# Patient Record
Sex: Female | Born: 1950 | Race: Black or African American | Hispanic: No | Marital: Single | Smoking: Former smoker
Health system: Southern US, Community
[De-identification: ages and names within clinical notes are randomized; demographics above are authoritative.]

## PROBLEM LIST (undated history)

## (undated) DIAGNOSIS — M24576 Contracture, unspecified foot: Secondary | ICD-10-CM

## (undated) DIAGNOSIS — E872 Acidosis, unspecified: Secondary | ICD-10-CM

## (undated) DIAGNOSIS — J189 Pneumonia, unspecified organism: Secondary | ICD-10-CM

## (undated) DIAGNOSIS — F32A Depression, unspecified: Secondary | ICD-10-CM

## (undated) DIAGNOSIS — G35 Multiple sclerosis: Secondary | ICD-10-CM

## (undated) DIAGNOSIS — M6281 Muscle weakness (generalized): Secondary | ICD-10-CM

## (undated) DIAGNOSIS — I639 Cerebral infarction, unspecified: Secondary | ICD-10-CM

## (undated) DIAGNOSIS — E860 Dehydration: Secondary | ICD-10-CM

## (undated) DIAGNOSIS — M24573 Contracture, unspecified ankle: Secondary | ICD-10-CM

## (undated) DIAGNOSIS — E669 Obesity, unspecified: Secondary | ICD-10-CM

## (undated) DIAGNOSIS — L89153 Pressure ulcer of sacral region, stage 3: Secondary | ICD-10-CM

## (undated) DIAGNOSIS — R4701 Aphasia: Secondary | ICD-10-CM

## (undated) DIAGNOSIS — Z931 Gastrostomy status: Secondary | ICD-10-CM

## (undated) DIAGNOSIS — G8194 Hemiplegia, unspecified affecting left nondominant side: Secondary | ICD-10-CM

## (undated) DIAGNOSIS — K219 Gastro-esophageal reflux disease without esophagitis: Secondary | ICD-10-CM

## (undated) DIAGNOSIS — I10 Essential (primary) hypertension: Secondary | ICD-10-CM

## (undated) DIAGNOSIS — F329 Major depressive disorder, single episode, unspecified: Secondary | ICD-10-CM

## (undated) DIAGNOSIS — G894 Chronic pain syndrome: Secondary | ICD-10-CM

## (undated) DIAGNOSIS — R1310 Dysphagia, unspecified: Secondary | ICD-10-CM

## (undated) DIAGNOSIS — D696 Thrombocytopenia, unspecified: Secondary | ICD-10-CM

## (undated) DIAGNOSIS — E039 Hypothyroidism, unspecified: Secondary | ICD-10-CM

## (undated) DIAGNOSIS — R569 Unspecified convulsions: Secondary | ICD-10-CM

## (undated) DIAGNOSIS — R279 Unspecified lack of coordination: Secondary | ICD-10-CM

---

## 1997-11-15 ENCOUNTER — Inpatient Hospital Stay (HOSPITAL_COMMUNITY): Admission: EM | Admit: 1997-11-15 | Discharge: 1997-12-04 | Payer: Self-pay | Admitting: Neurology

## 2001-11-22 ENCOUNTER — Emergency Department (HOSPITAL_COMMUNITY): Admission: EM | Admit: 2001-11-22 | Discharge: 2001-11-22 | Payer: Self-pay | Admitting: Emergency Medicine

## 2003-07-25 ENCOUNTER — Emergency Department (HOSPITAL_COMMUNITY): Admission: EM | Admit: 2003-07-25 | Discharge: 2003-07-25 | Payer: Self-pay | Admitting: Emergency Medicine

## 2003-10-09 ENCOUNTER — Inpatient Hospital Stay (HOSPITAL_COMMUNITY): Admission: EM | Admit: 2003-10-09 | Discharge: 2003-10-14 | Payer: Self-pay | Admitting: *Deleted

## 2004-03-07 ENCOUNTER — Ambulatory Visit (HOSPITAL_COMMUNITY): Payer: Self-pay | Admitting: Neurology

## 2004-03-07 ENCOUNTER — Encounter (HOSPITAL_COMMUNITY): Admission: RE | Admit: 2004-03-07 | Discharge: 2004-03-30 | Payer: Self-pay | Admitting: Neurology

## 2004-04-02 ENCOUNTER — Emergency Department (HOSPITAL_COMMUNITY): Admission: EM | Admit: 2004-04-02 | Discharge: 2004-04-02 | Payer: Self-pay | Admitting: Emergency Medicine

## 2004-06-06 ENCOUNTER — Ambulatory Visit (HOSPITAL_COMMUNITY): Payer: Self-pay | Admitting: Neurology

## 2004-06-06 ENCOUNTER — Encounter (HOSPITAL_COMMUNITY): Admission: RE | Admit: 2004-06-06 | Discharge: 2004-07-06 | Payer: Self-pay | Admitting: Neurology

## 2004-09-07 ENCOUNTER — Encounter (HOSPITAL_COMMUNITY): Admission: RE | Admit: 2004-09-07 | Discharge: 2004-10-07 | Payer: Self-pay | Admitting: Neurology

## 2004-09-07 ENCOUNTER — Ambulatory Visit (HOSPITAL_COMMUNITY): Payer: Self-pay | Admitting: Neurology

## 2004-11-03 ENCOUNTER — Encounter (HOSPITAL_COMMUNITY): Admission: RE | Admit: 2004-11-03 | Discharge: 2004-12-03 | Payer: Self-pay | Admitting: Neurology

## 2004-11-03 ENCOUNTER — Ambulatory Visit (HOSPITAL_COMMUNITY): Payer: Self-pay | Admitting: Neurology

## 2004-11-04 ENCOUNTER — Ambulatory Visit (HOSPITAL_COMMUNITY): Admission: RE | Admit: 2004-11-04 | Discharge: 2004-11-04 | Payer: Self-pay | Admitting: Neurology

## 2004-11-04 ENCOUNTER — Ambulatory Visit: Payer: Self-pay | Admitting: *Deleted

## 2004-12-08 ENCOUNTER — Encounter (HOSPITAL_COMMUNITY): Admission: RE | Admit: 2004-12-08 | Discharge: 2004-12-23 | Payer: Self-pay | Admitting: Neurology

## 2005-02-03 ENCOUNTER — Encounter (HOSPITAL_COMMUNITY): Admission: RE | Admit: 2005-02-03 | Discharge: 2005-03-05 | Payer: Self-pay | Admitting: Oncology

## 2005-02-03 ENCOUNTER — Ambulatory Visit (HOSPITAL_COMMUNITY): Payer: Self-pay | Admitting: Neurology

## 2007-07-26 ENCOUNTER — Encounter (INDEPENDENT_AMBULATORY_CARE_PROVIDER_SITE_OTHER): Payer: Self-pay | Admitting: Family Medicine

## 2007-07-26 ENCOUNTER — Inpatient Hospital Stay (HOSPITAL_COMMUNITY): Admission: EM | Admit: 2007-07-26 | Discharge: 2007-07-29 | Payer: Self-pay | Admitting: Emergency Medicine

## 2007-07-30 ENCOUNTER — Observation Stay (HOSPITAL_COMMUNITY): Admission: EM | Admit: 2007-07-30 | Discharge: 2007-07-31 | Payer: Self-pay | Admitting: Emergency Medicine

## 2007-09-09 ENCOUNTER — Inpatient Hospital Stay (HOSPITAL_COMMUNITY): Admission: EM | Admit: 2007-09-09 | Discharge: 2007-09-12 | Payer: Self-pay | Admitting: Emergency Medicine

## 2008-03-17 ENCOUNTER — Inpatient Hospital Stay (HOSPITAL_COMMUNITY): Admission: EM | Admit: 2008-03-17 | Discharge: 2008-03-19 | Payer: Self-pay | Admitting: Emergency Medicine

## 2008-05-26 ENCOUNTER — Emergency Department (HOSPITAL_COMMUNITY): Admission: EM | Admit: 2008-05-26 | Discharge: 2008-05-26 | Payer: Self-pay | Admitting: Emergency Medicine

## 2008-08-31 ENCOUNTER — Inpatient Hospital Stay (HOSPITAL_COMMUNITY): Admission: EM | Admit: 2008-08-31 | Discharge: 2008-09-04 | Payer: Self-pay | Admitting: Emergency Medicine

## 2009-04-08 ENCOUNTER — Ambulatory Visit (HOSPITAL_COMMUNITY): Admission: RE | Admit: 2009-04-08 | Discharge: 2009-04-08 | Payer: Self-pay | Admitting: Neurology

## 2009-08-02 ENCOUNTER — Emergency Department (HOSPITAL_COMMUNITY): Admission: EM | Admit: 2009-08-02 | Discharge: 2009-08-03 | Payer: Self-pay | Admitting: Emergency Medicine

## 2010-04-17 ENCOUNTER — Encounter: Payer: Self-pay | Admitting: Internal Medicine

## 2010-07-04 LAB — BLOOD GAS, ARTERIAL
Acid-Base Excess: 4.1 mmol/L — ABNORMAL HIGH (ref 0.0–2.0)
Bicarbonate: 28.4 mEq/L — ABNORMAL HIGH (ref 20.0–24.0)
Patient temperature: 37
Patient temperature: 37
TCO2: 25.4 mmol/L (ref 0–100)
pCO2 arterial: 45.8 mmHg — ABNORMAL HIGH (ref 35.0–45.0)
pH, Arterial: 7.409 — ABNORMAL HIGH (ref 7.350–7.400)
pO2, Arterial: 98.2 mmHg (ref 80.0–100.0)

## 2010-07-04 LAB — COMPREHENSIVE METABOLIC PANEL
ALT: 20 U/L (ref 0–35)
ALT: 34 U/L (ref 0–35)
AST: 46 U/L — ABNORMAL HIGH (ref 0–37)
Albumin: 2.8 g/dL — ABNORMAL LOW (ref 3.5–5.2)
Alkaline Phosphatase: 48 U/L (ref 39–117)
Alkaline Phosphatase: 78 U/L (ref 39–117)
BUN: 13 mg/dL (ref 6–23)
CO2: 30 mEq/L (ref 19–32)
CO2: 32 mEq/L (ref 19–32)
Calcium: 8.8 mg/dL (ref 8.4–10.5)
Calcium: 9.7 mg/dL (ref 8.4–10.5)
Chloride: 104 mEq/L (ref 96–112)
Chloride: 112 mEq/L (ref 96–112)
Creatinine, Ser: 0.83 mg/dL (ref 0.4–1.2)
GFR calc Af Amer: >60 mL/min (ref >60)
GFR calc non Af Amer: >60 mL/min (ref >60)
Glucose, Bld: 100 mg/dL — ABNORMAL HIGH (ref 70–99)
Glucose, Bld: 86 mg/dL (ref 70–99)
Potassium: 3.8 mEq/L (ref 3.5–5.1)
Potassium: 4 mEq/L (ref 3.5–5.1)
Sodium: 140 mEq/L (ref 135–145)
Sodium: 145 mEq/L (ref 135–145)
Total Bilirubin: 0.2 mg/dL — ABNORMAL LOW (ref 0.3–1.2)
Total Bilirubin: 0.4 mg/dL (ref 0.3–1.2)
Total Protein: 5.7 g/dL — ABNORMAL LOW (ref 6.0–8.3)
Total Protein: 6.3 g/dL (ref 6.0–8.3)

## 2010-07-04 LAB — CBC
HCT: 33.5 % — ABNORMAL LOW (ref 36.0–46.0)
Hemoglobin: 11.3 g/dL — ABNORMAL LOW (ref 12.0–15.0)
Hemoglobin: 14.1 g/dL (ref 12.0–15.0)
MCHC: 34.8 g/dL (ref 30.0–36.0)
MCHC: 35.1 g/dL (ref 30.0–36.0)
MCHC: 35.2 g/dL (ref 30.0–36.0)
MCV: 91.8 fL (ref 78.0–100.0)
MCV: 92 fL (ref 78.0–100.0)
MCV: 92.4 fL (ref 78.0–100.0)
Platelets: 71 10*3/uL — ABNORMAL LOW (ref 150–400)
Platelets: 72 10*3/uL — ABNORMAL LOW (ref 150–400)
RBC: 3.42 MIL/uL — ABNORMAL LOW (ref 3.87–5.11)
RBC: 3.5 MIL/uL — ABNORMAL LOW (ref 3.87–5.11)
RBC: 4.38 MIL/uL (ref 3.87–5.11)
RDW: 14.4 % (ref 11.5–15.5)
WBC: 4.3 10*3/uL (ref 4.0–10.5)
WBC: 6.9 10*3/uL (ref 4.0–10.5)

## 2010-07-04 LAB — DIFFERENTIAL
Basophils Relative: 0 % (ref 0–1)
Basophils Relative: 0 % (ref 0–1)
Eosinophils Absolute: 0.2 10*3/uL (ref 0.0–0.7)
Eosinophils Absolute: 0.9 10*3/uL — ABNORMAL HIGH (ref 0.0–0.7)
Eosinophils Relative: 23 % — ABNORMAL HIGH (ref 0–5)
Eosinophils Relative: 5 % (ref 0–5)
Lymphocytes Relative: 32 % (ref 12–46)
Lymphs Abs: 1.3 10*3/uL (ref 0.7–4.0)
Lymphs Abs: 1.9 10*3/uL (ref 0.7–4.0)
Lymphs Abs: 2.1 10*3/uL (ref 0.7–4.0)
Monocytes Absolute: 0.7 10*3/uL (ref 0.1–1.0)
Monocytes Absolute: 0.9 10*3/uL (ref 0.1–1.0)
Monocytes Relative: 20 % — ABNORMAL HIGH (ref 3–12)
Neutro Abs: 1.5 10*3/uL — ABNORMAL LOW (ref 1.7–7.7)
Neutro Abs: 3.4 10*3/uL (ref 1.7–7.7)
Neutrophils Relative %: 49 % (ref 43–77)

## 2010-07-04 LAB — CULTURE, BLOOD (ROUTINE X 2)
Culture: NO GROWTH
Report Status: 6122010
Report Status: 6122010

## 2010-07-04 LAB — BASIC METABOLIC PANEL
BUN: 3 mg/dL — ABNORMAL LOW (ref 6–23)
BUN: 5 mg/dL — ABNORMAL LOW (ref 6–23)
CO2: 28 mEq/L (ref 19–32)
CO2: 30 mEq/L (ref 19–32)
Calcium: 8.4 mg/dL (ref 8.4–10.5)
Chloride: 109 mEq/L (ref 96–112)
Chloride: 109 mEq/L (ref 96–112)
Creatinine, Ser: 0.6 mg/dL (ref 0.4–1.2)
GFR calc Af Amer: >60 mL/min (ref >60)
GFR calc non Af Amer: >60 mL/min (ref >60)
Glucose, Bld: 117 mg/dL — ABNORMAL HIGH (ref 70–99)
Potassium: 3.6 mEq/L (ref 3.5–5.1)
Sodium: 141 mEq/L (ref 135–145)

## 2010-07-04 LAB — VALPROIC ACID LEVEL: Valproic Acid Lvl: 92 ug/mL (ref 50.0–100.0)

## 2010-07-07 LAB — CBC
HCT: 41.2 % (ref 36.0–46.0)
MCHC: 33.6 g/dL (ref 30.0–36.0)
MCV: 92.9 fL (ref 78.0–100.0)
Platelets: 146 10*3/uL — ABNORMAL LOW (ref 150–400)
RDW: 14.6 % (ref 11.5–15.5)

## 2010-07-07 LAB — DIFFERENTIAL
Basophils Absolute: 0 10*3/uL (ref 0.0–0.1)
Eosinophils Absolute: 0 10*3/uL (ref 0.0–0.7)
Eosinophils Relative: 1 % (ref 0–5)
Monocytes Absolute: 0.2 10*3/uL (ref 0.1–1.0)

## 2010-07-07 LAB — STOOL CULTURE

## 2010-07-07 LAB — CLOSTRIDIUM DIFFICILE EIA: C difficile Toxins A+B, EIA: NEGATIVE

## 2010-07-07 LAB — PHENYTOIN LEVEL, TOTAL: Phenytoin Lvl: 24.3 ug/mL — ABNORMAL HIGH (ref 10.0–20.0)

## 2010-07-07 LAB — OVA AND PARASITE EXAMINATION

## 2010-07-07 LAB — BASIC METABOLIC PANEL
BUN: 10 mg/dL (ref 6–23)
CO2: 27 mEq/L (ref 19–32)
Chloride: 104 mEq/L (ref 96–112)
Glucose, Bld: 134 mg/dL — ABNORMAL HIGH (ref 70–99)
Potassium: 4.7 mEq/L (ref 3.5–5.1)

## 2010-07-14 ENCOUNTER — Emergency Department (HOSPITAL_COMMUNITY): Payer: Medicare Other

## 2010-07-14 ENCOUNTER — Emergency Department (HOSPITAL_COMMUNITY)
Admission: EM | Admit: 2010-07-14 | Discharge: 2010-07-15 | Disposition: A | Discharge status: Home / Self Care | Payer: Medicare Other | Attending: Emergency Medicine | Admitting: Emergency Medicine

## 2010-07-14 DIAGNOSIS — F0390 Unspecified dementia without behavioral disturbance: Secondary | ICD-10-CM | POA: Insufficient documentation

## 2010-07-14 DIAGNOSIS — R569 Unspecified convulsions: Secondary | ICD-10-CM | POA: Insufficient documentation

## 2010-07-14 DIAGNOSIS — I10 Essential (primary) hypertension: Secondary | ICD-10-CM | POA: Insufficient documentation

## 2010-07-14 DIAGNOSIS — G35 Multiple sclerosis: Secondary | ICD-10-CM | POA: Insufficient documentation

## 2010-07-14 DIAGNOSIS — I69959 Hemiplegia and hemiparesis following unspecified cerebrovascular disease affecting unspecified side: Secondary | ICD-10-CM | POA: Insufficient documentation

## 2010-07-14 DIAGNOSIS — E039 Hypothyroidism, unspecified: Secondary | ICD-10-CM | POA: Insufficient documentation

## 2010-07-14 LAB — DIFFERENTIAL
Eosinophils Relative: 2 % (ref 0–5)
Lymphocytes Relative: 24 % (ref 12–46)
Lymphs Abs: 1.5 10*3/uL (ref 0.7–4.0)
Monocytes Absolute: 0.7 10*3/uL (ref 0.1–1.0)
Neutro Abs: 3.8 10*3/uL (ref 1.7–7.7)

## 2010-07-14 LAB — CBC
HCT: 39.2 % (ref 36.0–46.0)
Hemoglobin: 12.7 g/dL (ref 12.0–15.0)
MCHC: 32.4 g/dL (ref 30.0–36.0)
MCV: 93.6 fL (ref 78.0–100.0)
RDW: 13.9 % (ref 11.5–15.5)
WBC: 6.1 10*3/uL (ref 4.0–10.5)

## 2010-07-14 LAB — BASIC METABOLIC PANEL
BUN: 14 mg/dL (ref 6–23)
CO2: 24 mEq/L (ref 19–32)
GFR calc non Af Amer: >60 mL/min (ref >60)
Glucose, Bld: 103 mg/dL — ABNORMAL HIGH (ref 70–99)
Potassium: 3.6 mEq/L (ref 3.5–5.1)
Sodium: 140 mEq/L (ref 135–145)

## 2010-07-15 LAB — URINALYSIS, ROUTINE W REFLEX MICROSCOPIC
Glucose, UA: NEGATIVE mg/dL
Ketones, ur: 15 mg/dL — AB
Nitrite: NEGATIVE
Protein, ur: NEGATIVE mg/dL
pH: 5.5 (ref 5.0–8.0)

## 2010-07-15 LAB — URINE MICROSCOPIC-ADD ON

## 2010-08-09 NOTE — Consult Note (Signed)
NAME:  [name], [name]                 ACCOUNT NO.:  [id number]   MEDICAL RECORD NO.:  [id number]          PATIENT TYPE:  INP   LOCATION:  A215                          FACILITY:  [facility]   PHYSICIAN:  Kofi A. Gerilyn Pilgrim, M.D. DATE OF BIRTH:  16-Nov-1950   DATE OF CONSULTATION:  07/26/2007  DATE OF DISCHARGE:                                 CONSULTATION   REASON FOR CONSULTATION:  Seizure.   HISTORY:  This is a 60 year old black female who is known to my service  because of severe sequelae from multiple sclerosis.  She has developed  seizures few years ago and was placed on Keppra with excellent control.  At the baseline, the patient had severe deficits with the patient  requiring complete care for all her ADLs.  She essentially is mute, but  she does follow commands and is usually awake and alert.  She has had  significant weakness in all 4 extremities, but has responded recently to  MS medication and had improved strength of upper extremities of 3-4/5.  Her legs have always been plegic.  It appears that the patient had a  breakthrough seizure and was postictal.  She was given a gram of Keppra  and also had that in the emergency room.   PAST MEDICAL HISTORY:  1. Severe sequelae for multiple sclerosis.  2. Epilepsy.  3. Hypothyroidism.  4. Depression.  5. Hypertension.   ADMISSION MEDICATION:  1. Keppra 1000 mg b.i.d.  2. Synthroid 100 mcg daily.  3. Aspirin 81 mg daily.  4. Multivitamins.  5. Avonex 30 mcg IM per week.  6. Lexapro 10 mg daily.  7. MiraLax 17 g daily.  8. Zenapax 2 mg at bedtime.  9. Zantac.  10.Baclofen 20 mg q.i.d.   ALLERGIES:  None known.   SURGICAL HISTORY:  None.   SOCIAL HISTORY:  Lives in a nursing home facility.   PHYSICAL EXAMINATION:  VITAL SIGNS:  Temperature 98.7, pulse rate 136,  respirations 20, and blood pressure 119/72.  GENERAL:  The patient is lying in bed with eyes closed, but opens her  eyes spontaneously without any stimuli.  She does  not recognize who I am  and follows commands on the right side.  She also follows axial  commands.  She is mute, which is her baseline.  NEUROLOGIC:  Cranial nerve evaluation.  She does have full extraocular  movements.  Pupils are 5 mm, but brisk and reactive.  Face and muscle  strength appears to be symmetric.  She appears to have visual fields  intact.  On motor examination, again she has 4/5 strength involving the  right upper extremity.  She is not moving the left side of the leg,  being plegic is her baseline.  The left upper extremity, however, seems  to have worsened after the seizure.  Her reflexes are brisk throughout,  and she does have increased tone throughout.  Her CT scan shows nothing  acute or severe, white matter disease, and atrophy.  Carotid Doppler  shows no plaque or stenosis.   Sodium 140, potassium 3.8, chloride 106, CO2  of 27, BUN 10, creatinine  0.8, glucose 148, calcium 90.2, ammonia 15, TSH 3.3, B12 level 895, and  homocystine 4.8.  WBC 7.5, hemoglobin 12.9, and platelets 208.   ASSESSMENT:  1. Breakthrough seizure in a lady who was previously very well      controlled.  The Keppra that the patient has been taken has      recently gone generic, I am suspicious that she has probably taken      the generic and probably not absorbing it as well as the brand.  2. Severe sequelae from multiple sclerosis.  3. Postictal lethargy/encephalopathy.  I expect this to improve.   RECOMMENDATIONS:  She has an EEG ordered.  We are going to go ahead and  increase her daily dose of Keppra/generic levetiracetam to by 500 mg per  day and see how she does.      Kofi A. Gerilyn Pilgrim, M.D.  Electronically Signed     KAD/MEDQ  D:  07/26/2007  T:  07/27/2007  Job:  [id number]

## 2010-08-09 NOTE — Consult Note (Signed)
NAME:  [name], PARILLA                 ACCOUNT NO.:  [id number]   MEDICAL RECORD NO.:  [id number]          PATIENT TYPE:  INP   LOCATION:  A315                          FACILITY:  [facility]   PHYSICIAN:  Kofi A. Gerilyn Pilgrim, M.D. DATE OF BIRTH:  [birth year]   DATE OF CONSULTATION:  07/30/2007  DATE OF DISCHARGE:  07/31/2007                                 CONSULTATION   The patient is a 60 year old lady who is just admitted to the hospital  for breakthrough seizure.  It appears that she was sent and had a flurry  of seizures; according to the family, was seizing for 30 minutes on  coming to presentation to the hospital.  It appears that she may have  had some seizure at that time.  The patient was given Ativan.  It  appears that she apparently was given p.o. Dilantin at a nursing  facility.  The patient previously was very well controlled on some brand  name Keppra.  She has had previous events of status epilepticus on  Dilantin, essentially subtherapeutic Dilantin, which she has had a hard  time absorbing.   PAST MEDICAL HISTORY:  Severe complication from MS, epilepsy, obesity,  GERD, depression, hypertension, and hypothyroidism.   ADMISSION MEDICATIONS:  Synthroid, Keppra 1000 mg in the morning and 250  mg at the nighttime, aspirin, multivitamin, Lovenox, Lopressor, MiraLax,  Zanaflex, Zantac, Tylenol, and baclofen.   ALLERGIES:  None known.   SOCIAL HISTORY:  Again, she has a supportive family.  She resides at a  nursing home facility.   PHYSICAL EXAMINATION:  The patient did have a low-grade fever at  presentation of 100.1, pulse 83, respirations 24, and blood pressure  107/69.  The patient lays in bed with eyes closed.  She actually opens  her eyes to name calling.  She does not speak, however.  HEENT EVALUATION:  Neck is supple.  Head is normocephalic and  atraumatic.  ABDOMEN:  Soft.  EXTREMITIES:  No significant edema.  CRANIAL NERVES EVALUATION:  She seemed to deviate her eyes  towards the  left, but does have full extraocular movements.  Facial muscle strength  is symmetric.  MOTOR EXAMINATION:  She does lift the right upper extremity to command,  again the strength is forced, does have increased tone there.  She is  plegic in the others extremities, which is her baseline.  Reflexes are  pathologic, brisk throughout, 3+.   LABORATORY EVALUATION:  TSH is 3.4.  Urinalysis is negative.  INR 1 and  PT 13.  Sodium 138, potassium 3.7, chloride 103, CO2 26, glucose 100,  BUN 7, and creatinine 0.8.  Liver enzymes normal.  WBC is 6, hemoglobin  13, and platelet count of 190.   ASSESSMENT:  1. A lady who presents with flurry of seizures, query status      epilepticus.  It is somewhat amazing that she was well controlled      previously, and now, just out of the blue, she seemed to be having      a bunch of seizures.  One to wonder is if the  switch to generic      Keppra could be the culprit.  2. Severe complications from multiple sclerosis.  3. Hypertension.   RECOMMENDATIONS:  We are going to increase her Keppra to 1500 b.i.d.  I  would probably have her use a branded formulation only.  I think she  needs a second agent in additional to the increase in the Keppra, we  will have her take Depakote 750 mg a day.  We will repeat her neuro exam  and we will watch her fever.  I suspect that her fever is likely due to  complication from her seizure.      Kofi A. Gerilyn Pilgrim, M.D.  Electronically Signed     KAD/MEDQ  D:  07/31/2007  T:  08/01/2007  Job:  [id number]

## 2010-08-09 NOTE — Procedures (Signed)
NAME:  [name], [name]                 ACCOUNT NO.:  [id number]   MEDICAL RECORD NO.:  [id number]          PATIENT TYPE:  INP   LOCATION:  A334                          FACILITY:  [facility]   PHYSICIAN:  Kofi A. Gerilyn Pilgrim, M.D. DATE OF BIRTH:  11-Dec-1950   DATE OF PROCEDURE:  09/11/2007  DATE OF DISCHARGE:  09/12/2007                              EEG INTERPRETATION   This is a 56-year lady who presents with status epilepticus.   MEDICATIONS:  Synthroid, aspirin, Avonex, Lexapro, Depakote, MiraLax,  Keppra, Dilantin, Zanaflex, baclofen, Lovenox, Ativan.   ANALYSIS:  A 16-channel recording is conducted using standard 10, 20  measurements.  The test is conducted for 24 minutes.  Most of the  recording is obtained during sleep with status slowing to 5 Hz.  Additionally, sleep spindles are observed.  She does, however, have  background activity when she wakes of 7 Hz.  There is no focal or  lateralized slowing.  No epileptiform activities observed.  Photic  stimulation is carried out without significant changes in the background  activity.   IMPRESSION:  Mild generalized slowing.  There is no epileptiform  activity observed.      Kofi A. Gerilyn Pilgrim, M.D.  Electronically Signed     KAD/MEDQ  D:  09/12/2007  T:  09/13/2007  Job:  [id number]

## 2010-08-09 NOTE — Consult Note (Signed)
NAME:  [name], [name]                 ACCOUNT NO.:  [id number]   MEDICAL RECORD NO.:  [id number]           PATIENT TYPE:  INP   LOCATION:  A334                          FACILITY:  [facility]   PHYSICIAN:  Kofi A. Gerilyn Pilgrim, M.D. DATE OF BIRTH:  01-22-1951   DATE OF CONSULTATION:  DATE OF DISCHARGE:                                 CONSULTATION   REASON FOR CONSULTATION:  Seizure.   This is a 60 year old African American female who is known to my  service.  She has been admitted to the hospital unfortunately multiple  times for status epilepticus.  She has a baseline history of multiple  sclerosis.  The patient stays at [facility]  In  the past, she had been on generic Dilantin, which she seemed not to  absorb well and has presented with status epilepticus.  The patient was  placed on Keppra and has achieved seizure freedom for 3 years.  Unfortunately, since the medication has been switched to generic, she  has been admitted to the hospital twice due to status epilepticus.  On  the last visit, she was given Depakote tablets, however, presented to  the hospital with multiple seizures.  On this admission, her Depakote  level was undetective.  Keppra level was 5.3, which is on the lower end  for the dosage she is taking.  She was loaded with Dilantin after having  multiple seizures and also given Ativan.  I did call the nursing home  facility to make sure there was an issue with administration of the  medication.  Nurse indicates that the patient does not have prompt  swallowing for reasons unknown.  She was changed from tablet formulation  of Depakote to liquid formulation and was taking as b.i.d.  Because of  the liquid formulation, it is not meant for b.i.d. dose and needs to be  taken 3 times a day and this clearly would explain why her level was  undetective.  She has also possibly taken generic Keppra, but this is  not well documented.   PAST MEDICAL HISTORY:  1.  Seizure disorder.  2. Multiple sclerosis.  3. Hypothyroidism.  4. Epilepsy.  5. Depression.  6. Hypertension.   ADMISSION MEDICATIONS:  1. Keppra 1500 mg b.i.d.  2. Synthroid 75 mg micrograms daily.  3. Aspirin 81 mg daily.  4. Lovenox 30 mcg IM weekly.  5. Lexapro 10 mg daily.  6. MiraLax 17 g b.i.d.  7. Zanaflex 2 mg nightly.  8. Zantac 10 mg nightly.  9. Acetaminophen.  10.Baclofen 20 mg q.i.d.  11.Depakote 250 mg in the morning and 500 mg in the p.m.  12.It appears that she is also on Dilantin Infatabs 200 mg b.i.d.   ALLERGIES:  None known.   SOCIAL HISTORY:  Lives in [facility].  The patient's  sisters [name] and Lucretia Roers are her power of attorneys.   PHYSICAL EXAMINATION:  GENERAL:  This is an obese lady who is lying in  bed.  Eyes are rotated to the right with head turned to the right.  VITAL  SIGNS:  Temperature 97.9, pulse 86, respirations 20, and blood  pressure 126/65.  HEENT:  Head is turning to the right with forced deviation with pupils  toward the right.  She does have some automatistic-type behavior of the  right upper extremity.  Corneal reflexes are preserved.  Pupillary  reflexes are also reactive.  She thus seems to have full extraocular  movements passively.  The patient does not follow commands.  She does  have spontaneous eye opening.  Again, her eyes are deviated towards the  right.  MOTOR EXAMINATION:  She does move the right upper extremity  spontaneously.  Left side is 1/5.  Legs are 0-5.  She has increased tone  bilaterally.  Reflexes are brisk bilaterally.   Dilantin level undetective.  Depakote level undetective.  Keppra level  5.3.  Sodium 141, potassium 3.6, chloride 110, CO2 23, BUN 10,  creatinine 1.3, glucose 140, WBC 10, hemoglobin 14, and platelet count  of 221.   IMPRESSION:  Status epilepticus with recurrent seizure.  She appears to  still be having complex partial type seizures with head turned towards  the right,  forced eye deviation, and movements of the right upper  extremity.  This case is extremely frustrating as the patient seems  present particularly with multiple seizures, i.e. status epilepticus,  because she seems not to be under the right regimen of medication.  This  seems all the recommendation made by Neurological Services seems not to  be carried out once she released from the hospital.  There are several  factors; medications have been changed by possible other physicians or  pharmacist without neurological consultation.  I believe this patient  can achieve seizure freedom if she sticks on the branded medications  that she has been given, but this is going to require close  coordination.  I think adding the third medication to her regimen is  only going to create more confusion.  I would not suggest this.  I think  we should stick with 2 medications and use them properly with neurology  supervision.  I would recommend that we stick with Keppra branded only  and probably can increase the dose to 4 g a day.  She is to take  Depakote now stick with brand name only and not use the liquid  formulation.  If she has problem swallowing the Depakote then we can  make other changes by speaking with the nurse from the nursing home  facility at [facility].  They made no indication that she had problem  swallowing her medication.  Therefore, she ought to get her medicines as  written.  We can increase the Depakote to 500 mg b.i.d. and monitor her  closely.  We probably should obtain EEG to make sure that she is not  having nonconvulsive  status epilepticus.      Kofi A. Gerilyn Pilgrim, M.D.  Electronically Signed     KAD/MEDQ  D:  09/10/2007  T:  09/11/2007  Job:  [id number]

## 2010-08-09 NOTE — H&P (Signed)
NAME:  [name], [name] NO.:  [id number]   MEDICAL RECORD NO.:  [id number]          PATIENT TYPE:  INP   LOCATION:  A315                          FACILITY:  [facility]   PHYSICIAN:  Dorris Singh, DO    DATE OF BIRTH:  March 04, 1951   DATE OF ADMISSION:  07/30/2007  DATE OF DISCHARGE:  LH                              HISTORY & PHYSICAL   CHIEF COMPLAINT:  Seizures.   The patient was discharged on Jul 29, 2007 back to [facility].  Apparently, when she was at the nursing home, she started to have a  seizure.  It was reported at the nursing home that she had a seizure for  about 1 hour prior to discharge. When she arrived, she had been treated  with Ativan.  It helped, but she was still postictal.  We had increased  her medications to Keppra 1500 at bedtime and 1000 a.m.  However, what  was reported to the family is that she had been seizing for  approximately 1 hour.  At the time of admission, she is a caveat level  of 5.   PAST MEDICAL HISTORY:  1. Hypertension.  2. Cerebrovascular disease.  3. Depression.  4. Multiple sclerosis.  5. GERD.  6. Obesity.  7. Seizure disorder.  8. Hypothyroidism.   CURRENT MEDICATIONS:  1. Keppra 1000 mg in the morning and 1500 mg at night.  2. Synthroid 1000 mcg once a day.  3. Aspirin 81 mg daily.  4. Multivitamin daily.  5. Avenox IM 30 mcg per week.  6. Lexapro 10 mg daily.  7. MiraLax 17 g daily.  8. Zanaflex 2 mg at bedtime.  9. Zantac 1500 mg at bedtime.  10.Tylenol 350 mg as needed.  11.Baclofen 4 times a day.   ALLERGIES:  No known drug allergies.   SOCIAL HISTORY:  Currently, the patient lives in a nursing home, where  she is cared for there over at [facility].  She has two sisters, [name]  and [name], who are her power of attorney.   PHYSICAL EXAMINATION:  VITAL SIGNS:  Currently, the patient's vitals are  as follows:  Her blood pressure is 107/69, temperature 100.1, pulse 83,  respirations 24.  GENERAL:  The  patient is a 60 year old African American female who  currently is not responsive, is in her postictal state.  Having just  seen and discharged the patient, I know this is not her baseline.  HEENT:  Eyes:  EOMI.  There are not any teeth.  Head is normocephalic,  atraumatic.  NECK:  Not distended.  LUNGS:  Clear.  No wheezing, rales, or rhonchi.  HEART:  Regular rate and rhythm.  No gallops, rubs, or murmurs.  ABDOMEN:  Soft, nontender, nondistended.  EXTREMITIES:  She has +1 minimal edema.  No clubbing or cyanosis.  NEUROLOGIC:  She has left hemiparesis from what was known before, and  she is currently postictal as well.   RADIOLOGY REPORTS:  She had a chest x-ray done today which showed  platelike atelectasis versus scarring of the left lung base, mild volume  loss  in the right lung base.  No definite pneumonia or significant  pleural effusion.   LABORATORIES THAT WERE DONE:  CBC:  White count 6.6, hemoglobin 13,  hematocrit 37, platelet count 190.  Her sodium is 138, potassium 3.7,  chloride 103, CO2 26, glucose 100, BUN 7, and creatinine 0.86.  Her  urine was negative.   ASSESSMENT AND PLAN:  1. Breakthrough seizures.  The patient was previously controlled.  2. History of multiple sclerosis.  The patient is mute..  3. Hyperthyroidism.   PLAN:  To admit the patient to the service of [facility].  Will put her  on a general medicine floor with seizure precautions and neuro checks  every 2 hours x the first 24 hours.  Will also consult Dr. Gerilyn Pilgrim,  whom I have talked to regarding the patient, who recommended the  increases in her Keppra and starting her Depakote.  Also, we have Ativan  ordered in case the patient continues to seize, until she is therapeutic  on these medications.  Will also obtain an EEG for her as well.  We will  continue to monitor her, and once she is able to eat we will place her  on her proper diet.  I have spoken with the power of attorney today  regarding  the plan of care, and she states an understanding.      Dorris Singh, DO  Electronically Signed     CB/MEDQ  D:  07/30/2007  T:  07/30/2007  Job:  [id number]

## 2010-08-09 NOTE — H&P (Signed)
NAME:  [name], [name] NO.:  [id number]   MEDICAL RECORD NO.:  [id number]          PATIENT TYPE:  INP   LOCATION:  A334                          FACILITY:  [facility]   PHYSICIAN:  Gardiner Barefoot, MD    DATE OF BIRTH:  November 12, 1950   DATE OF ADMISSION:  09/09/2007  DATE OF DISCHARGE:  LH                              HISTORY & PHYSICAL   PRIMARY CARE PHYSICIAN:  Unassigned   HISTORY OF PRESENT ILLNESS:  This is a 60 year old African American  female with a history of multiple sclerosis and epilepsy and recurrent  seizures over the last several months which were previously controlled  on Keppra.  Of note, it does correlate initially from a change from a  brand Keppra to a generic Keppra.  The patient presents with recurrent  seizures at the nursing home today where she was given Ativan; however,  continued to have continued seizures and was brought into the emergency  room.  In the emergency room, the patient also had continued seizures  and was started on Dilantin as well as receiving Ativan.   PAST MEDICAL HISTORY:  1. Seizure disorder.  2. Multiple sclerosis.  3. Hypothyroidism.  4. Epilepsy.  5. Depression.  6. Hypertension.   MEDICATIONS:  1. Keppra 1500 mg b.i.d.  2. Synthroid 75 mcg daily.  3. Aspirin 81 mg daily.  4. Avonex IM 30 mcg every Tuesday.  5. Lexapro 10 mg daily.  6. MiraLax 17 grams b.i.d.  7. Zanaflex 2 mg q.h.s.  8. Zantac 10 mg q.h.s.  9. Acetaminophen as needed.  10.Baclofen 20 mg q.i.d.  11.Certagen one tablet every day.  12.Keppra 1000 mg in the a.m. and 1500 mg p.m.  13.Depakote 250 mg in the a.m. and 500 mg in p.m.  14.Dilantin 200 mg of Infatabs b.i.d.   ALLERGIES:  NO KNOWN DRUG ALLERGIES.   SOCIAL HISTORY:  The patient lives in a nursing home and her power of  attornies are her sisters [name] and [name].   FAMILY HISTORY:  Unobtainable from the patient at this time.   REVIEW OF SYSTEMS:  Negative except as per history  present illness.   PHYSICAL EXAMINATION:  VITALS:  Pulses 111, blood pressure 145/82 and O2  sats 100%.  GENERAL:  The patient is sleeping and in a postictal state.  CARDIOVASCULAR:  Tachycardiac with regular rhythm.  No murmurs, rubs or  gallops.  LUNGS:  Clear to auscultation bilaterally.  ABDOMEN:  Obese, nontender, nondistended.  Positive bowel sounds.  No  hepatosplenomegaly.  EXTREMITIES:  Without edema.   LABORATORY VALUES:  Include a valproic acid level of less than 10.  Dilantin less than 2.5.  CBC with WBC of 10, hemoglobin 12, platelets  221, sodium 141, potassium 0.6, chloride 110, bicarb 23, BUN 10,  creatinine 1.27, glucose 140.   ASSESSMENT/PLAN:  1. Recurrent seizures.  The patient is postictal at this time,      although her baseline is much more alert per previous notes.  Of      note, the patient's blood levels for valproic acid and  Dilantin      both were undetectable which may be the cause of these seizures at      this time, not having an adequate dose of her medications.  I      wonder if the patient has some difficulty absorbing her medications      or if she has been refusing.  The patient did receive a loading      dose of Dilantin in the emergency room and will continue with her      Dilantin as well as the Depakote with her Keppra.  Will also      consult Neurology in the a.m..  2. Hypothyroidism.  Will continue with the patient's Synthroid.      Gardiner Barefoot, MD  Electronically Signed     RWC/MEDQ  D:  09/09/2007  T:  09/10/2007  Job:  [id number]

## 2010-08-09 NOTE — H&P (Signed)
NAME:  [name], [name] NO.:  [id number]   MEDICAL RECORD NO.:  [id number]          PATIENT TYPE:  INP   LOCATION:  IC12                          FACILITY:  [facility]   PHYSICIAN:  Margaretmary Dys, M.D.DATE OF BIRTH:  Aug 06, 1950   DATE OF ADMISSION:  08/31/2008  DATE OF DISCHARGE:  LH                              HISTORY & PHYSICAL   ADMISSION DIAGNOSES:  1. Probable aspiration pneumonitis versus healthcare-associated      pneumonia.  2. Altered mental status.  3. History of multiple sclerosis.  4. Probable mild dehydration.  5. Transient hypoxemic respiratory failure likely secondary to      aspiration event.   HISTORY OF PRESENT ILLNESS:  [name] is a 60 year old female who  lives in a nursing home.  The patient was brought in because of cough.  The patient was unable to really give me a history as she was somewhat  lethargic.  The patient will follow some commands, though.  She was  brought in because of altered mental status and dementia.  The patient  also had been being fed with Ensure milk when she suddenly choked with  it and started coughing.  The patient was noted to have had a low oxygen  saturation and low blood pressure, although the exact values were not  known.  When the patient arrived here, the EMS reported that the patient  was not hypoxic and not hypotensive when they arrived here.  No further  history is obtained from the patient.   REVIEW OF SYSTEMS:  A 10-point review of systems is as mentioned in  history of present illness or not obtainable due to the patient's mental  status.   PAST MEDICAL HISTORY:  1. Seizure activity.  2. History of multiple sclerosis with chronic aphasia.  3. History of depression.  4. History of hypothyroidism.   MEDICATIONS:  Based on the medications from her last discharge summary  back in December 2009.  These medications were listed:  1. Aspirin 81 mg once a day.  2. Avonex 30 mcg intramuscularly every  Tuesday.  3. Lexapro 10 mg p.o. once a day.  4. Multivitamin with minerals once a day.  5. Certagen __________ mg 1 tablet daily.  6. Synthroid 125 mcg p.o. once a day.  7. GlycoLax twice a day.  8. Ativan 0.5 mg p.o. b.i.d.  9. Tizanidine 2 mg p.o. at bedtime.  10.Zantac 150 mg at bedtime.  11.Depakote 750 mg at bedtime and 500 mg in the morning.  12.Keppra 2000 mg p.o. b.i.d.  13.Dilantin 100 mg p.o. t.i.d.   Again, this will be correlated with the list the patient has from the  nursing home.   ALLERGIES:  No known drug allergies.   SOCIAL HISTORY:  The patient currently lives at a nursing home.  No  other additional information is available.  Power of attorney is  apparently her sisters.   FAMILY HISTORY:  Not obtainable.   PHYSICAL EXAMINATION:  The patient was conscious but lethargic, not  really communicating.  Not really following any significant commands.  She remains  aphasic.  VITAL SIGNS:  Her blood pressure was 130/45 with a pulse of 72,  respirations 20, temperature was 102.8 degrees Fahrenheit.  Oxygen  saturation was 93% on 2 liters.  HEENT EXAM:  Normocephalic, atraumatic.  Oral mucosa was dry.  No  exudates.  NECK:  Supple.  No JVD or lymphadenopathy.  LUNGS:  Distant air entry with some crackles in the left base.  HEART:  S1, S2.  No S3, S4, gallops or rubs.  ABDOMEN:  Soft, obese, not tender.  Bowel sounds positive.  Extremities:  Trace edema with no calf induration noted.  CNS EXAM:  The patient was conscious but not really communicating and  she is aphasic, which is not new finding, but it appears that the  patient is a lot weaker and that she is now, hence the transfer from the  nursing home.   LABORATORY/DIAGNOSTIC DATA:  Blood gas, pH of 7.421, pCO2 of 44.5, pO2  was 59.8, saturation of 90.1%.  This was on room air.  White blood cell  count 4.3, hemoglobin is 14.1, hematocrit is 41, platelet count was  93,000, with 61% neutrophils.  Sodium is 140,  potassium was 4.0,  chloride of 104, CO2 was 30, glucose was 100, BUN of 11, creatinine was  0.62, AST is 46, ALT of 34, calcium was 9.7.  Blood cultures have been  drawn.  Serum Depakote level was at 92.  Chest x-ray shows left lower  lobe infiltrate.   ASSESSMENT/PLAN:  [name] is a 60 year old female who has been  admitted with what appears to be an aspiration episode.  Had chest x-ray  showed a left lower lobe infiltrate.  The patient does have a fever at  this time.  The patient will be admitted and treated for aspiration  pneumonia/pneumonitis and also potentially healthcare-associated  pneumonia since the patient lives in a nursing home.  1. The patient be admitted to the medical floor.  2. The patient will be initiated on Zosyn and we will also add      vancomycin, which we can de-escalate on depending on how she does      in the next 48 hours.  3. Will try to obtain a swallow evaluation before she swallows again.      She will be nil per os for now until we are sure that the patient      is able to swallow.  The patient may require swallowing evaluation,      although reviewing      her records does not show any pattern of repetitive aspiration, but      I think it is reasonable to try to investigate this further at this      time.   I will be discussing the current plan with family members once they  become available.      Margaretmary Dys, M.D.  Electronically Signed     AM/MEDQ  D:  09/01/2008  T:  09/01/2008  Job:  [id number]

## 2010-08-09 NOTE — Discharge Summary (Signed)
NAMEHAN, LYSNE NO.:  [id number]   MEDICAL RECORD NO.:  [id number]          PATIENT TYPE:  INP   LOCATION:  A215                          FACILITY:  [facility]   PHYSICIAN:  Dorris Singh, DO    DATE OF BIRTH:  1950/08/31   DATE OF ADMISSION:  07/26/2007  DATE OF DISCHARGE:  05/04/2009LH                               DISCHARGE SUMMARY   ADMISSION DIAGNOSIS:  1. Seizure disorder with new onset seizure.  2. History of epilepsy.  3. History of multiple sclerosis.  4. History of hypothyroidism.  5. History of depression and hypertension.   DISCHARGE DIAGNOSES:  1. Breakthrough seizure.  2. Dysphagia.  3. History of epilepsy.  4. History of multiple sclerosis.  5. History of hypothyroidism.  6. History of depression and hypertension.   CONSULTATIONS:  Dr. Gerilyn Pilgrim of neurology and speech pathology.   PRIMARY CARE PHYSICIAN:  She does not have one.   Diagnostic testing that was done includes a CT of the head with contrast  on Jul 26, 2007, which demonstrated severe atrophy and chronic ischemic  white matter disease advanced, age indeterminate right basal ganglion  lacunar infarct and a carotid Doppler which demonstrated no evidence of  significant plaque formation or stenosis or Jul 26, 2007, as well.  She  had an EEG that was ordered that is pending, which can be done  outpatient if not completed here in the hospital.   HOSPITAL COURSE:  Her history and physical was done by Dr. Lilian Kapur,  please refer.  To summarize, the patient is a 60 year old [place]  American female with a history of multiple sclerosis, who is mute,  presents from the [place] nursing home with an apparent breakthrough  seizure.  She apparently had been under well control and had been seeing  Dr. Darleen Crocker A. [place], outpatient.  She was admitted to the service of  [facility] to the telemetry unit for her seizures.   1. Her Keppra was increased to 1500 mg at night with 1000 mg during      the day.  Also a neurologic consult was obtained, where she was      seen by Dr. Gerilyn Pilgrim, who was able to see the patient and adjust      her medication.  She continued to do well without any repeat      seizures during her stay.   1. Dysphagia.  When the patient presented postictally, she was unable      to swallow.  There was some concerns about whether or not she could      swallow thin or thick liquids.  The patient continued to have      problems with thin liquids.  At that point in time, it was      determined due to her multiple sclerosis and her history of      epilepsy, that we may need to get a speech evaluation done.  After      talking with her power of attorney, the patient had a honey      thickened liquid diet with puree.  We decided to go ahead and      repeat the bedside swallow study to see how she tolerated that.      She was able to tolerate without any problems.  We went ahead and      proceeded with a speech evaluation on the day of discharge, and the      patient was able to continue with her current diet.  It is      mentioned though that if she has any other problems that she may      need to follow up with a modified barium swallow study.  Also, she      had an echo done while she was here on the first of May, which      demonstrated overall mild EF diminished to about 45%.  They also      mentioned a CT of her chest or a TEE due to a one-view of a density      in the proximal AC aorta, but it is not appreciated anywhere else.      They think this may be an artifact or an atheroma.  Right now,      there is no clinical correlation, but consider this if she starts      to have any chest pain or any problems with arrhythmia that she      will probably need a TEE or a CT in the future.  The patient      continued to do well.  She started to eat and was seizure-free, so      we will go ahead and discharge her back to [place] nursing home.      While the  patient was here, we talked extensively with the power of      attorney and was able to keep him informed as to her current care      process.   The medications that she is currently on include:  1. [facility] aspirin 81 mg by mouth.  2. Centrum tablet daily.  3. Lovenox 30 mcg intramuscularly every week on Thursdays.  4. Synthroid 75 mcg by mouth every day.  5. Lexapro 10 mg daily.  6. MiraLax powder 17 gm by mouth twice daily mixed in juices.  7. Keppra 1000 mg in the morning and 1500 mg at night.  8. Baclofen 20 mg 1 by mouth four times a day.  9. Zanaflex 2 mg by mouth at bedtime.  10.Zantac syrup 50 mg 2 teaspoons, 10 mL by mouth at bedtime.  11.Tylenol 325 tablets 2 by mouth every 4 hours needed for pain.   DISPOSITION:  Stable.  She will be discharged back to [facility].      Dorris Singh, DO  Electronically Signed     CB/MEDQ  D:  07/29/2007  T:  07/29/2007  Job:  [id number]

## 2010-08-09 NOTE — Discharge Summary (Signed)
[name], XIE NO.:  [id number]   MEDICAL RECORD NO.:  [id number]          PATIENT TYPE:  INP   LOCATION:  A315                          FACILITY:  [facility]   PHYSICIAN:  Skeet Latch, DO    DATE OF BIRTH:  [birth year]   DATE OF ADMISSION:  07/30/2007  DATE OF DISCHARGE:  05/06/2009LH                               DISCHARGE SUMMARY   DISCHARGE DIAGNOSES:  1. Status epilepticus with breakthrough seizures.  2. History of MS.  3. History of hypothyroidism.  4. History of depression hypertension.  5. History of epilepsy.   BRIEF HOSPITAL COURSE:  [name] is a 60 year old African American  female who was recently discharged on Jul 29, 2007, back to [place]  secondary to seizure disorder.  The patient returned from the nursing  home, stated that the patient was still having seizures one hour prior  to leaving the nursing home.  When she returned to the emergency room,  the patient was treated with Ativan for seizures and she was postictal  at that time.  Neurology was again consulted.  Her Keppra was increased,  and now she has been placed on Depakote.  The patient had a slight fever  upon admission and is now within normal range.  The patient was placed  on her home medications when she was admitted to [facility].  Again, Neurology had seen the patient and adjusted her medications and  feels the patient at this time can be discharged back to [facility].   PHYSICAL EXAMINATION:  GENERAL:  Today, patient is awake and alert will  respond to questions.  She is in no acute distress at this time.  The  patient did have a chest x-ray performed on Jul 30, 2007.  Initial plate-  like atelectasis versus scarring at the left base and mild volume at the  right base.  No definite pneumonia or significant pleural fluid was  seen.  The patient did have a carotid Doppler study done on Jul 26, 2007,  that showed no evidence of significant plaque formation of  stenosis, and  also at that time she had a CT of her head done that showed severe  atrophy and chronic ischemic white matter disease, age advanced and age  indeterminate right basal ganglia lacunar infarction.  Again, feel the  patient is stable enough to be discharged back to [facility]  after  recommendations with Neurology.  VITAL SIGNS:  On discharge, temperature is 97.9, pulse 70, respirations  20, blood pressure 144/81.   LABORATORY DATA:  Sodium 142, potassium 3.5, chloride 107, CO2 31,  glucose 116, BUN 5, creatinine 0.66, white count 5100, hemoglobin 11.6,  hematocrit 32.9, platelet count 193,000.  TSH was 3.480.  Her urinalysis  was unremarkable.   MEDICATIONS AT DISCHARGE:  1. Synthroid 75 mcg once daily.  2. Aspirin 81 mg daily.  3. Avonex 30 mcg per week.  4. Lexapro 10 mg daily.  5. MiraLax 17 grams p.o. twice a day.  6. Zanaflex 2 mg at bedtime.  7. Zantac 10 ml at bedtime.  8. Tylenol  325 mg as needed.  9. Baclofen 20 mg four times a day.  10.Certidine one tablet once a day.  11.Keppra 1500 mg was twice daily, brand name only.  12.Depakote 250 mg in the morning and 500 mg in the evening.   CONDITION ON DISCHARGE:  Stable.   DISPOSITION:  Patient will be discharged back to [facility].   DISCHARGE INSTRUCTIONS:  1. The patient is to follow-up with physician at [facility] within the      next few days.  2. The patient is to resume her previous diet.  3. The patient is to take medications as directed.  4. The patient is to resume her regular activities also.  5. The patient is to follow up with Dr. Gerilyn Pilgrim, Neurology, in the      next 4 weeks.      Skeet Latch, DO  Electronically Signed     SM/MEDQ  D:  07/31/2007  T:  07/31/2007  Job:  [id number]   cc:   Markham Jordan L. Effie Shy, M.D.  Fax: [phone]   Kofi A. Gerilyn Pilgrim, M.D.  Fax: [phone]

## 2010-08-09 NOTE — H&P (Signed)
[name], [name] NO.:  [id number]   MEDICAL RECORD NO.:  [id number]          PATIENT TYPE:  INP   LOCATION:  A215                          FACILITY:  [facility]   PHYSICIAN:  Skeet Latch, DO    DATE OF BIRTH:  1950/10/26   DATE OF ADMISSION:  07/26/2007  DATE OF DISCHARGE:  LH                              HISTORY & PHYSICAL   CHIEF COMPLAINT:  Seizures.   HISTORY OF PRESENT ILLNESS:  This is a 60 year old African American  female with history of epilepsy and seizure disorder who presents from  [place] nursing home with apparent seizures.  The patient is  postictal.  No history was given by the patient.  History is by the  records.  Apparently, the patient was having some seizure activity in  the nursing home and she was brought in for evaluation.  She has had  previous episode and was brought to [facility] in 2005.  In the  emergency room she was given IV Ativan as well as IV Keppra and at this  time she is postictal and having no seizures.  She did have a CT of her  head without contrast performed which shows:  (1) Severe atrophy and  chronic ischemic white matter disease.  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   PAST MEDICAL HISTORY:  1. Seizure disorder.  2. Multiple sclerosis.  3. Hypothyroidism.  4. Epilepsy.  5. Depression.  6. Hypertension.   CURRENT MEDICATIONS:  1. Keppra 1000 mg twice daily.  2. Synthroid 1000 mcg once a day.  3. Aspirin 81 mg daily.  4. Multivitamin daily.  5. Avonex IM 30 mcg per week.  6. Lexapro 10 mg daily.  7. MiraLax 17 g daily.  8. Zanaflex 2 mg at bedtime.  9. Zantac 15 mg/mL at bedtime.  10.Tylenol 325 mg as needed.  11.Baclofen 20 mg four times a day.   ALLERGIES:  No known drug allergies.   SURGICAL HISTORY:  Unknown.   SOCIAL HISTORY:  Lives at a nursing home.  Unsure of history of tobacco  abuse.   PHYSICAL EXAMINATION:  VITAL SIGNS:  No temperature at this  time, blood  pressure is 120/83, pulse 129, respirations are 24.  HEENT:  Head is atraumatic, normocephalic.  PERRLA.  EOMI.  NECK:  Not distended, no scleral icterus.  LUNGS:  Minimal wheezing is appreciated, no rhonchi.  HEART:  She is tachycardic.  No rubs, gallops or murmurs.  ABDOMEN:  Soft, nondistended, positive bowel sounds.  No guarding or  rebound tenderness.  EXTREMITIES:  She has trace edema bilaterally.  No clubbing or cyanosis.  NEUROLOGIC:  Apparently she has a left hemiparesis.  Cranial nerves II-  XII grossly intact.  The patient is postictal secondary to Keppra and  Ativan, not responding to any verbal stimulus at this time.   RADIOLOGIC STUDIES:  CT of her head shows:  (1) Severe atrophy and  chronic ischemic white matter disease.  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   ASSESSMENT:  1. Seizure.  2. History of epilepsy.  3. History of multiple sclerosis.  4. History of hypothyroidism.  5. History of depression and hypertension.   PLAN:  1. The patient will be admitted to a telemetry unit for closer      monitoring.  2. For her seizure activity, the patient has been loaded with Keppra      and has been given Ativan.  At this time will try to treat with      Ativan if she has any more seizure activity and will give Keppra if      needed if Ativan does not help.  3. Will get a neurologic consult at this time.  4. The patient will be placed on seizure precautions and vital signs      done every 3 hours.  5. Will get carotid studies as well as a 2-D echocardiogram.  6. The patient will be kept n.p.o. until she is awake and she will      have nothing by mouth until she is awake.  At this time will await      further recommendations with neurology.      Skeet Latch, DO  Electronically Signed     SM/MEDQ  D:  07/26/2007  T:  07/26/2007  Job:  [id number]

## 2010-08-09 NOTE — Discharge Summary (Signed)
[name], [name] NO.:  [id number]   MEDICAL RECORD NO.:  [id number]          PATIENT TYPE:  INP   LOCATION:  A334                          FACILITY:  [facility]   PHYSICIAN:  Osvaldo Shipper, MD     DATE OF BIRTH:  [birth year]   DATE OF ADMISSION:  09/09/2007  DATE OF DISCHARGE:  06/18/2009LH                               DISCHARGE SUMMARY   REFERRING PHYSICIAN:  [facility].   The patient's PMD is Dr. Gae Gallop.  She actually lives in [place] [facility]  [place] which is a nursing home.  She is also followed by Dr. Gerilyn Pilgrim,  neurologist.   DISCHARGE DIAGNOSES:  1. Status epilepticus resolved.  2. Problems with compliance and medication.  3. Multiple sclerosis with chronic aphasia.  4. Right hand pain unclear etiology, resolved.  5. Hypothyroidism, depression, hypertension all stable.  6. Also has dysphagia.   Please review H and P dictated at the time of admission for details  regarding the patient's presenting illness.   BRIEF HOSPITAL COURSE:  Briefly, this is a 60 year old female who lives  at [place] [facility] [place] who was sent to the hospital after she  experienced seizures.  The patient was given some Ativan at the nursing  home and sent here.  Apparently, she has been having some issues with  seizure activity which has been thought to be related to compliance as  well as medications which are not brand names.  The patient was admitted  to the hospital, she was monitored closely, her seizures subsided.  She  was started on Keppra and brand name Depakote.  Her Dilantin was  discontinued.  The patient continued to improve.  Yesterday she was  complaining of some hand pain, etiology of which was not very clear.  We  gave her a dose of Dilaudid.  She had good peripheral pulses, good  capillary refill so we did not pursue anything aggressive.  This morning  her hand pain has completely resolved.  She has not had any seizure  activity in 2 days.  She has been  having some difficulty with swallowing  her Depakote.  She does have some dysphagia requiring dysphagia II diet.  The Depakote cannot be crushed so I discussed with Dr. Gerilyn Pilgrim and he  said that she could be put on Depakote sprinkles which can be sprinkled  on applesauce or ice cream and given twice a day.   Other workup that she had in the hospital included TSH which was 17.2.  She is already on Synthroid and we will increase the dose of that  medication. The rest of her on medical issues are all stable.  This  morning the patient was seen.  She is not able to communicate but she  appears to be much more comfortable.   VITAL SIGNS:  All stable.  She is saturating 90%-92% on room air.  LUNGS:  Clear to auscultation bilaterally.  CARDIOVASCULAR:  Examination was unremarkable.   DISCHARGE MEDICATIONS:  1. Depakote sprinkles 500 mg twice a day.  Please sprinkle on      applesauce  prior to giving it to her.  Please do not change this      medications.  This should be DAW (dispensed as written).  2. Keppra 2000 mg p.o. twice a day dispensed as written.  3. Synthroid 100 mcg once daily.  This is a changed dose as well.  4. Aspirin 81 mg daily.  5. Avonex IM 30 mcg every Tuesday.  6. Lexapro 10 mg daily.  7. MiraLax 17 g twice a day.  8. Zanaflex 2 mg at bedtime.  9. Zantac 20 mg at bedtime.  10.Acetaminophen 650 mg every 4 hours as needed for pain.  11.Baclofen 20 mg four times a day.  12.Certagen 1 tablet daily.   DIET:  She needs to have a dysphagia II diet with full aspiration  precautions.  She needs to be sitting up while being fed.  As discussed  above, please wrinkle the Depakote on applesauce or ice cream twice a  day.   PHYSICAL ACTIVITY:  She is pretty much bedridden.   FOLLOW UP:  With Dr. Gerilyn Pilgrim in 1 month's time.   IMAGING STUDIES DURING THIS HOSPITAL STAY:  None.   CONSULTATIONS:  Dr. Gerilyn Pilgrim.   Total time of discharge 35 minutes.      Osvaldo Shipper,  MD  Electronically Signed     GK/MEDQ  D:  09/12/2007  T:  09/12/2007  Job:  [id number]   cc:   Darleen Crocker A. Gerilyn Pilgrim, M.D.  Fax: [phone]

## 2010-08-09 NOTE — Group Therapy Note (Signed)
NAME:  [name], [name] NO.:  [id number]   MEDICAL RECORD NO.:  [id number]          PATIENT TYPE:  INP   LOCATION:  A334                          FACILITY:  [facility]   PHYSICIAN:  Lucita Ferrara, MD         DATE OF BIRTH:  [birth year]   DATE OF PROCEDURE:  09/10/2007  DATE OF DISCHARGE:                                 PROGRESS NOTE   Full history, physical examination, laboratory and diagnostic values  reviewed.  The patient came in yesterday.  She had a history of MS and  epilepsy.  She has had recurrent seizures over the last several months.  She is currently on Keppra.  At the nursing home facility she was given  Ativan secondary to recurrent seizures.  Yet seizures were intractable.  She  was brought into the emergency room.  She was quite postictal at  that time, and blood levels of valproate and Dilantin level were  undetectable.  She was loaded with Dilantin.  Neurology was consulted.  Currently she has no active complaints.   PHYSICAL EXAMINATION:  VITAL SIGNS:  Temperature 97.9, pulse 86,  respirations 20, blood pressure 126/65.  HEENT:  Normocephalic, atraumatic.  CARDIOVASCULAR:  S1, S2.  ABDOMEN:  Soft, nontender.  LUNGS:  Clear to auscultation.  EXTREMITIES:  No clubbing or cyanosis.  NEUROLOGICAL:  At baseline.   LABORATORY DATA:  Phos normal, mag normal.  CMP normal.  CBC normal.  Valproic acid level less than 10.  Dilantin level less than 2.5.  Basic  metabolic panel normal.   ASSESSMENT/PLAN:  1. Per neurology resolved status epilepticus.  2. Issues with compliance or issues with institution of medications to      get levels in appropriate range.  3. Hypothyroidism.  4. History of multiple sclerosis.  5. Patient is mute.  6. A history of depression.  7. Hypertension.  8. Dysphagia.   PLAN:  She is apparently unable to swallow.  Will proceed with a  swallowing evaluation.  Will change her medications to IV.  The rest of  the plans are dependent  on her progress, consultant recommendations.      Lucita Ferrara, MD  Electronically Signed     RR/MEDQ  D:  09/10/2007  T:  09/10/2007  Job:  [id number]

## 2010-08-09 NOTE — H&P (Signed)
[name], [name] NO.:  [id number]   MEDICAL RECORD NO.:  [id number]          PATIENT TYPE:  INP   LOCATION:  IC03                          FACILITY:  [facility]   PHYSICIAN:  Osvaldo Shipper, MD     DATE OF BIRTH:  Aug 21, 1950   DATE OF ADMISSION:  03/17/2008  DATE OF DISCHARGE:  LH                              HISTORY & PHYSICAL   The patient's PMD is Dr. Virgina Organ.  She resides at [facility].   ADMISSION DIAGNOSIS:  1. Seizure in the setting of a known seizure disorder.  2. History of multiple sclerosis with chronic aphasia.  3. Depression and hypothyroidism, all stable.   CHIEF COMPLAINT:  Seizure.   HISTORY OF PRESENT ILLNESS:  The patient is a 60 year old female who  resides at [facility] who was last admitted here from June 15  through June 18 for status epilepticus.  Her medications were adjusted  at that time and Dilantin was discontinued.  In between now and then,  Dilantin has been reinitiated.  The patient today had two episodes of  seizure at the nursing home.  She was brought into the hospital for  further evaluation.  She was given Ativan and she has not had any  recurrence of her symptoms.  The patient has multiple sclerosis with  chronic aphasia and no history is available from her at this time.  Upon  asking her if she is experiencing any discomfort, the patient is denying  it.   MEDICATIONS AT HOME:  She is on aspirin 81 mg daily, Avonex 30 mcg  intramuscularly every Tuesday, Lexapro 10 mg p.o. daily, multivitamin  with mineral, Certagen 1 tablet daily, Synthroid 125 mcg daily, GlycoLax  twice daily, Ativan 0.5 mg b.i.d., tizanidine 2 mg p.o. at bedtime,  Zantac 150 mg at bedtime, Tylenol as needed.  Vicodin was discontinued  in November.  She was on the following antiepileptic medications:  Dilantin.  This was changed on December 11 from 200 mg b.i.d. to 100 mg  daily.  Depakote was continued at 500 mg p.o. b.i.d.  She is  also on  Keppra 2000 mg p.o. b.i.d.   ALLERGIES:  NO KNOWN DRUG ALLERGIES.   PAST MEDICAL HISTORY:  Positive for hypothyroidism, seizure disorder,  multiple sclerosis, and depression.  There is a history of hypertension  though she is not on any antihypertensive agents.   SOCIAL HISTORY:  She lives in a nursing home.  Apparently her power  attorney is her sisters but this information is obtained from previous  records.   FAMILY HISTORY:  Unable to do from this patient at this time.   REVIEW OF SYSTEMS:  Unable to do from this patient at this time.   PHYSICAL EXAMINATION:  VITAL SIGNS: Temperature was 98.6 rectally, blood  pressure 108/63, heart rate in the 70s and regular, respiratory rate 15,  saturation 97% on room air.  GENERAL:  This is a well-developed, well-nourished female in no  distress.  HEENT: There is no pallor, no icterus.  Oral mucous membranes moist.  No  oral  lesions are noted.  NECK:  Soft and supple.  No thyromegaly is appreciated.  LUNGS: Clear to auscultation bilaterally.  No wheezing, rales or  rhonchi.  CARDIOVASCULAR:  S1, S2 normal, regular.  No murmurs appreciated.  No  S3, S4, no rubs.  No bruits.  ABDOMEN: Soft, nontender, nondistended.  Bowel sounds are present.  No  masses or organomegaly is appreciated.  EXTREMITIES: Show no edema.  NEUROLOGIC: She has aphasia.  Full neurological examination unable to do  because of lack of patient cooperation.   LABORATORY DATA:  The only labs we have is a B-Met which was  unremarkable.  Phenytoin level was 3.  Valproic acid level was 13.  She  has not had any imaging studies during this admission yet.   ASSESSMENT:  This is a 60 year old female who has a known history of  seizure disorder who had two episodes of seizures.  It appears that her  doses of Dilantin were changed quite significantly on December 11.  Dilantin level at that time was 28.5, hence the doses were reduced,  however, I think the drop in  the doses may have been too significant and  maybe overshot the goal and that probably is the reason for her  breakthrough seizures.  1. Seizure in the setting of known seizure disorder.  I will change      the dose of her Dilantin 100 mg p.o. t.i.d. and increase the dose      of Depakote of 500 in the a.m. and 750 mg in the p.m..  It should      be noted that she should use Depakote sprinkles which can be      sprinkled on top of food and it easier for the patient to take      this.  We will continue with Keppra 2 grams b.i.d.  I was told that      she received a Keppra loading dose in the ED.  2. History multiple sclerosis with aphasia, stable.  Continue with      Avonex every Tuesday.  It is unclear if she received this      medication today or not.  We will need to clarify this with the      nursing home.  3,  History of hypothyroidism and depression, all stable.  Continue her  current medication regimen.   As far as we know, she is a full code.   Further management decisions will depend on results of further testing  and patient's response to treatment.   I am anticipating the patient will be able to go back to the nursing  home within the next day or two if she does not have any more seizures.      Osvaldo Shipper, MD  Electronically Signed     GK/MEDQ  D:  03/17/2008  T:  03/17/2008  Job:  [id number]   cc:   Lemar Lofty, MD

## 2010-08-09 NOTE — Discharge Summary (Signed)
NAME:  [name], [name] NO.:  [id number]   MEDICAL RECORD NO.:  [id number]          PATIENT TYPE:  INP   LOCATION:  IC03                          FACILITY:  [facility]   PHYSICIAN:  Margaretmary Dys, M.D.DATE OF BIRTH:  1951/01/22   DATE OF ADMISSION:  03/17/2008  DATE OF DISCHARGE:  12/24/2009LH                               DISCHARGE SUMMARY   DISCHARGE DIAGNOSES:  1. Seizure disorder.  2. History of multiple sclerosis with chronic aphasia.  3. History of depression.  4. History of hypothyroidism, stable on medications.  5. Subtherapeutic medications.   DISCHARGE MEDICATIONS:  1. Aspirin 81 mg p.o. once a day.  2. Avonex 30 mcg intramuscularly every Tuesday.  3. Lexapro 10 mg p.o. once a day.  4. Multivitamin with minerals once a day.  5. Certagen 80 and 1 tablet daily.  6. Synthroid 125 mcg p.o. once a day.  7. GlycoLax twice daily.  8. Ativan 0.5 mg p.o. b.i.d.  9. Tizanidine 2 mg p.o. at bedtime.  10.Zantac 150 mg at bedtime.  11.Depakote 750 mg at bedtime and 500 mg in the morning.  12.Keppra 2000 mg p.o. b.i.d.  13.Dilantin 100 mg p.o. t.i.d.   FOLLOW UP:  The patient is to follow up with primary care physician and  scheduled by the nursing home.   SPECIAL PRECAUTIONS:  The patient is to be placed on precautions for  seizures.  The patient also recommended aspiration precautions.   PHYSICAL ACTIVITY:  The patient is pretty much bed-ridden and we  recommend fall precautions.   CONSULTATIONS:  Obtained none.   DIET:  The patient is on a dysphagia II diet with full aspiration  precautions.  The patient is to be fully sitting up while being fed.  The patient is to have the Depakote sprinkled on applesauce or ice cream  twice a day.   HOSPITAL COURSE:  Ms. Dolder is a 60 year old female who resides at  [facility] who was last admitted here on March 17, 2008  for breakthrough seizure.  Apparently the patient's medications have  been  recently adjusted and her Dilantin was discontinued.   The patient had 2 episodes of seizure on the day of admission.  The  patient subsequently presented to the emergency room for further  evaluation.  The patient received some Ativan was also loaded with  Keppra in the emergency room.  She did not have any more seizures since  being here in the hospital.  She has remained mostly stable.  The  patient is generally aphasic but does follow some commands.  There was  no worsening of baseline mental status.   On March 19, 2008, I saw the patient and she was doing much better.  She remained mostly stable.  Her laboratory data was unremarkable.  The  patient was subsequently discharged back to [facility] in [place] for  further care.   Her phenytoin level on day of discharge was 11.6 and valproic acid had  improved to 45.   LABORATORY DATA ON ADMISSION:  Sodium was 142, potassium 4.0, chloride  of 109, CO2  was 24, glucose 106, BUN of 8, creatinine was 0.63.  Calcium  was 9.35, phenytoin level was 3.0, valproic acid level was 13.      Margaretmary Dys, M.D.  Electronically Signed     AM/MEDQ  D:  03/19/2008  T:  03/19/2008  Job:  [id number]

## 2010-08-09 NOTE — Discharge Summary (Signed)
[name], [name] NO.:  [id number]   MEDICAL RECORD NO.:  [id number]          PATIENT TYPE:  INP   LOCATION:  IC12                          FACILITY:  [facility]   PHYSICIAN:  Osvaldo Shipper, MD     DATE OF BIRTH:  01/04/1951   DATE OF ADMISSION:  08/31/2008  DATE OF DISCHARGE:  06/11/2010LH                               DISCHARGE SUMMARY   DISPOSITION:  Patient resides at [facility] where she is  followed by Dr. Dyann Ruddle.   HISTORY OF PRESENT ILLNESS:  Please review the history and physical  dictated by Dr. Sherle Poe for details regarding the patient's presenting  illness.   DISCHARGE DIAGNOSES:  1. Aspiration pneumonitis, improved.  2. History of multiple sclerosis.  3. Mild dehydration, improved.  4. Thrombocytopenia, likely secondary to heparin and other      medications, requiring followup.  5. History of hypothyroidism.  6. History of seizure disorder.   BRIEF HOSPITAL COURSE:  1. Aspiration pneumonitis.  This is a 60 year old African American      female who has quite advanced multiple sclerosis who lives in a      nursing home.  She apparently choked when she was drinking fluids      and she was brought into the hospital.  She was found to be hypoxic      at the nursing home as well.  X-ray done showed left lower lobe      infiltrates and right lower lobe scarring.  She was febrile.      Patient was initially started on vancomycin and Zosyn and this was      discontinued and she was switched over to Levaquin.  She has      remained afebrile for the past greater than 24 hours.  She is      saturating quite well on room air.  Does not have any cough.  2. Dysphagia.  The patient, because of this aspiration, underwent a      swallowing evaluation along with modified barium swallow and she      was found to be dysphagic and her diet was changed to dysphagia 2      diet with just chopped foods along with nectar-thick liquids.      Strict  aspiration precautions were recommended.  Ice chips are okay      between meals.  3. Thrombocytopenia.  She was noted to have a low platelet count when      she was admitted at 93,000 and this has dropped down to 57,000.      She has not had any bleeding.  She was started on Lovenox when she      came in and this was discontinued 2 days ago.  She is also on      multiple other drugs which can be causing low platelet count      including the Avonex.  She is also on Depakote which can cause      thrombocytopenia.  The patient was also started on Dilantin which      can also  cause thrombocytopenia.  She has not had any bleeding.      She will need a repeat platelet count checked on Monday and if it      is still low she might benefit from a hematology evaluation.  4. Seizure disorder.  The patient has a history of seizure disorder      and she is on Keppra and Depakote.  The last time she was here back      in December of 2009 she was put on Dilantin.  Her medication list      from [facility] did not show Dilantin at this time so I am      presuming this was discontinued some time in between.  I am going      to actually go ahead and discontinue her Dilantin.  I will defer to      her physicians at the nursing home to determine if she needs to be      on this medication or not.  5. Advanced multiple sclerosis, stable.  6. Hypothyroidism, stable.  7. Patient actually fell yesterday but no musculoskeletal issues are      noted at this time.   CLINICAL DATA:  Imaging studies done include a chest x-ray as discussed  above and a modified barium swallow, also discussed above.   DISCHARGE PHYSICAL EXAMINATION:  GENERAL:  On the day of discharge, the  patient is feeling well.  She is getting a bath at this time.  She is  smiling.  She appears to be in no distress.  VITAL SIGNS:  Temperature 97.3, heart rate 76, respiratory rate 18,  blood pressure 107/56.  She is saturating 98% on room air.   LUNGS:  Clear to auscultation bilaterally.  CARDIOVASCULAR:  S1, S2 normal, regular.  No S3, S4, no rubs, no bruits.  No edema.  Peripheral pulses are palpable.  ABDOMEN:  Soft, nontender, nondistended.  MUSCULOSKELETAL:  Exam unremarkable.  Good range of motion in hips,  knees, elbows, shoulders.  No tenderness was noted.  No joint swelling  noted.   LABORATORY DATA:  White count 5.0, hemoglobin 11.1, platelet count  57,000, potassium 3.4.  Blood cultures have been negative.  Valproic  acid level was 92.   DISCHARGE MEDICATIONS:  1. Aspirin 81 mg daily.  2. Avonex 30 mcg IM every Tuesday.  3. Lexapro 10 mg daily.  4. Multivitamin one tablet daily.  5. Synthroid 125 mcg every Monday, Tuesday, Wednesday, Thursday and      Friday.  Synthroid 150 mcg every Saturday and Sunday.  6. Clearlax 17 grams mixed in 8 ounces of liquid daily.  7. Keppra 2000 mg b.i.d.  8. Ativan injection 2 mg IM every 8 hours as needed for seizure.  9. Ativan 0.5 mg tablet p.o. b.i.d.  10.Depakote sprinkles DAW 875 mg b.i.d.  11.Tizanidine 2 mg at bedtime.  12.Zantac 150 mg at bedtime.  13.TwoCal HN 60 mL with medications t.i.d.  14.Tylenol 650 mg every 4 hours as needed for pain.  15.Levaquin 500 mg p.o. daily for 5 more days.   FOLLOW UP LABS AND INSTRUCTIONS:  Labs to be done include a CBC on  Monday, September 07, 2008 to follow up on platelet counts.  If platelet  counts are low, will recommend holding the Avonex until the patient is  evaluated by a hematologist.  If she starts developing spontaneous  bleeding, please send her back to the hospital.   DIET:  She needs to be on  a dysphagia 2 diet with nectar-thick liquids.  Full aspiration precautions to be employed.  She may have ice chips in  between meals.   PHYSICAL ACTIVITY:  She is pretty much bedridden; otherwise, she may do  whatever she was doing at the nursing home before.   CONSULTATIONS:  No consultation were obtained during this  admission.   Total time on this encounter is 35 minutes.      Osvaldo Shipper, MD  Electronically Signed     GK/MEDQ  D:  09/04/2008  T:  09/04/2008  Job:  [id number]   cc:   Prescott Parma  Fax: [phone]   Kofi A. Gerilyn Pilgrim, M.D.  Fax: [phone]

## 2010-08-12 NOTE — Consult Note (Signed)
NAME:  [name], [name] NO.:  [id number]   MEDICAL RECORD NO.:  [id number]          PATIENT TYPE:  EMS   LOCATION:  ED                            FACILITY:  [facility]   PHYSICIAN:  Hanley Hays. Dechurch, M.D.DATE OF BIRTH:  02-25-1951   DATE OF CONSULTATION:  04/02/2004  DATE OF DISCHARGE:  04/02/2004                                   CONSULTATION   HISTORY OF PRESENT ILLNESS:  A 60 year old African-American female with  multiple sclerosis, who is a resident of [facility], was noted this  morning to complain of severe left lower extremity pain.  She has some  expressive aphasia and difficulty communicating but usually can make her  needs known.  She would not allow the nursing staff to assist her in  movement and apparently became quite agitated.  She was brought to the  emergency room for further evaluation, where I did see her.  Her exam was  essentially unremarkable.  Nursing was concerned about two small red areas,  which looked like areas of pressure, but no evidence of rash, lesion,  cellulitis or other worrisome finding.  Her exam was essentially  unremarkable.  She did complain of some pain in her left hip.  An x-ray was  performed, which revealed no acute findings.  She had received some Lortab  prior to leaving the nursing facility, and her pain was essentially  resolved.  The question was whether she was having some spasm from what she  describes, but it certainly did not need further intervention at this point.  She was discharged back to the nursing facility for observation without  changes in her regimen.   PHYSICAL EXAMINATION:  GENERAL:  A well-developed, well-nourished female  with a baseline mental status, who is able to move her extremities against  gravity.  She does have some left-sided neglect, which is chronic.  She does  tolerate passive range of motion.  There was no significant finding on  either extremity, no edema, no skin breakdown or  lesions.  CHEST:  Lungs were clear to auscultation.  CARDIAC:  Heart was regular.  ABDOMEN:  Soft and nontender.   ASSESSMENT AND PLAN:  1.  Left lower extremity pain, which may have been musculoskeletal-related,      i.e., spasm, but currently resolved.  Urinalysis negative.  BMP with      magnesium normal.  Some mild degenerative changes noted on x-ray.   Current medications include:  1.  Synthroid 100 mcg daily.  2.  Baclofen 20 mg q.i.d.  3.  Dilantin 100 mg t.i.d.  4.  Lexapro 10 mg daily.  5.  Zantac 150 mg at h.s.  6.  Tylenol 650 mg q.4h. p.r.n. pain.   She will be followed up by myself at the nursing facility.  The plan was  discussed with her nurse responsible for her care.      FED/MEDQ  D:  04/15/2004  T:  04/15/2004  Job:  [id number]

## 2010-08-12 NOTE — Discharge Summary (Signed)
NAME:  [name], [name]                           ACCOUNT NO.:  [id number]   MEDICAL RECORD NO.:  [id number]                   PATIENT TYPE:  INP   LOCATION:  A211                                 FACILITY:  [facility]   PHYSICIAN:  Hanley Hays. Dechurch, M.D.           DATE OF BIRTH:  23-Feb-1951   DATE OF ADMISSION:  10/09/2003  DATE OF DISCHARGE:  10/14/2003                                 DISCHARGE SUMMARY   DISCHARGE DIAGNOSES:  1. Left hemiparesis, probable exacerbation of multiple sclerosis.  2. End-stage multiple sclerosis with neurogenic bowel, expressive aphasia,     dysarthria, bed-bound state.  3. History of hypothyroidism.  4. History of depression with anxiety, stable.  5. Seizure disorder.  6. History of hypertension.  7. History of status epilepticus.   DISPOSITION:  The patient is discharged to [facility]  Followup  with Dr. Gerilyn Pilgrim two to four weeks.   DISCHARGE MEDICATIONS:  1. Lexapro 10 mg daily.  2. Zantac 150 mg at night.  3. Climara patch 0.05 mg every seven days.  4. Aspirin 81 mg daily.  5. Dilantin Infatabs 50 mg two tablets t.i.d.  No substitutions.  6. Keppra 500 mg q.a.m., 1000 mg at night, increase to 1000 mg b.i.d. after     one week.  7. Baclofen 20 mg q.i.d.  8. Synthroid 125 mcg daily.  9. Multivitamins with minerals one daily.  10.      Tylenol 650 mg q.4h. p.r.n. pain.   HOSPITAL COURSE:  The patient is a 60 year old African American female with  end-stage MS.  She presented to the emergency room apparently after a  seizure which persisted despite IM Ativan.  She was brought to the  emergency room where she was loaded with IV Dilantin.  Her seizure activity  dissipated.  She was noted to have a dense left hemiparesis.  MRI did not  reveal any acute lesions although she did have multiple areas consistent  with severe demyelinating disease, particularly in the right hemisphere.  The patient was seen in consultation by neurology.  It was felt  that this  was likely representative of an exacerbation of her MS.  Given the fact that  she had a history of status epilepticus and ongoing seizure, it was felt  that she would benefit from a second anti-epileptic agent and Keppra was  added which she tolerated well.  Her medications in the hospital were  adjusted.  Her TSH was noted to be elevated at 7.2, I believe.  Her  Synthroid was increased.  She was given three days of high-dose Solu-Medrol  1 g daily.  She did have some improvement in her left upper extremity  weakness in that she could now grasp with her hand although she could not  raise it from the bed.  She had left-sided neglect.  Her lower extremities  were both pruritic and this did not significantly alter.  The  patient was  able to take p.o. without difficulty.  Her speech was noted to be easy to  understand as far as simple monosyllabic responses.  She could repeat words.  She had difficulty polysyllabic words.  If she became frustrated, she even  had difficulty with monosyllabic words.  It was felt that she had reached  the point of maximum benefit for hospital stay and she is being discharged  back to the skilled nursing facility for ongoing care.  Followup as noted  above.   She will need a followup BMP and TSH in five weeks.  In six weeks she will  need a followup Dilantin level, trough level Monday, July 25.  The plan was  discussed with the patient and her sister, [name].  There seemed to be good  understanding.  She will follow up as noted above with Dr. Gerilyn Pilgrim and  myself at the nursing facility.     ___________________________________________                                         Hanley Hays. Josefine Class, M.D.   FED/MEDQ  D:  10/14/2003  T:  10/14/2003  Job:  [id number]   cc:   Darleen Crocker A. Gerilyn Pilgrim, M.D.  [address] [place]  [place]  [place] [address]  Fax: [phone]

## 2010-08-12 NOTE — Procedures (Signed)
NAME:  ELODY, KLEINSASSER                           ACCOUNT NO.:  [id number]   MEDICAL RECORD NO.:  [id number]                   PATIENT TYPE:  OUT   LOCATION:  RAD                                  FACILITY:  [facility]   PHYSICIAN:  Kofi A. Gerilyn Pilgrim, M.D.              DATE OF BIRTH:  11-22-1950   DATE OF PROCEDURE:  DATE OF DISCHARGE:                                EEG INTERPRETATION   The patient is a 60 year old who has a history of seizures.   ANALYSIS:  A 6-inch recording is conducted for 36 minutes.  There is a  posterior rhythm of 7.5 to 8 hertz bilaterally.  There is beta activity seen  in the frontal areas.  Awake and drowsy activities are noted.  Photic  stimulation does not elicit any abnormal responses.  There is no focal  slowing, lateralized slowing, or epileptiform activity.   IMPRESSION:  Mild generalized slowing; however, there is no epileptiform  activity noted.      ___________________________________________                                            Perlie Gold Gerilyn Pilgrim, M.D.   KAD/MEDQ  D:  10/14/2003  T:  10/14/2003  Job:  [id number]

## 2010-08-12 NOTE — Procedures (Signed)
NAME:  [name], [name]                           ACCOUNT NO.:  [id number]   MEDICAL RECORD NO.:  [id number]                   PATIENT TYPE:  INP   LOCATION:  A211                                 FACILITY:  [facility]   PHYSICIAN:  Vida Roller, M.D.                DATE OF BIRTH:  30-Sep-1950   DATE OF PROCEDURE:  DATE OF DISCHARGE:                                  ECHOCARDIOGRAM   PRIMARY:  Dr. Hanley Hays. Dechurch.   TAPE NUMBER:  [id number]   TAPE COUNT:  [id number] through 2300.   INDICATION:  This is a 60 year old woman who had a CVA, looking for a source  of emboli, no previous echos.   FINDINGS:  Type and quality of this study is poor.   M-MODE TRACING:  The aorta is 26 mm.   Left atrium is 33 mm.   The septum is 10 mm.   Posterior wall is 9 mm.   Left ventricular end-diastolic dimension is 39 mm.   Left ventricular systolic dimension is 29 mm.   TWO-DIMENSIONAL AND DOPPLER IMAGING:  The left ventricle appears to be  normal size with normal left ventricular systolic function.  Wall motion was  beyond the limits of the study and diastolic function was not assessed.   The right ventricle appears to be of normal size with normal systolic  function.   Both atria appear to be of normal size.   The aortic valve does not have any stenosis or regurgitation.   The mitral valve has trivial insufficiency and no stenosis is seen.   The tricuspid valve has trivial insufficiency and no stenosis is seen.   The pulmonic valve was not well-seen.   The inferior vena cava appears to be of normal size.   There is no obvious pericardial effusion.   The ascending aorta was not well-seen.   ASSESSMENT:  This is a study which is not adequate to assess for embolic  phenomenon.  If this is clinically indicated, a transesophageal  echocardiogram is a much better study for this assessment.      ___________________________________________                                            Vida Roller, M.D.   JH/MEDQ  D:  10/12/2003  T:  10/13/2003  Job:  [id number]

## 2010-08-12 NOTE — Procedures (Signed)
NAME:  [name], [name] NO.:  [id number]   MEDICAL RECORD NO.:  [id number]          PATIENT TYPE:  OUT   LOCATION:  RAD                           FACILITY:  [facility]   PHYSICIAN:  Vida Roller, M.D.   DATE OF BIRTH:  Jan 18, 1951   DATE OF PROCEDURE:  11/04/2004  DATE OF DISCHARGE:                                  ECHOCARDIOGRAM   TAPE NUMBER:  [id number]   TAPE COUNT:  [id number]   HISTORY OF PRESENT ILLNESS:  This is a 60 year old woman with multiple  sclerosis and heart failure.  This is an evaluation for LV systolic  function.  The technical quality of this study is extremely limited.  A  contrast agent was used to assess LV systolic function.   M-MODE TRACINGS:  M-Mode tracings are inaccurate.   A 2D AND DOPPLER IMAGING:  The left ventricle appears to be normal size.  There is a depressed LV systolic function with an estimated ejection  fraction of 35-40%.  There is global hypokinesis seen.  There are no obvious  valvular heart lesions.  There did not appear to be a significant  pericardial effusion.      Vida Roller, M.D.  Electronically Signed     JH/MEDQ  D:  11/04/2004  T:  11/04/2004  Job:  [id number]

## 2010-08-12 NOTE — Consult Note (Signed)
NAME:  [name], [name]                           ACCOUNT NO.:  [id number]   MEDICAL RECORD NO.:  [id number]                   PATIENT TYPE:  INP   LOCATION:  A211                                 FACILITY:  [facility]   PHYSICIAN:  Kofi A. Gerilyn Pilgrim, M.D.              DATE OF BIRTH:  04-Apr-1950   DATE OF CONSULTATION:  10/09/2003  DATE OF DISCHARGE:                                   CONSULTATION   REFERRING PHYSICIAN:  Hanley Hays. Dechurch, M.D.   CONSULTING PHYSICIAN:  Kofi A. Gerilyn Pilgrim, M.D.   REASON FOR CONSULTATION:  Possible stroke.   IMPRESSION:  1. Acute encephalopathy due to multiple seizures.  2. End stage multiple sclerosis with dementia and severe neurological     deficits.  3. Possible stroke.  4. Hypertension.   RECOMMENDATIONS:  1. Given that this lady has had a history of status epilepticus I would     strongly recommend that she be placed on a second anti-epileptic     medication.  I am going to suggest Keppra and titrate to about 1,000 mg     b.i.d.  The patient unfortunately is on Dilantin monotherapy which came     be difficult to regulate given the variability in absorption of blood     levels between the generic made by [facility] and the brand name.  As a     general rule, the [place] product results in a 30% less blood level than     the brand name.  Unfortunately, I think this institution only uses the     generic.  This slowly results in confusion going back from one     formulation to the other.  Another complication is that the     pharmacokinetics of Dilantin's complicated being a first older kinetic at     low levels and levels above 10.0 at 0 order kinetics.  I would suggest     that the Dilantin Infatabs 50 mg be used instead of the current dose. I     think this provides a more consistent blood level and can be titrated     with smaller increments as needed.  Also, I will order and     electroencephalography and follow the Dilantin level.   HISTORY OF PRESENT  ILLNESS:  This is a 60 year old lady who apparently has a  history of status epilepticus.  She unfortunately has seemed to have had  severe neurological deficits from end stage multiple sclerosis.  She  currently resides at [facility] facility.  She has had rapid  fluctuations in her Dilantin level.  On April 3 of this year a level  apparently was 28.0 when the patient presented to the emergency room with  altered mental status.  Alternatively, she has had status epilepticus in the  past.  She apparently has had low levels anywhere from 4.0 to 5.0 in the  past.  It appears that her dose was decreased from 300 to 200 mg which may  have resulted in the level going from 4.0 to 28.0.  Again, it is unclear  which formulation of Dilantin was used and only added more confusion to the  story.  The patient apparently presented to the hospital with alteration of  consciousness with a suspicion of stroke.  She had a CT scan which does not  show any acute process.  There appears to have been seizure like activity  with a subtherapeutic Dilantin of 6.3.  She was subsequently given 500 mg of  Dilantin in the emergency room and admitted to the hospital for further  evaluation.  It appears that she has been taking 100 mg of Dilantin t.i.d.  and the exact formulation this is is again unknown.  Her cognition seems to  have improved since she has been up on the floor and may in fact have  returned to baseline.   PAST MEDICAL HISTORY:  1. Multiple sclerosis.  2. Status epilepticus.  3. Epilepsy.  4. Hypertension.  5. Hypothyroidism.  6. Dysphagia.   ADMISSION MEDICATIONS:  1. Dilantin 100 mg t.i.d.;  unclear as if generic or brand.  2. Levothyroxine 0.1 mg daily.  3. Estradiol.  4. Lopressor.  5. Baclofen 20 mg q.i.d.  6. Zantac.  7. Lexapro.  8. Acetaminophen p.r.n.   SOCIAL HISTORY:  The patient again resides at the nursing home. There are no  reports of alcohol, tobacco or  illicit drug use.   REVIEW OF SYMPTOMS:  The ten system review is as in the history of present  illness, otherwise unremarkable and also with impaired cognition, I am  unable to obtain a clear history.   ALLERGIES:  None known.   PHYSICAL EXAMINATION:  GENERAL:  This is a moderately overweight pleasant  lady. She is in no acute distress at this time.  VITAL SIGNS:  Temperature 98.3, pulse 84, respirations 16, blood pressure  111/72.  LUNGS:  Clear to auscultation bilaterally.  CARDIOVASCULAR:  Normal S1 and S2.  ABDOMEN:  Obese but soft.  EXTREMITIES:  No significant edema or varicosities.  NEUROLOGIC:  Mentation reveals that the patient is awake and alert.  She  follows simple commands. She has a severe speaking impairment and in fact  only groans and moans and gestures.  She appeared to be essentially mute,  but does comprehend simple commands.  Cranial nerve evaluation shows her  pupils of 5 mm and briskly reactive.  Extraocular movements are intact.  Facial muscle strength reveals left sided dense paresis.  The tongue  deviates towards the left.  Motor examination shows bilateral weakness with  increased tone.  She is more spastic on the left side with essentially 0 to  5 strength.  Strength in the right upper extremity is about 4/5 and 2/5 in  the right leg.  Reflexes are brisk bilaterally with both toes upgoing.  She  responds to sensation on both sides, somewhat less on the left side,  however.   Review of the CT scan of the head shows extensive and confluent white matter  lucency which is nonspecific and can be seen with MS or ischemic changes.  There is marked atrophy for age with what appears to be more specific  atrophy located in the frontal lobes, particularly on the left side.  There  may be a discrete encephalomalacia.  No acute process is noted.  There is  also atrophy noted in  the cerebellum.  Th MRI findings are essentially the same as the head CT findings with no  evidence of an acute process noted on  diffusion imaging.   LABORATORY DATA:  WBC 10.0, hemoglobin 13.0, platelet count 353,000 with  differential showing a seg of 92%.  Sodium 137, potassium 3.3, chloride 105,  C02 21, BUN 8, creatinine 0.9, glucose 148.  Admission Dilantin level was  6.7.  Albumin 3.9 and calcium 8.6.  Urinalysis is negative.      ___________________________________________                                            Perlie Gold. Gerilyn Pilgrim, M.D.   KAD/MEDQ  D:  10/09/2003  T:  10/09/2003  Job:  [id number]   cc:   Hanley Hays. Dechurch, M.D.  [address]  [place]  [place] [address]  Fax: [phone]

## 2010-08-12 NOTE — H&P (Signed)
NAME:  [name], [name] NO.:  [id number]   MEDICAL RECORD NO.:  [id number]                   PATIENT TYPE:  INP   LOCATION:  A211                                 FACILITY:  [facility]   PHYSICIAN:  Melvyn Novas, MD        DATE OF BIRTH:  1951-02-14   DATE OF ADMISSION:  10/09/2003  DATE OF DISCHARGE:                                HISTORY & PHYSICAL   REASON FOR ADMISSION:  The patient is a 60 year old female nursing home  patient with multiple sclerosis, apparently had a seizure at 5 a.m. this  morning.  The seizure persisted for 20-30 minutes despite administration of  IM Ativan.  She has been sent to the emergency room.  While in the emergency  room the seizure subsided with IV Dilantin.  She was noticed to have a left-  sided hemiparesis which calls to the nursing home revealed was not present  previously.  MRI was done revealing motion artifact, poor quality study, but  no mass effect and no obvious evidence of CVA at present although poor  quality.  She is admitted for status epilepticus, known seizure disorder,  multiple sclerosis, and possible new CVA.  She will be anticoagulated and  antiplatelet therapy with aspirin and Lovenox.   PAST MEDICAL HISTORY:  Significant for seizure disorder, multiple sclerosis,  dysphagia, and hypothyroidism.   CURRENT MEDICINES:  1. Synthroid 100 mcg per day.  2. Baclofen 20 mg q.i.d.  3. Dilantin 100 mg t.i.d.  4. Lexapro 10 daily.  5. Zantac 150 mg h.s.   ALLERGIES:  She has no known allergies.   PHYSICAL EXAMINATION:  VITAL SIGNS:  Blood pressure is 102/72, pulse is 111  and regular, temperature is 100.2, respiratory rate is 18, pulse oximetry is  99%.  HEENT:  Head normocephalic, atraumatic.  Eyes:  PERRLA.  Extraocular  movements intact.  Sclerae clear, conjunctivae pink.  NECK:  Shows no jugular venous distention, no carotid bruits, no  thyromegaly, no thyroid bruits.  LUNGS:  Show no rales,  wheeze, or rhonchi appreciable.  HEART:  Regular rhythm, S1, S2 of normal intensity.  No S3, S4 gallops.  No  heaves, thrills, or rubs appreciable.  ABDOMEN:  Soft, nontender.  Bowel sounds normal active.  No guarding or  rebound.  No mass or hepatosplenomegaly.  EXTREMITIES:  Trace pedal edema.  No clubbing, no cyanosis.  NEUROLOGIC:  There is a left hemiparesis, has minimal withdrawal to pain.  Plantars are downgoing bilaterally.  Cranial nerves II-XII grossly intact.  The patient responds to verbal and tactile stimuli.   IMPRESSION:  1. Status epilepticus, resolved; known seizure disorder.  2. Rule out cerebrovascular accident.  3. Multiple sclerosis.  4. Hypothyroidism.   PLAN:  Admit.  Give IV Cerebryx 300 mg daily.  Get Dilantin level repeat in  the morning.  Get MRI, carotid ultrasound, 2-D echocardiogram to rule out  thrombus etiologies.  Start empirically on  Lovenox 1 mg/kg subcu q.12h.,  aspirin, and I will make further recommendations as the database expands.     ___________________________________________                                         Melvyn Novas, MD   RMD/MEDQ  D:  10/09/2003  T:  10/09/2003  Job:  [id number]

## 2010-12-21 LAB — CBC
HCT: 32.9 — ABNORMAL LOW
HCT: 33.7 — ABNORMAL LOW
HCT: 37.3
Hemoglobin: 11.6 — ABNORMAL LOW
MCHC: 34.4
MCHC: 34.8
MCHC: 35.2
MCV: 85.9
MCV: 86.3
MCV: 86.7
MCV: 86.8
Platelets: 190
Platelets: 197
Platelets: 208
RBC: 3.83 — ABNORMAL LOW
RBC: 3.88
RDW: 13.4
RDW: 13.7
WBC: 5.1
WBC: 7.8

## 2010-12-21 LAB — URINALYSIS, ROUTINE W REFLEX MICROSCOPIC
Bilirubin Urine: NEGATIVE
Glucose, UA: NEGATIVE
Glucose, UA: NEGATIVE
Glucose, UA: NEGATIVE
Hgb urine dipstick: NEGATIVE
Hgb urine dipstick: NEGATIVE
Hgb urine dipstick: NEGATIVE
Nitrite: NEGATIVE
Protein, ur: NEGATIVE
Specific Gravity, Urine: 1.025
pH: 6
pH: 6.5

## 2010-12-21 LAB — DIFFERENTIAL
Basophils Absolute: 0
Basophils Absolute: 0
Basophils Relative: 0
Blasts: 0
Eosinophils Absolute: 0
Eosinophils Absolute: 0.1
Eosinophils Absolute: 0.1
Eosinophils Absolute: 0.3
Eosinophils Relative: 1
Eosinophils Relative: 2
Eosinophils Relative: 5
Lymphocytes Relative: 10 — ABNORMAL LOW
Lymphocytes Relative: 21
Lymphs Abs: 0.7
Lymphs Abs: 1.7
Lymphs Abs: 1.8
Lymphs Abs: 2.1
Monocytes Absolute: 0.3
Monocytes Absolute: 0.5
Monocytes Relative: 10
Monocytes Relative: 5
Monocytes Relative: 7
Myelocytes: 0
Neutro Abs: 5.5
Neutro Abs: 7.2
Neutrophils Relative %: 52
Neutrophils Relative %: 72
Neutrophils Relative %: 84 — ABNORMAL HIGH
Promyelocytes Absolute: 0
nRBC: 0

## 2010-12-21 LAB — COMPREHENSIVE METABOLIC PANEL
ALT: 21
Albumin: 3.7
Alkaline Phosphatase: 94
Calcium: 9.5
Glucose, Bld: 100 — ABNORMAL HIGH
Potassium: 3.7
Sodium: 138
Total Protein: 7.8

## 2010-12-21 LAB — URINE CULTURE: Colony Count: NO GROWTH

## 2010-12-21 LAB — BASIC METABOLIC PANEL
BUN: 10
BUN: 12
CO2: 23
CO2: 31
Calcium: 9.5
Chloride: 102
Chloride: 103
Chloride: 107
Creatinine, Ser: 0.82
Creatinine, Ser: 1.02
GFR calc Af Amer: >60
GFR calc Af Amer: >60
GFR calc Af Amer: >60
GFR calc non Af Amer: >60
Glucose, Bld: 148 — ABNORMAL HIGH
Potassium: 3.5
Potassium: 3.8
Potassium: 4.2
Sodium: 142

## 2010-12-21 LAB — HOMOCYSTEINE: Homocysteine: 4.8

## 2010-12-21 LAB — TSH: TSH: 3.48

## 2010-12-21 LAB — PROTIME-INR: Prothrombin Time: 13

## 2010-12-21 LAB — MAGNESIUM: Magnesium: 1.9

## 2010-12-22 LAB — BASIC METABOLIC PANEL
BUN: 10
CO2: 23
Calcium: 9.2
Glucose, Bld: 140 — ABNORMAL HIGH
Sodium: 141

## 2010-12-22 LAB — DIFFERENTIAL
Basophils Absolute: 0
Basophils Absolute: 0
Basophils Relative: 0
Basophils Relative: 1
Eosinophils Absolute: 0.2
Eosinophils Relative: 0
Eosinophils Relative: 1
Lymphocytes Relative: 18
Lymphs Abs: 1.7
Lymphs Abs: 1.8
Monocytes Absolute: 0.3
Monocytes Absolute: 0.9
Neutro Abs: 9.4 — ABNORMAL HIGH
Neutrophils Relative %: 52

## 2010-12-22 LAB — CBC
Hemoglobin: 11.8 — ABNORMAL LOW
Hemoglobin: 12
MCHC: 34.3
MCHC: 34.4
MCHC: 34.7
MCV: 87.2
MCV: 87.9
Platelets: 195
Platelets: 221
RBC: 3.9
RDW: 15
WBC: 9.4

## 2010-12-22 LAB — COMPREHENSIVE METABOLIC PANEL
ALT: 40 — ABNORMAL HIGH
AST: 40 — ABNORMAL HIGH
Albumin: 3.6
CO2: 29
Calcium: 8.9
Calcium: 9
Creatinine, Ser: 0.77
GFR calc Af Amer: >60
GFR calc non Af Amer: >60
Glucose, Bld: 114 — ABNORMAL HIGH
Sodium: 140

## 2010-12-22 LAB — MAGNESIUM: Magnesium: 2.1

## 2010-12-22 LAB — TSH: TSH: 17.221 — ABNORMAL HIGH

## 2010-12-29 LAB — CBC
Hemoglobin: 11.1 g/dL — ABNORMAL LOW (ref 12.0–15.0)
MCHC: 33.4 g/dL (ref 30.0–36.0)
MCV: 92.8 fL (ref 78.0–100.0)
RBC: 3.6 MIL/uL — ABNORMAL LOW (ref 3.87–5.11)
RBC: 3.81 MIL/uL — ABNORMAL LOW (ref 3.87–5.11)
WBC: 4.7 10*3/uL (ref 4.0–10.5)
WBC: 7 10*3/uL (ref 4.0–10.5)

## 2010-12-29 LAB — COMPREHENSIVE METABOLIC PANEL
ALT: 23 U/L (ref 0–35)
CO2: 29 mEq/L (ref 19–32)
Calcium: 9.1 mg/dL (ref 8.4–10.5)
Chloride: 105 mEq/L (ref 96–112)
Creatinine, Ser: 0.56 mg/dL (ref 0.4–1.2)
GFR calc non Af Amer: >60 mL/min (ref >60)
Glucose, Bld: 88 mg/dL (ref 70–99)
Total Bilirubin: 0.5 mg/dL (ref 0.3–1.2)

## 2010-12-29 LAB — DIFFERENTIAL
Basophils Absolute: 0 10*3/uL (ref 0.0–0.1)
Eosinophils Absolute: 0.2 10*3/uL (ref 0.0–0.7)
Eosinophils Relative: 4 % (ref 0–5)
Lymphocytes Relative: 25 % (ref 12–46)
Lymphocytes Relative: 40 % (ref 12–46)
Lymphs Abs: 1.8 10*3/uL (ref 0.7–4.0)
Lymphs Abs: 1.9 10*3/uL (ref 0.7–4.0)
Monocytes Relative: 8 % (ref 3–12)
Neutro Abs: 4.6 10*3/uL (ref 1.7–7.7)
Neutrophils Relative %: 47 % (ref 43–77)
Neutrophils Relative %: 65 % (ref 43–77)

## 2010-12-29 LAB — VALPROIC ACID LEVEL
Valproic Acid Lvl: 13 ug/mL — ABNORMAL LOW (ref 50.0–100.0)
Valproic Acid Lvl: 45 ug/mL — ABNORMAL LOW (ref 50.0–100.0)

## 2010-12-29 LAB — BASIC METABOLIC PANEL
Calcium: 9.3 mg/dL (ref 8.4–10.5)
Calcium: 9.3 mg/dL (ref 8.4–10.5)
Chloride: 107 mEq/L (ref 96–112)
Chloride: 109 mEq/L (ref 96–112)
Creatinine, Ser: 0.62 mg/dL (ref 0.4–1.2)
Creatinine, Ser: 0.63 mg/dL (ref 0.4–1.2)
GFR calc Af Amer: >60 mL/min (ref >60)
GFR calc Af Amer: >60 mL/min (ref >60)
GFR calc non Af Amer: >60 mL/min (ref >60)
GFR calc non Af Amer: >60 mL/min (ref >60)

## 2010-12-29 LAB — PHENYTOIN LEVEL, TOTAL
Phenytoin Lvl: 11.6 ug/mL (ref 10.0–20.0)
Phenytoin Lvl: 3 ug/mL — ABNORMAL LOW (ref 10.0–20.0)

## 2011-02-20 ENCOUNTER — Ambulatory Visit (HOSPITAL_COMMUNITY): Payer: Medicare Other

## 2011-02-21 ENCOUNTER — Ambulatory Visit (HOSPITAL_COMMUNITY): Payer: Medicare Other

## 2011-02-22 ENCOUNTER — Ambulatory Visit (HOSPITAL_COMMUNITY): Payer: Medicare Other

## 2011-04-13 ENCOUNTER — Encounter (HOSPITAL_COMMUNITY): Payer: Self-pay

## 2011-04-13 ENCOUNTER — Emergency Department (HOSPITAL_COMMUNITY)
Admission: EM | Admit: 2011-04-13 | Discharge: 2011-04-13 | Disposition: A | Discharge status: Skilled Nursing Facility | Payer: Medicare Other | Attending: Emergency Medicine | Admitting: Emergency Medicine

## 2011-04-13 DIAGNOSIS — E039 Hypothyroidism, unspecified: Secondary | ICD-10-CM | POA: Insufficient documentation

## 2011-04-13 DIAGNOSIS — G8190 Hemiplegia, unspecified affecting unspecified side: Secondary | ICD-10-CM | POA: Insufficient documentation

## 2011-04-13 DIAGNOSIS — K219 Gastro-esophageal reflux disease without esophagitis: Secondary | ICD-10-CM | POA: Insufficient documentation

## 2011-04-13 DIAGNOSIS — Z8673 Personal history of transient ischemic attack (TIA), and cerebral infarction without residual deficits: Secondary | ICD-10-CM | POA: Insufficient documentation

## 2011-04-13 DIAGNOSIS — Z7982 Long term (current) use of aspirin: Secondary | ICD-10-CM | POA: Insufficient documentation

## 2011-04-13 DIAGNOSIS — I10 Essential (primary) hypertension: Secondary | ICD-10-CM | POA: Insufficient documentation

## 2011-04-13 DIAGNOSIS — G40909 Epilepsy, unspecified, not intractable, without status epilepticus: Secondary | ICD-10-CM | POA: Insufficient documentation

## 2011-04-13 DIAGNOSIS — G35 Multiple sclerosis: Secondary | ICD-10-CM | POA: Insufficient documentation

## 2011-04-13 DIAGNOSIS — Z8701 Personal history of pneumonia (recurrent): Secondary | ICD-10-CM | POA: Insufficient documentation

## 2011-04-13 HISTORY — DX: Aphasia: R47.01

## 2011-04-13 HISTORY — DX: Essential (primary) hypertension: I10

## 2011-04-13 HISTORY — DX: Unspecified convulsions: R56.9

## 2011-04-13 HISTORY — DX: Major depressive disorder, single episode, unspecified: F32.9

## 2011-04-13 HISTORY — DX: Dehydration: E86.0

## 2011-04-13 HISTORY — DX: Gastro-esophageal reflux disease without esophagitis: K21.9

## 2011-04-13 HISTORY — DX: Multiple sclerosis: G35

## 2011-04-13 HISTORY — DX: Pneumonia, unspecified organism: J18.9

## 2011-04-13 HISTORY — DX: Depression, unspecified: F32.A

## 2011-04-13 HISTORY — DX: Thrombocytopenia, unspecified: D69.6

## 2011-04-13 HISTORY — DX: Hypothyroidism, unspecified: E03.9

## 2011-04-13 HISTORY — DX: Hemiplegia, unspecified affecting left nondominant side: G81.94

## 2011-04-13 HISTORY — DX: Cerebral infarction, unspecified: I63.9

## 2011-04-13 LAB — CBC
Hemoglobin: 12.3 g/dL (ref 12.0–15.0)
MCH: 30 pg (ref 26.0–34.0)
MCHC: 32.6 g/dL (ref 30.0–36.0)
Platelets: 103 10*3/uL — ABNORMAL LOW (ref 150–400)

## 2011-04-13 LAB — URINALYSIS, ROUTINE W REFLEX MICROSCOPIC
Bilirubin Urine: NEGATIVE
Leukocytes, UA: NEGATIVE
Nitrite: NEGATIVE
Specific Gravity, Urine: 1.02 (ref 1.005–1.030)
Urobilinogen, UA: 1 mg/dL (ref 0.0–1.0)
pH: 6.5 (ref 5.0–8.0)

## 2011-04-13 LAB — BASIC METABOLIC PANEL
BUN: 12 mg/dL (ref 6–23)
Chloride: 104 mEq/L (ref 96–112)
GFR calc Af Amer: >90 mL/min (ref >90)
GFR calc non Af Amer: >90 mL/min (ref >90)
Potassium: 4.1 mEq/L (ref 3.5–5.1)
Sodium: 141 mEq/L (ref 135–145)

## 2011-04-13 LAB — DIFFERENTIAL
Basophils Absolute: 0 10*3/uL (ref 0.0–0.1)
Basophils Relative: 0 % (ref 0–1)
Eosinophils Absolute: 0.1 10*3/uL (ref 0.0–0.7)
Monocytes Relative: 11 % (ref 3–12)
Neutro Abs: 3.5 10*3/uL (ref 1.7–7.7)
Neutrophils Relative %: 60 % (ref 43–77)

## 2011-04-13 MED ORDER — LORAZEPAM 2 MG/ML IJ SOLN
1.0000 mg | Freq: Once | INTRAMUSCULAR | Status: AC
  Administered 2011-04-13: 1 mg via INTRAVENOUS
  Filled 2011-04-13: qty 1

## 2011-04-13 MED ORDER — DIVALPROEX SODIUM 125 MG PO CPSP
250.0000 mg | ORAL_CAPSULE | Freq: Once | ORAL | Status: DC
  Filled 2011-04-13: qty 2

## 2011-04-13 MED ORDER — SODIUM CHLORIDE 0.9 % IV SOLN
1000.0000 mg | Freq: Once | INTRAVENOUS | Status: AC
  Administered 2011-04-13: 1000 mg via INTRAVENOUS
  Filled 2011-04-13: qty 10

## 2011-04-13 MED ORDER — SODIUM CHLORIDE 0.9 % IV BOLUS (SEPSIS)
1000.0000 mL | Freq: Once | INTRAVENOUS | Status: AC
  Administered 2011-04-13: 1000 mL via INTRAVENOUS

## 2011-04-13 MED ORDER — DIVALPROEX SODIUM 250 MG PO DR TAB
DELAYED_RELEASE_TABLET | ORAL | Status: AC
  Filled 2011-04-13: qty 1

## 2011-04-13 MED ORDER — LEVETIRACETAM 500 MG/5ML IV SOLN
INTRAVENOUS | Status: AC
  Filled 2011-04-13: qty 10

## 2011-04-13 NOTE — ED Notes (Signed)
Assessing patient at this time. Per EMS, patient had some focal\ eye twitching. Has history of [place] seizures but non noted on arrival or en route to ED. States Ativan 1mg  was given IV by them for seizure. No noted twitching at this time. Equal chest rise and fall, regular, unlabored. Pupils reactive and equal at 3 mm bilaterally. Patient nonverbal due to previous stroke. Has some incoherent sounds. Bed in low position and locked with side rails up and seizure pads provided. Vital signs stable. Call bell within reach. Awaiting MD eval. Will continue to monitor.

## 2011-04-13 NOTE — ED Notes (Signed)
Keepra infusion and normal saline infusion complete. Iv saline locked. No signs of infiltration. Iv discontinued with cathlon intact. Bleeding controlled. EMS called to transport patient back to [facility].

## 2011-04-13 NOTE — ED Notes (Signed)
Report given to [facility] at [facility].

## 2011-04-13 NOTE — ED Notes (Signed)
Lab at bedside to draw blood.

## 2011-04-13 NOTE — ED Notes (Signed)
Medicated as ordered. Resting comfortably in bed with eyes open. No distress. Equal chest rise and fall, regular, unlabored. No distress. Call bell within reach.

## 2011-04-13 NOTE — ED Notes (Signed)
MD at bedside to evaluate. Patient not having any additional seizure activity. Resting comfortably. Equal chest rise and fall.

## 2011-04-13 NOTE — ED Notes (Signed)
Medicated per md order. Resting comfortably. No distress. Equal chest rise and fall. Eyes open. No seizure activity. Call bell within reach. Seizure pads in place. Bed in low position and locked with side rails up.

## 2011-04-13 NOTE — ED Provider Notes (Signed)
History     CSN: [id number]  Arrival date & time 04/13/11  0155   First MD Initiated Contact with Patient 04/13/11 0226      Chief Complaint  Patient presents with  . Seizures    (Consider location/radiation/quality/duration/timing/severity/associated sxs/prior treatment) HPI Comments: Patient has a history of seizures for which she takes Ativan, Keppra, Depakote. She's found that her nursing facility to have some deviation of her eyes. They administered medication and discontinued therefore EMS was called. She has a history of similar in the past. She is nonverbal  Patient is a 61 y.o. female presenting with seizures. The history is provided by the nursing home (level 5 caveat). The history is limited by the condition of the patient (non-verbal patient). No language interpreter was used.  Seizures  This is a new problem. The current episode started 1 to 2 hours ago. The problem has not changed since onset.There were 2 to 3 seizures. The most recent episode lasted 30 to 120 seconds. Pertinent negatives include no cough, no vomiting and no diarrhea. Characteristics include eye deviation. Characteristics do not include eye blinking, bowel incontinence, bladder incontinence, rhythmic jerking or bit tongue. The episode was witnessed. The seizures did not continue in the ED. The seizure(s) had no focality. Possible causes do not include med or dosage change, missed seizure meds or recent illness. There has been no fever. Medications administered prior to arrival include lorazepam IV.    Past Medical History  Diagnosis Date  . Multiple sclerosis   . Seizures   . Dysphagia   . Dehydration   . Hypertension   . Hemiparesis, left   . Pneumonia   . Thrombocytopenia   . Aphasia   . Depression   . Hypothyroid   . Esophageal reflux   . Stroke     History reviewed. No pertinent past surgical history.  No family history on file.  History  Substance Use Topics  . Smoking status: Never  Smoker   . Smokeless tobacco: Not on file  . Alcohol Use: No    OB History    Grav Para Term Preterm Abortions TAB SAB Ect Mult Living                  Review of Systems  Unable to perform ROS: Other  Constitutional: Negative for fever.       Pt non-verbal in ED  HENT: Negative for congestion and rhinorrhea.   Respiratory: Negative for cough.   Gastrointestinal: Negative for vomiting, diarrhea and bowel incontinence.  Genitourinary: Negative for bladder incontinence.  Neurological: Positive for seizures.    Allergies  Review of patient's allergies indicates no known allergies.  Home Medications   Current Outpatient Rx  Name Route Sig Dispense Refill  . ASPIRIN 81 MG PO TABS Oral Take 160 mg by mouth daily.    Marland Kitchen DIAZEPAM 5 MG PO TABS Oral Take 5 mg by mouth 2 (two) times daily as needed.    Marland Kitchen DIVALPROEX SODIUM 125 MG PO CPSP Oral Take 125 mg by mouth 3 (three) times daily.    . INTERFERON BETA-1A 30 MCG/0.5ML IM KIT Intramuscular Inject 30 mcg into the muscle every 7 (seven) days.    Marland Kitchen LEVETIRACETAM 1000 MG PO TABS Oral Take 1,000 mg by mouth 2 (two) times daily.    Marland Kitchen LEVOTHYROXINE SODIUM 112 MCG PO TABS Oral Take 112 mcg by mouth daily.    Marland Kitchen LORAZEPAM 0.5 MG PO TABS Oral Take 0.5 mg by mouth at bedtime.    Marland Kitchen  LORAZEPAM 2 MG PO TABS Intramuscular Inject 2 mg into the muscle every 2 (two) hours as needed.    . MULTI-VITAMIN/MINERALS PO TABS Oral Take 1 tablet by mouth daily.    . OXYCODONE HCL 5 MG PO CAPS Oral Take 5 mg by mouth every 6 (six) hours as needed.    Marland Kitchen POLYETHYLENE GLYCOL 3350 PO PACK Oral Take 17 g by mouth daily.    Marland Kitchen RANITIDINE HCL 150 MG PO TABS Oral Take 150 mg by mouth at bedtime.    Marland Kitchen TIZANIDINE HCL 2 MG PO CAPS Oral Take 2 mg by mouth 1 day or 1 dose.      BP 103/83  Pulse 95  Temp(Src) 98.4 F (36.9 C) (Oral)  Resp 18  Ht 5\' 4"  (1.626 m)  Wt 200 lb (90.719 kg)  BMI 34.33 kg/m2  SpO2 98%  Physical Exam  Nursing note and vitals  reviewed. Constitutional: She appears well-developed and well-nourished. No distress.  HENT:  Head: Normocephalic and atraumatic.  Mouth/Throat: Oropharynx is clear and moist.       No tongue lesions  Eyes: Conjunctivae and EOM are normal. Pupils are equal, round, and reactive to light.  Neck: Normal range of motion. Neck supple.  Cardiovascular: Normal rate, regular rhythm, normal heart sounds and intact distal pulses.   Pulmonary/Chest: Effort normal and breath sounds normal. No respiratory distress.  Abdominal: Soft. Bowel sounds are normal. There is no tenderness.  Neurological:       Non-verbal on arrival  Skin: Skin is warm and dry. No rash noted.    ED Course  Procedures (including critical care time)  Labs Reviewed  CBC - Abnormal; Notable for the following:    Platelets 103 (*)    All other components within normal limits  BASIC METABOLIC PANEL - Abnormal; Notable for the following:    Glucose, Bld 104 (*)    All other components within normal limits  URINALYSIS, ROUTINE W REFLEX MICROSCOPIC - Abnormal; Notable for the following:    Color, Urine AMBER (*) BIOCHEMICALS MAY BE AFFECTED BY COLOR   Ketones, ur 15 (*)    All other components within normal limits  DIFFERENTIAL  VALPROIC ACID LEVEL   No results found.   1. Seizure disorder       MDM  Seizures with history of seizure disorder. Glucose is normal. Remainder of her laboratory studies were relatively unremarkable. Depakote within normal levels. I will minister an extra dose of Depakote as well as the IV dose of Keppra. She received 1 mg of Ativan emergency department if she received one prior. There is no indication for further workup at this time. She'll be discharged back to her facility with instructions to followup with her primary care physician        Dayton Bailiff, MD 04/13/11 [phone]

## 2011-04-13 NOTE — Discharge Instructions (Signed)
Seizure, Adult A seizure is when the body shakes uncontrollably (convulsion). It can be a scary experience. A seizure is not a diagnosis. It is a sign that something else may be wrong with brain and/or spinal cord (central nervous system). In the Emergency Department, your condition is evaluated. The seizure is then treated. You will likely need follow-up with your caregiver. You will possibly need further testing and evaluation. Your caregiver or the specialist to whom you are referred will determine if further treatment is needed. After a seizure, you may be confused, dazed and drowsy. These problems (symptoms) often follow a seizure. Medication given to treat the seizure may also cause some of these changes. The time following a seizure is known as a refractory period. Hospital admission is seldom required unless there are other conditions present such as trauma or metabolic problems. Sometimes the seizure activity follows a fainting episode. This may have been caused by a brief drop in blood pressure. These fainting (syncopal) seizures are generally not a cause for concern.  HOME CARE INSTRUCTIONS   Follow up with your caregiver as suggested.   If any problems happen, get help right away.   Do not swim or drive until your caregiver says it is okay.  Document Released: 03/10/2000 Document Revised: 11/23/2010 Document Reviewed: 07/24/2007 [employer] Patient Information 2012 [employer]. 

## 2011-04-13 NOTE — ED Notes (Signed)
Remains resting in bed on back. No distress. No seizure activity. Normal saline infusing well with no signs of infiltration. Call bell at bedside. Bed in low position and locked with side rails up.

## 2011-04-13 NOTE — ED Notes (Signed)
In and out cath preformed using sterile technique x 1 attempt by this nurse. Obtained 13 cc clear, amber colored urine. Patient tolerated well. Labeled at bedside. Sent to lab for testing.

## 2011-04-13 NOTE — ED Notes (Signed)
Resting with eyes closed and lights off. No distress. Equal chest rise and fall, unlabored regular. No facial grimaces. Call bell within reach. Bed in low position and locked with side rails up. Will continue to monitor.

## 2011-04-13 NOTE — ED Notes (Signed)
Pt to er by ems for seizures,  H/o of same, from [facility]

## 2011-09-07 ENCOUNTER — Inpatient Hospital Stay (HOSPITAL_COMMUNITY)
Admission: EM | Admit: 2011-09-07 | Discharge: 2011-09-10 | DRG: 194 | Disposition: A | Discharge status: Skilled Nursing Facility | Payer: Medicare Other | Attending: Internal Medicine | Admitting: Internal Medicine

## 2011-09-07 ENCOUNTER — Encounter (HOSPITAL_COMMUNITY): Payer: Self-pay | Admitting: Emergency Medicine

## 2011-09-07 ENCOUNTER — Emergency Department (HOSPITAL_COMMUNITY): Payer: Medicare Other

## 2011-09-07 DIAGNOSIS — M24576 Contracture, unspecified foot: Secondary | ICD-10-CM | POA: Diagnosis present

## 2011-09-07 DIAGNOSIS — Z7401 Bed confinement status: Secondary | ICD-10-CM

## 2011-09-07 DIAGNOSIS — G35 Multiple sclerosis: Secondary | ICD-10-CM | POA: Diagnosis present

## 2011-09-07 DIAGNOSIS — F329 Major depressive disorder, single episode, unspecified: Secondary | ICD-10-CM | POA: Diagnosis present

## 2011-09-07 DIAGNOSIS — I10 Essential (primary) hypertension: Secondary | ICD-10-CM | POA: Diagnosis present

## 2011-09-07 DIAGNOSIS — I679 Cerebrovascular disease, unspecified: Secondary | ICD-10-CM | POA: Diagnosis present

## 2011-09-07 DIAGNOSIS — F3289 Other specified depressive episodes: Secondary | ICD-10-CM | POA: Diagnosis present

## 2011-09-07 DIAGNOSIS — I69920 Aphasia following unspecified cerebrovascular disease: Secondary | ICD-10-CM

## 2011-09-07 DIAGNOSIS — Z79899 Other long term (current) drug therapy: Secondary | ICD-10-CM

## 2011-09-07 DIAGNOSIS — M24573 Contracture, unspecified ankle: Secondary | ICD-10-CM | POA: Diagnosis present

## 2011-09-07 DIAGNOSIS — J189 Pneumonia, unspecified organism: Principal | ICD-10-CM | POA: Diagnosis present

## 2011-09-07 DIAGNOSIS — E669 Obesity, unspecified: Secondary | ICD-10-CM | POA: Diagnosis present

## 2011-09-07 DIAGNOSIS — R569 Unspecified convulsions: Secondary | ICD-10-CM | POA: Diagnosis present

## 2011-09-07 DIAGNOSIS — G8194 Hemiplegia, unspecified affecting left nondominant side: Secondary | ICD-10-CM

## 2011-09-07 DIAGNOSIS — Z7982 Long term (current) use of aspirin: Secondary | ICD-10-CM

## 2011-09-07 DIAGNOSIS — I69959 Hemiplegia and hemiparesis following unspecified cerebrovascular disease affecting unspecified side: Secondary | ICD-10-CM

## 2011-09-07 DIAGNOSIS — Z6827 Body mass index (BMI) 27.0-27.9, adult: Secondary | ICD-10-CM

## 2011-09-07 DIAGNOSIS — D696 Thrombocytopenia, unspecified: Secondary | ICD-10-CM | POA: Diagnosis present

## 2011-09-07 DIAGNOSIS — I69991 Dysphagia following unspecified cerebrovascular disease: Secondary | ICD-10-CM

## 2011-09-07 DIAGNOSIS — E039 Hypothyroidism, unspecified: Secondary | ICD-10-CM | POA: Diagnosis present

## 2011-09-07 DIAGNOSIS — R4701 Aphasia: Secondary | ICD-10-CM

## 2011-09-07 DIAGNOSIS — K219 Gastro-esophageal reflux disease without esophagitis: Secondary | ICD-10-CM | POA: Diagnosis present

## 2011-09-07 DIAGNOSIS — M6281 Muscle weakness (generalized): Secondary | ICD-10-CM | POA: Diagnosis present

## 2011-09-07 DIAGNOSIS — R509 Fever, unspecified: Secondary | ICD-10-CM

## 2011-09-07 HISTORY — DX: Obesity, unspecified: E66.9

## 2011-09-07 HISTORY — DX: Unspecified lack of coordination: R27.9

## 2011-09-07 HISTORY — DX: Contracture, unspecified ankle: M24.576

## 2011-09-07 HISTORY — DX: Contracture, unspecified ankle: M24.573

## 2011-09-07 HISTORY — DX: Muscle weakness (generalized): M62.81

## 2011-09-07 LAB — CBC
HCT: 35.4 % — ABNORMAL LOW (ref 36.0–46.0)
Hemoglobin: 11.6 g/dL — ABNORMAL LOW (ref 12.0–15.0)
MCH: 30.2 pg (ref 26.0–34.0)
MCHC: 32.8 g/dL (ref 30.0–36.0)
MCV: 92.2 fL (ref 78.0–100.0)

## 2011-09-07 MED ORDER — PIPERACILLIN-TAZOBACTAM 3.375 G IVPB
3.3750 g | Freq: Once | INTRAVENOUS | Status: AC
  Administered 2011-09-08: 3.375 g via INTRAVENOUS
  Filled 2011-09-07: qty 50

## 2011-09-07 MED ORDER — ACETAMINOPHEN 650 MG RE SUPP
650.0000 mg | Freq: Once | RECTAL | Status: AC
  Administered 2011-09-07: 650 mg via RECTAL
  Filled 2011-09-07: qty 1

## 2011-09-07 MED ORDER — SODIUM CHLORIDE 0.9 % IV BOLUS (SEPSIS)
1000.0000 mL | Freq: Once | INTRAVENOUS | Status: AC
  Administered 2011-09-07: 1000 mL via INTRAVENOUS

## 2011-09-07 MED ORDER — VANCOMYCIN HCL IN DEXTROSE 1-5 GM/200ML-% IV SOLN
1000.0000 mg | Freq: Once | INTRAVENOUS | Status: AC
  Administered 2011-09-07: 1000 mg via INTRAVENOUS
  Filled 2011-09-07: qty 200

## 2011-09-07 NOTE — ED Notes (Signed)
Patient presents to ER via EMS from [facility] with c/o fever.  EMS states was 103 at facility; was given a Tylenol suppository and patient had a BM. Patient has a "pimple" on inside of right forearm that staff at [facility] might be an allergic reaction to Tylenol or a spider bite.  MD was contacted and told to send to ER.  Patient is at baseline at this time.

## 2011-09-08 ENCOUNTER — Encounter (HOSPITAL_COMMUNITY): Payer: Self-pay | Admitting: Internal Medicine

## 2011-09-08 DIAGNOSIS — I10 Essential (primary) hypertension: Secondary | ICD-10-CM

## 2011-09-08 DIAGNOSIS — G35 Multiple sclerosis: Secondary | ICD-10-CM | POA: Diagnosis present

## 2011-09-08 DIAGNOSIS — J189 Pneumonia, unspecified organism: Principal | ICD-10-CM | POA: Diagnosis present

## 2011-09-08 DIAGNOSIS — D696 Thrombocytopenia, unspecified: Secondary | ICD-10-CM | POA: Diagnosis present

## 2011-09-08 DIAGNOSIS — F329 Major depressive disorder, single episode, unspecified: Secondary | ICD-10-CM | POA: Diagnosis present

## 2011-09-08 DIAGNOSIS — E039 Hypothyroidism, unspecified: Secondary | ICD-10-CM | POA: Diagnosis present

## 2011-09-08 DIAGNOSIS — G8194 Hemiplegia, unspecified affecting left nondominant side: Secondary | ICD-10-CM | POA: Diagnosis present

## 2011-09-08 DIAGNOSIS — E669 Obesity, unspecified: Secondary | ICD-10-CM | POA: Diagnosis present

## 2011-09-08 DIAGNOSIS — E032 Hypothyroidism due to medicaments and other exogenous substances: Secondary | ICD-10-CM

## 2011-09-08 DIAGNOSIS — M6281 Muscle weakness (generalized): Secondary | ICD-10-CM | POA: Diagnosis present

## 2011-09-08 DIAGNOSIS — R4701 Aphasia: Secondary | ICD-10-CM | POA: Diagnosis present

## 2011-09-08 DIAGNOSIS — R569 Unspecified convulsions: Secondary | ICD-10-CM | POA: Diagnosis present

## 2011-09-08 LAB — URINALYSIS, ROUTINE W REFLEX MICROSCOPIC
Bilirubin Urine: NEGATIVE
Hgb urine dipstick: NEGATIVE
Protein, ur: NEGATIVE mg/dL
Urobilinogen, UA: 4 mg/dL — ABNORMAL HIGH (ref 0.0–1.0)

## 2011-09-08 LAB — COMPREHENSIVE METABOLIC PANEL
Alkaline Phosphatase: 52 U/L (ref 39–117)
BUN: 10 mg/dL (ref 6–23)
GFR calc Af Amer: >90 mL/min (ref >90)
Glucose, Bld: 128 mg/dL — ABNORMAL HIGH (ref 70–99)
Potassium: 3.9 mEq/L (ref 3.5–5.1)
Total Protein: 6.7 g/dL (ref 6.0–8.3)

## 2011-09-08 LAB — CREATININE, SERUM
GFR calc Af Amer: >90 mL/min (ref >90)
GFR calc non Af Amer: >90 mL/min (ref >90)

## 2011-09-08 LAB — LACTIC ACID, PLASMA: Lactic Acid, Venous: 1.9 mmol/L (ref 0.5–2.2)

## 2011-09-08 LAB — CBC
HCT: 35.2 % — ABNORMAL LOW (ref 36.0–46.0)
Platelets: 91 10*3/uL — ABNORMAL LOW (ref 150–400)
RBC: 3.77 MIL/uL — ABNORMAL LOW (ref 3.87–5.11)
RDW: 15.6 % — ABNORMAL HIGH (ref 11.5–15.5)
WBC: 8.6 10*3/uL (ref 4.0–10.5)

## 2011-09-08 LAB — STREP PNEUMONIAE URINARY ANTIGEN: Strep Pneumo Urinary Antigen: NEGATIVE

## 2011-09-08 MED ORDER — VANCOMYCIN HCL 1000 MG IV SOLR
750.0000 mg | Freq: Two times a day (BID) | INTRAVENOUS | Status: DC
  Filled 2011-09-08 (×7): qty 750

## 2011-09-08 MED ORDER — OLOPATADINE HCL 0.1 % OP SOLN
1.0000 [drp] | Freq: Every day | OPHTHALMIC | Status: DC | PRN
  Filled 2011-09-08: qty 5

## 2011-09-08 MED ORDER — LEVETIRACETAM 500 MG PO TABS
2000.0000 mg | ORAL_TABLET | Freq: Two times a day (BID) | ORAL | Status: DC
  Administered 2011-09-08 – 2011-09-10 (×5): 2000 mg via ORAL
  Filled 2011-09-08 (×5): qty 4

## 2011-09-08 MED ORDER — LEVETIRACETAM 750 MG PO TABS
2000.0000 mg | ORAL_TABLET | Freq: Two times a day (BID) | ORAL | Status: DC
  Filled 2011-09-08: qty 1

## 2011-09-08 MED ORDER — LEVOFLOXACIN IN D5W 750 MG/150ML IV SOLN
750.0000 mg | INTRAVENOUS | Status: DC
  Administered 2011-09-08 – 2011-09-09 (×2): 750 mg via INTRAVENOUS
  Filled 2011-09-08 (×3): qty 150

## 2011-09-08 MED ORDER — TIZANIDINE HCL 2 MG PO TABS
1.0000 mg | ORAL_TABLET | ORAL | Status: DC
  Filled 2011-09-08 (×4): qty 0.5

## 2011-09-08 MED ORDER — ENOXAPARIN SODIUM 40 MG/0.4ML ~~LOC~~ SOLN
40.0000 mg | SUBCUTANEOUS | Status: DC
  Administered 2011-09-08: 40 mg via SUBCUTANEOUS
  Filled 2011-09-08: qty 0.4

## 2011-09-08 MED ORDER — TIZANIDINE HCL 4 MG PO TABS
2.0000 mg | ORAL_TABLET | Freq: Every day | ORAL | Status: DC
  Administered 2011-09-08 – 2011-09-09 (×2): 2 mg via ORAL
  Filled 2011-09-08 (×3): qty 1

## 2011-09-08 MED ORDER — ASPIRIN 81 MG PO CHEW
81.0000 mg | CHEWABLE_TABLET | Freq: Every day | ORAL | Status: DC
  Administered 2011-09-09 – 2011-09-10 (×2): 81 mg via ORAL
  Filled 2011-09-08 (×2): qty 1

## 2011-09-08 MED ORDER — FAMOTIDINE 20 MG PO TABS
20.0000 mg | ORAL_TABLET | Freq: Two times a day (BID) | ORAL | Status: DC
  Administered 2011-09-08 – 2011-09-10 (×5): 20 mg via ORAL
  Filled 2011-09-08 (×5): qty 1

## 2011-09-08 MED ORDER — DIAZEPAM 5 MG PO TABS
5.0000 mg | ORAL_TABLET | Freq: Two times a day (BID) | ORAL | Status: DC | PRN
  Administered 2011-09-09: 5 mg via ORAL
  Filled 2011-09-08 (×2): qty 1

## 2011-09-08 MED ORDER — INTERFERON BETA-1A 30 MCG/0.5ML IM KIT: 30.0000 ug | PACK | INTRAMUSCULAR | Status: DC

## 2011-09-08 MED ORDER — POTASSIUM CHLORIDE IN NACL 20-0.9 MEQ/L-% IV SOLN
INTRAVENOUS | Status: DC
  Administered 2011-09-08: 06:00:00 via INTRAVENOUS

## 2011-09-08 MED ORDER — LEVOTHYROXINE SODIUM 112 MCG PO TABS
112.0000 ug | ORAL_TABLET | Freq: Every day | ORAL | Status: DC
  Administered 2011-09-08 – 2011-09-10 (×3): 112 ug via ORAL
  Filled 2011-09-08 (×4): qty 1

## 2011-09-08 MED ORDER — POTASSIUM CHLORIDE IN NACL 20-0.9 MEQ/L-% IV SOLN: INTRAVENOUS | Status: AC

## 2011-09-08 MED ORDER — ASPIRIN 81 MG PO CHEW
160.0000 mg | CHEWABLE_TABLET | Freq: Every day | ORAL | Status: DC
  Administered 2011-09-08: 162 mg via ORAL
  Filled 2011-09-08 (×4): qty 2

## 2011-09-08 MED ORDER — DIVALPROEX SODIUM 125 MG PO CPSP
500.0000 mg | ORAL_CAPSULE | Freq: Three times a day (TID) | ORAL | Status: DC
  Administered 2011-09-08 – 2011-09-10 (×7): 500 mg via ORAL
  Filled 2011-09-08 (×11): qty 4

## 2011-09-08 MED ORDER — OXYCODONE HCL 5 MG PO TABS
10.0000 mg | ORAL_TABLET | Freq: Three times a day (TID) | ORAL | Status: DC
  Administered 2011-09-08 – 2011-09-10 (×6): 10 mg via ORAL
  Filled 2011-09-08 (×6): qty 2

## 2011-09-08 MED ORDER — POLYETHYLENE GLYCOL 3350 17 G PO PACK
17.0000 g | PACK | Freq: Every day | ORAL | Status: DC
  Administered 2011-09-08 – 2011-09-10 (×3): 17 g via ORAL
  Filled 2011-09-08 (×3): qty 1

## 2011-09-08 MED ORDER — CEFEPIME HCL 1 G IJ SOLR
1.0000 g | Freq: Two times a day (BID) | INTRAMUSCULAR | Status: DC
  Administered 2011-09-08 – 2011-09-09 (×3): 1 g via INTRAVENOUS
  Filled 2011-09-08 (×7): qty 1

## 2011-09-08 NOTE — Progress Notes (Signed)
ANTIBIOTIC CONSULT NOTE - INITIAL  Pharmacy Consult for Vancomycin, Cefepime Indication: pneumonia  No Known Allergies  Patient Measurements: Height: 5\' 3"  (160 cm) Weight: 148 lb 5.9 oz (67.3 kg) IBW/kg (Calculated) : 52.4   Vital Signs: Temp: 97.8 F (36.6 C) (06/14 0606) Temp src: Axillary (06/14 0606) BP: 108/74 mmHg (06/14 0606) Pulse Rate: 60  (06/14 0606) Intake/Output from previous day:   Intake/Output from this shift:    Labs:  Basename 09/08/11 0555 09/07/11 2330  WBC 8.6 7.0  HGB 11.4* 11.6*  PLT 91* 93*  LABCREA -- --  CREATININE 0.68 0.68   Estimated Creatinine Clearance: 68.9 ml/min (by C-G formula based on Cr of 0.68). No results found for this basename: VANCOTROUGH:2,VANCOPEAK:2,VANCORANDOM:2,GENTTROUGH:2,GENTPEAK:2,GENTRANDOM:2,TOBRATROUGH:2,TOBRAPEAK:2,TOBRARND:2,AMIKACINPEAK:2,AMIKACINTROU:2,AMIKACIN:2, in the last 72 hours   Microbiology: Recent Results (from the past 720 hour(s))  CULTURE, BLOOD (ROUTINE X 2)     Status: Normal (Preliminary result)   Collection Time   09/08/11  5:55 AM      Component Value Range Status Comment   Specimen Description Blood RIGHT ARM   Final    Special Requests NONE 6CC   Final    Culture NO GROWTH <24 HRS   Final    Report Status PENDING   Incomplete   CULTURE, BLOOD (ROUTINE X 2)     Status: Normal (Preliminary result)   Collection Time   09/08/11  5:56 AM      Component Value Range Status Comment   Specimen Description Blood LEFT ARM   Final    Special Requests NONE 6CC   Final    Culture NO GROWTH <24 HRS   Final    Report Status PENDING   Incomplete     Medical History: Past Medical History  Diagnosis Date  . Multiple sclerosis   . Seizures   . Dysphagia   . Dehydration   . Hypertension   . Hemiparesis, left   . Pneumonia   . Thrombocytopenia   . Aphasia   . Depression   . Hypothyroid   . Esophageal reflux   . Stroke   . Lack of coordination   . Contracture of ankle and foot joint   .  Obesity   . Muscle weakness     Medications:  Scheduled:    . acetaminophen  650 mg Rectal Once  . aspirin  162 mg Oral Daily  . ceFEPime (MAXIPIME) IV  1 g Intravenous Q12H  . divalproex  500 mg Oral TID  . enoxaparin  40 mg Subcutaneous Q24H  . famotidine  20 mg Oral BID  . interferon beta-1a  30 mcg Intramuscular Q7 days  . levETIRAcetam  2,000 mg Oral BID  . levofloxacin (LEVAQUIN) IV  750 mg Intravenous Q24H  . levothyroxine  112 mcg Oral Daily  . oxyCODONE  10 mg Oral Q8H  . piperacillin-tazobactam (ZOSYN)  IV  3.375 g Intravenous Once  . polyethylene glycol  17 g Oral Daily  . sodium chloride  1,000 mL Intravenous Once  . tiZANidine  1 mg Oral 1 day or 1 dose  . vancomycin  750 mg Intravenous Q12H  . vancomycin  1,000 mg Intravenous Once   Assessment: 61 yo F admitted from nursing home with fever and PNA per CXR to start on empiric broad spectrum antibiotics with Vancomycin, Cefepime, and Levaquin.   On arrival to ED last night she received Vancomycin 1gm at 2345 and Zosyn 3.375gm at 0100.  Her renal function is at baseline. She is bedridden so her CrCl  is possibly slightly overestimated.  Blood cx pending.  Goal of Therapy:  Vancomycin trough level 15-20 mcg/ml  Plan:  1) Levaquin 750mg  IV Q24h 2) Cefepime 1gm IV Q12h 3) Vancomycin 750mg  IV Q12h 4) Check Vancomycin trough at steady state 5) Monitor renal function and cx data   Elson Clan 09/08/2011,7:55 AM

## 2011-09-08 NOTE — Clinical Social Work Note (Signed)
Clinical Social Work Department BRIEF PSYCHOSOCIAL ASSESSMENT 09/08/2011  Patient:  AMIAYAH, GIEBEL     Account Number:  [id number]     Admit date:  09/07/2011  Clinical Social Worker:  Sherrlyn Hock  Date/Time:  09/08/2011 10:00 AM  Referred by:  Physician  Date Referred:  09/08/2011 Referred for  SNF Placement   Other Referral:   Interview type:  Family Other interview type:   sister    PSYCHOSOCIAL DATA Living Status:  FACILITY Admitted from facility:   Level of care:  Skilled Nursing Facility Primary support name:  [name] Primary support relationship to patient:  SIBLING Degree of support available:   adequate    CURRENT CONCERNS Current Concerns  Post-Acute Placement   Other Concerns:    SOCIAL WORK ASSESSMENT / PLAN CSW spoke with pt's sister [name] as pt is not oriented and nonverbal.  [name] is concerned about pt but is unable to visit as frequently due to her own health issues per facility.  Pt has been resident at [facility] for over 10 years.  She is total care per RN. CSW spoke with Zella Ball at facility who reports okay for pt to return at d/c.   Assessment/plan status:  Psychosocial Support/Ongoing Assessment of Needs Other assessment/ plan:   Information/referral to community resources:   [employer]    PATIENT'S/FAMILY'S RESPONSE TO PLAN OF CARE: Pt unable to discuss plan of care.  Family report positive feelings regarding return to [facility] when medically stable.  CSW will continue to follow.        Derenda Fennel, [place] [phone]

## 2011-09-08 NOTE — ED Provider Notes (Signed)
History     CSN: [id number]  Arrival date & time 09/07/11  2252   First MD Initiated Contact with Patient 09/07/11 2253      Chief Complaint  Patient presents with  . Fever    (Consider location/radiation/quality/duration/timing/severity/associated sxs/prior treatment) HPI History provided by EMS and nursing facility. Patient has baseline aphasia and unable to provide any history. Level V caveat applies. Reported fever at nursing facility today. Patient was given antibiotics and sent here for further evaluation. Patient was also given Tylenol prior to arrival and then shortly afterwards had a bowel movement - per report nurse felt patient did not receive any adequate Tylenol for fever. Does have a small pimple on right forearm, but no rash otherwise. No vomiting. No diarrhea. No respiratory distress or coughing reported. Past Medical History  Diagnosis Date  . Multiple sclerosis   . Seizures   . Dysphagia   . Dehydration   . Hypertension   . Hemiparesis, left   . Pneumonia   . Thrombocytopenia   . Aphasia   . Depression   . Hypothyroid   . Esophageal reflux   . Stroke   . Lack of coordination   . Contracture of ankle and foot joint   . Obesity   . Muscle weakness     History reviewed. No pertinent past surgical history.  No family history on file.  History  Substance Use Topics  . Smoking status: Never Smoker   . Smokeless tobacco: Not on file  . Alcohol Use: No    OB History    Grav Para Term Preterm Abortions TAB SAB Ect Mult Living                  Review of Systems  Unable to perform ROS level V caveat as above  Allergies  Review of patient's allergies indicates no known allergies.  Home Medications   Current Outpatient Rx  Name Route Sig Dispense Refill  . ASPIRIN 81 MG PO TABS Oral Take 160 mg by mouth daily.    Marland Kitchen CLONAZEPAM 0.5 MG PO TABS Oral Take 0.5 mg by mouth 2 (two) times daily as needed.    Marland Kitchen DIAZEPAM 5 MG PO TABS Oral Take 5 mg by  mouth 2 (two) times daily as needed.    Marland Kitchen DIVALPROEX SODIUM 125 MG PO CPSP Oral Take 500 mg by mouth 3 (three) times daily.     . INTERFERON BETA-1A 30 MCG/0.5ML IM KIT Intramuscular Inject 30 mcg into the muscle every 7 (seven) days.    Marland Kitchen LEVETIRACETAM 1000 MG PO TABS Oral Take 2,000 mg by mouth 2 (two) times daily.     Marland Kitchen LEVOFLOXACIN 500 MG PO TABS Oral Take 500 mg by mouth daily.    Marland Kitchen LEVOTHYROXINE SODIUM 112 MCG PO TABS Oral Take 112 mcg by mouth daily.    Marland Kitchen LORAZEPAM 0.5 MG PO TABS Oral Take 0.5 mg by mouth at bedtime.    Marland Kitchen LORAZEPAM 2 MG PO TABS Intramuscular Inject 2 mg into the muscle every 2 (two) hours as needed.    . MULTI-VITAMIN/MINERALS PO TABS Oral Take 1 tablet by mouth daily.    . OLOPATADINE HCL 0.1 % OP SOLN Both Eyes Place 1 drop into both eyes daily as needed.    . OXYCODONE HCL 5 MG PO CAPS Oral Take 10 mg by mouth every 8 (eight) hours.     Marland Kitchen POLYETHYLENE GLYCOL 3350 PO PACK Oral Take 17 g by mouth daily.    Marland Kitchen  RANITIDINE HCL 150 MG PO TABS Oral Take 150 mg by mouth at bedtime.    Marland Kitchen TIZANIDINE HCL 2 MG PO CAPS Oral Take 1 mg by mouth 1 day or 1 dose.       BP 100/56  Pulse 67  Temp 101.9 F (38.8 C) (Rectal)  Resp 16  SpO2 98%  Physical Exam  Constitutional: She appears well-developed and well-nourished.  HENT:  Head: Normocephalic and atraumatic.       Dry mucous membranes. No aphasia  Eyes: Conjunctivae are normal. Pupils are equal, round, and reactive to light.  Neck: Neck supple. No JVD present. No tracheal deviation present.  Cardiovascular: Normal rate and regular rhythm.   Pulmonary/Chest: Effort normal and breath sounds normal. No respiratory distress. She has no wheezes. She has no rales. She exhibits no tenderness.  Abdominal: Soft. Bowel sounds are normal. She exhibits no mass. There is no tenderness. There is no rebound and no guarding.  Musculoskeletal: She exhibits no edema and no tenderness.  Neurological:       Awake. Aphasia - baseline.     Skin: Skin is warm and dry.       Very small area of raised pustule right forearm. No surrounding erythema or increased warmth to touch. Distal neurovascular intact. No associated lymphadenopathy or streaking.    ED Course  Procedures (including critical care time)  Results for orders placed during the hospital encounter of 09/07/11  CBC      Component Value Range   WBC 7.0  4.0 - 10.5 K/uL   RBC 3.84 (*) 3.87 - 5.11 MIL/uL   Hemoglobin 11.6 (*) 12.0 - 15.0 g/dL   HCT 40.9 (*) 81.1 - 91.4 %   MCV 92.2  78.0 - 100.0 fL   MCH 30.2  26.0 - 34.0 pg   MCHC 32.8  30.0 - 36.0 g/dL   RDW 78.2  95.6 - 21.3 %   Platelets 93 (*) 150 - 400 K/uL  COMPREHENSIVE METABOLIC PANEL      Component Value Range   Sodium 141  135 - 145 mEq/L   Potassium 3.9  3.5 - 5.1 mEq/L   Chloride 104  96 - 112 mEq/L   CO2 28  19 - 32 mEq/L   Glucose, Bld 128 (*) 70 - 99 mg/dL   BUN 10  6 - 23 mg/dL   Creatinine, Ser 0.86  0.50 - 1.10 mg/dL   Calcium 9.7  8.4 - 57.8 mg/dL   Total Protein 6.7  6.0 - 8.3 g/dL   Albumin 2.9 (*) 3.5 - 5.2 g/dL   AST 38 (*) 0 - 37 U/L   ALT 22  0 - 35 U/L   Alkaline Phosphatase 52  39 - 117 U/L   Total Bilirubin 0.2 (*) 0.3 - 1.2 mg/dL   GFR calc non Af Amer >90  >90 mL/min   GFR calc Af Amer >90  >90 mL/min  LACTIC ACID, PLASMA      Component Value Range   Lactic Acid, Venous 1.9  0.5 - 2.2 mmol/L  PROCALCITONIN      Component Value Range   Procalcitonin <0.10    URINALYSIS, ROUTINE W REFLEX MICROSCOPIC      Component Value Range   Color, Urine YELLOW  YELLOW   APPearance CLEAR  CLEAR   Specific Gravity, Urine 1.020  1.005 - 1.030   pH 6.0  5.0 - 8.0   Glucose, UA NEGATIVE  NEGATIVE mg/dL   Hgb urine dipstick NEGATIVE  NEGATIVE   Bilirubin Urine NEGATIVE  NEGATIVE   Ketones, ur TRACE (*) NEGATIVE mg/dL   Protein, ur NEGATIVE  NEGATIVE mg/dL   Urobilinogen, UA 4.0 (*) 0.0 - 1.0 mg/dL   Nitrite NEGATIVE  NEGATIVE   Leukocytes, UA NEGATIVE  NEGATIVE   Dg Chest  Portable 1 View  09/07/2011  *RADIOLOGY REPORT*  Clinical Data: Fever.  Hypertension and stroke.  PORTABLE CHEST - 1 VIEW  Comparison: 08/31/2008  Findings: The chin overlies the apices. Normal heart size.  Lung volumes are low. No pleural effusion or pneumothorax.  Clear left lung.  Subtle opacity in the right mid lung laterally.  IMPRESSION: Subtle right upper lobe opacity.   This is superimposed upon low lung volumes on this AP portable film. Most likely an area of atelectasis or infection.  Consider short-term radiographic follow- up versus further characterization with PA and lateral radiographs. Also recommend plain film follow-up to resolution to exclude underlying nodule.  Original Report Authenticated By: Consuello Bossier, M.D.      1. Healthcare-associated pneumonia   2. Fever   3. Multiple sclerosis   4. Seizures   5. Hemiparesis, left   6. Hypertension   7. Hypothyroid   8. Depression   9. Aphasia   10. Obesity   11. Muscle weakness    IV fluids. IV antibiotics. Labs and imaging obtained. Tylenol for fever.   MDM   Fever with infiltrate on chest x-ray as above. IV antibiotics to cover for healthcare associated pneumonia. Labs reviewed as above.Discussed with Dr. Orvan Falconer on call for triad hospitalist who agrees to admission.nursing notes reviewed. Old records reviewed.medical admission.        Sunnie Nielsen, MD 09/08/11 [phone]

## 2011-09-08 NOTE — ED Notes (Signed)
Patient not responding verbally - per baseline.  Appears to be sleeping.  Skin warm and dry  Color good.  IV site assessed clean, dry and intact.

## 2011-09-08 NOTE — ED Notes (Signed)
More alert now than at any previous time in ED.  Transported by tech to room 336

## 2011-09-08 NOTE — ED Notes (Signed)
Dr. Orvan Falconer back to the ED and admitting orders received.

## 2011-09-08 NOTE — Progress Notes (Signed)
UR chart review completed.  

## 2011-09-08 NOTE — Progress Notes (Addendum)
Subjective: Patient is pleasant.  Due to her MS is not able to communicate.  Objective: Vital signs in last 24 hours: Filed Vitals:   09/08/11 0420 09/08/11 0521 09/08/11 0606 09/08/11 1035  BP: 96/54 118/76 108/74 137/85  Pulse: 68  60 66  Temp:   97.8 F (36.6 C) 96.3 F (35.7 C)  TempSrc:   Axillary Axillary  Resp:   20 20  Height:   5\' 3"  (1.6 m)   Weight:   67.3 kg (148 lb 5.9 oz)   SpO2: 97%  95%    Weight change:   Intake/Output Summary (Last 24 hours) at 09/08/11 1257 Last data filed at 09/08/11 0830  Gross per 24 hour  Intake    240 ml  Output      0 ml  Net    240 ml    Physical Exam: General: Awake, not oriented, No acute distress. HEENT: EOMI. Neck: Supple CV: S1 and S2 Lungs: Clear to ascultation bilaterally Abdomen: Soft, Nontender, Nondistended, +bowel sounds. Ext: Good pulses. Trace edema.  Lab Results: Basic Metabolic Panel:  Lab 09/08/11 1610 09/07/11 2330  NA -- 141  K -- 3.9  CL -- 104  CO2 -- 28  GLUCOSE -- 128*  BUN -- 10  CREATININE 0.68 0.68  CALCIUM -- 9.7  MG -- --  PHOS -- --   Liver Function Tests:  Lab 09/07/11 2330  AST 38*  ALT 22  ALKPHOS 52  BILITOT 0.2*  PROT 6.7  ALBUMIN 2.9*   No results found for this basename: LIPASE:5,AMYLASE:5 in the last 168 hours No results found for this basename: AMMONIA:5 in the last 168 hours CBC:  Lab 09/08/11 0555 09/07/11 2330  WBC 8.6 7.0  NEUTROABS -- --  HGB 11.4* 11.6*  HCT 35.2* 35.4*  MCV 93.4 92.2  PLT 91* 93*   Cardiac Enzymes: No results found for this basename: CKTOTAL:5,CKMB:5,CKMBINDEX:5,TROPONINI:5 in the last 168 hours BNP (last 3 results) No results found for this basename: PROBNP:3 in the last 8760 hours CBG: No results found for this basename: GLUCAP:5 in the last 168 hours No results found for this basename: HGBA1C:5 in the last 72 hours Other Labs: No components found with this basename: POCBNP:3 No results found for this basename: DDIMER:2 in the  last 168 hours No results found for this basename: CHOL:2,HDL:2,LDLCALC:2,TRIG:2,CHOLHDL:2,LDLDIRECT:2 in the last 168 hours No results found for this basename: TSH,T4TOTAL,FREET3,T3FREE,FREET4,THYROIDAB in the last 168 hours No results found for this basename: VITAMINB12:2,FOLATE:2,FERRITIN:2,TIBC:2,IRON:2,RETICCTPCT:2 in the last 168 hours  Micro Results: Recent Results (from the past 240 hour(s))  CULTURE, BLOOD (ROUTINE X 2)     Status: Normal (Preliminary result)   Collection Time   09/08/11  5:55 AM      Component Value Range Status Comment   Specimen Description Blood RIGHT ARM   Final    Special Requests NONE 6CC   Final    Culture NO GROWTH <24 HRS   Final    Report Status PENDING   Incomplete   CULTURE, BLOOD (ROUTINE X 2)     Status: Normal (Preliminary result)   Collection Time   09/08/11  5:56 AM      Component Value Range Status Comment   Specimen Description Blood LEFT ARM   Final    Special Requests NONE 6CC   Final    Culture NO GROWTH <24 HRS   Final    Report Status PENDING   Incomplete   MRSA PCR SCREENING     Status:  Normal   Collection Time   09/08/11  7:55 AM      Component Value Range Status Comment   MRSA by PCR NEGATIVE  NEGATIVE Final     Studies/Results: Dg Chest Portable 1 View  09/07/2011  *RADIOLOGY REPORT*  Clinical Data: Fever.  Hypertension and stroke.  PORTABLE CHEST - 1 VIEW  Comparison: 08/31/2008  Findings: The chin overlies the apices. Normal heart size.  Lung volumes are low. No pleural effusion or pneumothorax.  Clear left lung.  Subtle opacity in the right mid lung laterally.  IMPRESSION: Subtle right upper lobe opacity.   This is superimposed upon low lung volumes on this AP portable film. Most likely an area of atelectasis or infection.  Consider short-term radiographic follow- up versus further characterization with PA and lateral radiographs. Also recommend plain film follow-up to resolution to exclude underlying nodule.  Original Report  Authenticated By: Consuello Bossier, M.D.    Medications: I have reviewed the patient's current medications. Scheduled Meds:   . acetaminophen  650 mg Rectal Once  . aspirin  81 mg Oral Daily  . ceFEPime (MAXIPIME) IV  1 g Intravenous Q12H  . divalproex  500 mg Oral TID  . enoxaparin  40 mg Subcutaneous Q24H  . famotidine  20 mg Oral BID  . interferon beta-1a  30 mcg Intramuscular Q7 days  . levETIRAcetam  2,000 mg Oral BID  . levofloxacin (LEVAQUIN) IV  750 mg Intravenous Q24H  . levothyroxine  112 mcg Oral Daily  . oxyCODONE  10 mg Oral Q8H  . piperacillin-tazobactam (ZOSYN)  IV  3.375 g Intravenous Once  . polyethylene glycol  17 g Oral Daily  . sodium chloride  1,000 mL Intravenous Once  . tiZANidine  2 mg Oral QHS  . vancomycin  1,000 mg Intravenous Once  . DISCONTD: aspirin  162 mg Oral Daily  . DISCONTD: levETIRAcetam  2,000 mg Oral BID  . DISCONTD: tiZANidine  1 mg Oral 1 day or 1 dose  . DISCONTD: vancomycin  750 mg Intravenous Q12H   Continuous Infusions:   . 0.9 % NaCl with KCl 20 mEq / L    . DISCONTD: 0.9 % NaCl with KCl 20 mEq / L 75 mL/hr at 09/08/11 0622   PRN Meds:.diazepam, olopatadine  Assessment/Plan: Pneumonia, HCAP Discontinue vancomycin.  Continue cefepime and levofloxacin.  Culture showed no growth.  Discussed with nurse who reported that the patient has not had difficulty swallowing or eating food, do not see the need for speech evaluation for swallowing at this time.  Depending on patient's clinical progress consider discontinuing cefepime in next 1-2 days and treating the patient on levofloxacin.  Antibiotics since 09/07/2011.  Will get a repeat chest x-ray in the morning.  Fever Due to pneumonia.  Improved.  Multiple sclerosis Stable.  Continue home medications.  History of seizures Stable.  Hypothyroidism Continue levothyroxine.  Aphasia Stable.  Generalized weakness due to  MS Stable.  Hypertension Stable.  Thrombocytopenia Chronic, platelet count stable.  Prophylaxis Discontinue Lovenox start SCDs.  CODE STATUS Full code.  Disposition Pending.   LOS: 1 day  Caitlin Hillmer A, MD 09/08/2011, 12:57 PM

## 2011-09-08 NOTE — H&P (Signed)
PCP:  [name] M.D.  CODE STATUS:  full code  Chief Complaint:  Fever  HPI: [name] is an 61 y.o. female.   Obese, nonverbal,  African American lady, bedridden with multiple sclerosis, left hemiplegia, recurrent aspiration, sent to the emergency room because of a fever of 103. In the emergency room her temperature was noted to be 101.9, and a chest x-ray suggested pneumonia. Hospitalist service was called to assist with management  Rewiew of Systems:  Unable to obtain because of patient's mental status.   Past Medical History  Diagnosis Date  . Multiple sclerosis   . Seizures   . Dysphagia   . Dehydration   . Hypertension   . Hemiparesis, left   . Pneumonia   . Thrombocytopenia   . Aphasia   . Depression   . Hypothyroid   . Esophageal reflux   . Stroke   . Lack of coordination   . Contracture of ankle and foot joint   . Obesity   . Muscle weakness     History reviewed. No pertinent past surgical history.  Medications:  HOME MEDS: Prior to Admission medications   Medication Sig Start Date End Date Taking? Authorizing Provider  aspirin 81 MG tablet Take 160 mg by mouth daily.   Yes Historical Provider, MD  clonazePAM (KLONOPIN) 0.5 MG tablet Take 0.5 mg by mouth 2 (two) times daily as needed.   Yes Historical Provider, MD  diazepam (VALIUM) 5 MG tablet Take 5 mg by mouth 2 (two) times daily as needed.   Yes Historical Provider, MD  divalproex (DEPAKOTE SPRINKLE) 125 MG capsule Take 500 mg by mouth 3 (three) times daily.    Yes Historical Provider, MD  interferon beta-1a (AVONEX) 30 MCG/0.5ML injection Inject 30 mcg into the muscle every 7 (seven) days.   Yes Historical Provider, MD  levETIRAcetam (KEPPRA) 1000 MG tablet Take 2,000 mg by mouth 2 (two) times daily.    Yes Historical Provider, MD  levofloxacin (LEVAQUIN) 500 MG tablet Take 500 mg by mouth daily.   Yes Historical Provider, MD  levothyroxine (SYNTHROID, LEVOTHROID) 112 MCG tablet Take 112 mcg by  mouth daily.   Yes Historical Provider, MD  LORazepam (ATIVAN) 0.5 MG tablet Take 0.5 mg by mouth at bedtime.   Yes Historical Provider, MD  LORazepam (ATIVAN) 2 MG tablet Inject 2 mg into the muscle every 2 (two) hours as needed.   Yes Historical Provider, MD  Multiple Vitamins-Minerals (MULTIVITAMIN WITH MINERALS) tablet Take 1 tablet by mouth daily.   Yes Historical Provider, MD  olopatadine (PATANOL) 0.1 % ophthalmic solution Place 1 drop into both eyes daily as needed.   Yes Historical Provider, MD  oxycodone (OXY-IR) 5 MG capsule Take 10 mg by mouth every 8 (eight) hours.    Yes Historical Provider, MD  polyethylene glycol (MIRALAX / GLYCOLAX) packet Take 17 g by mouth daily.   Yes Historical Provider, MD  ranitidine (ZANTAC) 150 MG tablet Take 150 mg by mouth at bedtime.   Yes Historical Provider, MD  tizanidine (ZANAFLEX) 2 MG capsule Take 1 mg by mouth 1 day or 1 dose.    Yes Historical Provider, MD     Allergies:  No Known Allergies  Social History:   reports that she has never smoked. She does not have any smokeless tobacco history on file. She reports that she does not drink alcohol or use illicit drugs.  Family History: No family history on file.   Physical Exam: Filed Vitals:  09/07/11 2340 09/08/11 0104 09/08/11 0200 09/08/11 0213  BP:   95/64 100/56  Pulse:  71 75 67  Temp: 101.9 F (38.8 C)     TempSrc: Rectal     Resp:      SpO2:  97% 97% 98%   Blood pressure 100/56, pulse 67, temperature 101.9 F (38.8 C), temperature source Rectal, resp. rate 16, SpO2 98.00%.  GEN:  Alert but unresponsive obese African American lady person lying in the stretcher in no acute distress; keeps eyes closed; unable to be cooperative with exam PSYCH:  Unable to assess orientation; HEENT: Mucous membranes pink, dry, and anicteric; PERRLA; EOM intact; no cervical lymphadenopathy nor thyromegaly or carotid bruit; no JVD; Breasts:: Not examined CHEST WALL: No tenderness CHEST:  Normal respiration, clear to auscultation bilaterally HEART: Regular rate and rhythm; no murmurs rubs or gallops BACK: no CVA tenderness ABDOMEN: Obese, soft non-tender; no masses, no organomegaly, normal abdominal bowel sounds; large pannus; right-sided abdominal intertriginous candida. Rectal Exam: Not done EXTREMITIES: ; a flexion contracture of the ankles; no edema; no ulcerations. Genitalia: not examined PULSES: 2+ and symmetric SKIN: Normal hydration no rash or ulceration CNS: Cranial nerves left facial weakness; lateral lower extremity weakness unable to actively assess    Labs & Imaging Results for orders placed during the hospital encounter of 09/07/11 (from the past 48 hour(s))  LACTIC ACID, PLASMA     Status: Normal   Collection Time   09/07/11 11:01 PM      Component Value Range Comment   Lactic Acid, Venous 1.9  0.5 - 2.2 mmol/L   CBC     Status: Abnormal   Collection Time   09/07/11 11:30 PM      Component Value Range Comment   WBC 7.0  4.0 - 10.5 K/uL    RBC 3.84 (*) 3.87 - 5.11 MIL/uL    Hemoglobin 11.6 (*) 12.0 - 15.0 g/dL    HCT 16.1 (*) 09.6 - 46.0 %    MCV 92.2  78.0 - 100.0 fL    MCH 30.2  26.0 - 34.0 pg    MCHC 32.8  30.0 - 36.0 g/dL    RDW 04.5  40.9 - 81.1 %    Platelets 93 (*) 150 - 400 K/uL   COMPREHENSIVE METABOLIC PANEL     Status: Abnormal   Collection Time   09/07/11 11:30 PM      Component Value Range Comment   Sodium 141  135 - 145 mEq/L    Potassium 3.9  3.5 - 5.1 mEq/L    Chloride 104  96 - 112 mEq/L    CO2 28  19 - 32 mEq/L    Glucose, Bld 128 (*) 70 - 99 mg/dL    BUN 10  6 - 23 mg/dL    Creatinine, Ser 9.14  0.50 - 1.10 mg/dL    Calcium 9.7  8.4 - 78.2 mg/dL    Total Protein 6.7  6.0 - 8.3 g/dL    Albumin 2.9 (*) 3.5 - 5.2 g/dL    AST 38 (*) 0 - 37 U/L    ALT 22  0 - 35 U/L    Alkaline Phosphatase 52  39 - 117 U/L    Total Bilirubin 0.2 (*) 0.3 - 1.2 mg/dL    GFR calc non Af Amer >90  >90 mL/min    GFR calc Af Amer >90  >90 mL/min     PROCALCITONIN     Status: Normal   Collection Time  09/07/11 11:31 PM      Component Value Range Comment   Procalcitonin <0.10     URINALYSIS, ROUTINE W REFLEX MICROSCOPIC     Status: Abnormal   Collection Time   09/07/11 11:50 PM      Component Value Range Comment   Color, Urine YELLOW  YELLOW    APPearance CLEAR  CLEAR    Specific Gravity, Urine 1.020  1.005 - 1.030    pH 6.0  5.0 - 8.0    Glucose, UA NEGATIVE  NEGATIVE mg/dL    Hgb urine dipstick NEGATIVE  NEGATIVE    Bilirubin Urine NEGATIVE  NEGATIVE    Ketones, ur TRACE (*) NEGATIVE mg/dL    Protein, ur NEGATIVE  NEGATIVE mg/dL    Urobilinogen, UA 4.0 (*) 0.0 - 1.0 mg/dL    Nitrite NEGATIVE  NEGATIVE    Leukocytes, UA NEGATIVE  NEGATIVE MICROSCOPIC NOT DONE ON URINES WITH NEGATIVE PROTEIN, BLOOD, LEUKOCYTES, NITRITE, OR GLUCOSE <1000 mg/dL.   Dg Chest Portable 1 View  09/07/2011  *RADIOLOGY REPORT*  Clinical Data: Fever.  Hypertension and stroke.  PORTABLE CHEST - 1 VIEW  Comparison: 08/31/2008  Findings: The chin overlies the apices. Normal heart size.  Lung volumes are low. No pleural effusion or pneumothorax.  Clear left lung.  Subtle opacity in the right mid lung laterally.  IMPRESSION: Subtle right upper lobe opacity.   This is superimposed upon low lung volumes on this AP portable film. Most likely an area of atelectasis or infection.  Consider short-term radiographic follow- up versus further characterization with PA and lateral radiographs. Also recommend plain film follow-up to resolution to exclude underlying nodule.  Original Report Authenticated By: Consuello Bossier, M.D.      Assessment Present on Admission:  .Pneumonia, organism unspecified .Multiple sclerosis .Seizures .Hemiparesis, left .Hypertension .Hypothyroid .Depression .Aphasia .Obesity .Muscle weakness   PLAN:  We'll admit this lady for treatment of healthcare associated pneumonia; Continue most of her chronic medications except will temporarily  withhold the more sedating medications; she appears to be on multiple benzodiazepines.  Patient may need further reassessment for aspiration  Other plans as per orders.   Caydyn Sprung 09/08/2011, 3:03 AM

## 2011-09-08 NOTE — Care Management Note (Unsigned)
    Page 1 of 1   09/08/2011     2:05:09 PM   CARE MANAGEMENT NOTE 09/08/2011  Patient:  [name], [name]   Account Number:  [id number]  Date Initiated:  09/08/2011  Documentation initiated by:  Sharrie Rothman  Subjective/Objective Assessment:   Pt admitted from [facility] with pneumonia. Pt will return at discharge.     Action/Plan:   CSW is aware of pts admission and will arrange discharge to facility when medically stable.   Anticipated DC Date:  09/11/2011   Anticipated DC Plan:  SKILLED NURSING FACILITY  In-house referral  Clinical Social Worker      DC Planning Services  CM consult      Choice offered to / List presented to:             Status of service:  Completed, signed off Medicare Important Message given?   (If response is "NO", the following Medicare IM given date fields will be blank) Date Medicare IM given:   Date Additional Medicare IM given:    Discharge Disposition:    Per UR Regulation:    If discussed at Long Length of Stay Meetings, dates discussed:    Comments:  09/08/11 1404 Arlyss Queen, RN BSN CM

## 2011-09-09 ENCOUNTER — Inpatient Hospital Stay (HOSPITAL_COMMUNITY): Payer: Medicare Other

## 2011-09-09 DIAGNOSIS — G35 Multiple sclerosis: Secondary | ICD-10-CM

## 2011-09-09 DIAGNOSIS — J189 Pneumonia, unspecified organism: Secondary | ICD-10-CM

## 2011-09-09 DIAGNOSIS — E032 Hypothyroidism due to medicaments and other exogenous substances: Secondary | ICD-10-CM

## 2011-09-09 DIAGNOSIS — I10 Essential (primary) hypertension: Secondary | ICD-10-CM

## 2011-09-09 LAB — CBC
MCH: 30.3 pg (ref 26.0–34.0)
Platelets: 94 10*3/uL — ABNORMAL LOW (ref 150–400)
RBC: 3.56 MIL/uL — ABNORMAL LOW (ref 3.87–5.11)

## 2011-09-09 LAB — COMPREHENSIVE METABOLIC PANEL
AST: 26 U/L (ref 0–37)
Albumin: 2.6 g/dL — ABNORMAL LOW (ref 3.5–5.2)
Calcium: 9.2 mg/dL (ref 8.4–10.5)
Chloride: 106 mEq/L (ref 96–112)
Creatinine, Ser: 0.77 mg/dL (ref 0.50–1.10)

## 2011-09-09 MED ORDER — SODIUM CHLORIDE 0.9 % IJ SOLN
INTRAMUSCULAR | Status: AC
  Filled 2011-09-09: qty 3

## 2011-09-09 MED ORDER — LEVOFLOXACIN 500 MG PO TABS
750.0000 mg | ORAL_TABLET | ORAL | Status: DC
  Administered 2011-09-09: 750 mg via ORAL
  Filled 2011-09-09: qty 1

## 2011-09-09 NOTE — Progress Notes (Signed)
Subjective: This lady seems to be improving with a chest x-ray this morning that shows some improvement already. She is somewhat sleepy this morning.           Physical Exam: Blood pressure 96/67, pulse 62, temperature 97.6 F (36.4 C), temperature source Axillary, resp. rate 16, height 5\' 3"  (1.6 m), weight 70.5 kg (155 lb 6.8 oz), SpO2 100.00%. She does not have increased work of breathing. Her peripheral or warm. Heart sounds are present and normal. Lung fields anteriorly appear to be clear. She is sleepy but looks stable.   Investigations:  Recent Results (from the past 240 hour(s))  CULTURE, BLOOD (ROUTINE X 2)     Status: Normal (Preliminary result)   Collection Time   09/08/11  5:55 AM      Component Value Range Status Comment   Specimen Description Blood RIGHT ARM   Final    Special Requests NONE 6CC   Final    Culture NO GROWTH 1 DAY   Final    Report Status PENDING   Incomplete   CULTURE, BLOOD (ROUTINE X 2)     Status: Normal (Preliminary result)   Collection Time   09/08/11  5:56 AM      Component Value Range Status Comment   Specimen Description Blood LEFT ARM   Final    Special Requests NONE 6CC   Final    Culture NO GROWTH 1 DAY   Final    Report Status PENDING   Incomplete   MRSA PCR SCREENING     Status: Normal   Collection Time   09/08/11  7:55 AM      Component Value Range Status Comment   MRSA by PCR NEGATIVE  NEGATIVE Final      Basic Metabolic Panel:  Basename 09/09/11 0512 09/08/11 0555 09/07/11 2330  NA 142 -- 141  K 3.9 -- 3.9  CL 106 -- 104  CO2 29 -- 28  GLUCOSE 83 -- 128*  BUN 8 -- 10  CREATININE 0.77 0.68 --  CALCIUM 9.2 -- 9.7  MG -- -- --  PHOS -- -- --   Liver Function Tests:  [facility] 09/09/11 0512 09/07/11 2330  AST 26 38*  ALT 15 22  ALKPHOS 45 52  BILITOT 0.2* 0.2*  PROT 6.3 6.7  ALBUMIN 2.6* 2.9*     CBC:  Basename 09/09/11 0512 09/08/11 0555  WBC 6.5 8.6  NEUTROABS -- --  HGB 10.8* 11.4*  HCT 33.5* 35.2*    MCV 94.1 93.4  PLT 94* 91*    Dg Chest 1 View  09/09/2011  *RADIOLOGY REPORT*  Clinical Data: Follow up pneumonia  CHEST - 1 VIEW  Comparison: 09/07/2011 next the  Findings: Heart size is normal.  No pleural effusion or edema.  Airspace opacity within the right upper lobe is improved from previous exam.  There is mild plate-like atelectasis noted in the left base.  IMPRESSION:  1.Interval improvement in right apical opacity.  Original Report Authenticated By: Rosealee Albee, M.D.   Dg Chest Portable 1 View  09/07/2011  *RADIOLOGY REPORT*  Clinical Data: Fever.  Hypertension and stroke.  PORTABLE CHEST - 1 VIEW  Comparison: 08/31/2008  Findings: The chin overlies the apices. Normal heart size.  Lung volumes are low. No pleural effusion or pneumothorax.  Clear left lung.  Subtle opacity in the right mid lung laterally.  IMPRESSION: Subtle right upper lobe opacity.   This is superimposed upon low lung volumes on this AP portable film.  Most likely an area of atelectasis or infection.  Consider short-term radiographic follow- up versus further characterization with PA and lateral radiographs. Also recommend plain film follow-up to resolution to exclude underlying nodule.  Original Report Authenticated By: Consuello Bossier, M.D.      Medications:  Scheduled:   . aspirin  81 mg Oral Daily  . ceFEPime (MAXIPIME) IV  1 g Intravenous Q12H  . divalproex  500 mg Oral TID  . famotidine  20 mg Oral BID  . interferon beta-1a  30 mcg Intramuscular Q7 days  . levETIRAcetam  2,000 mg Oral BID  . levofloxacin (LEVAQUIN) IV  750 mg Intravenous Q24H  . levothyroxine  112 mcg Oral Daily  . oxyCODONE  10 mg Oral Q8H  . polyethylene glycol  17 g Oral Daily  . sodium chloride      . tiZANidine  2 mg Oral QHS  . DISCONTD: aspirin  162 mg Oral Daily  . DISCONTD: enoxaparin  40 mg Subcutaneous Q24H  . DISCONTD: tiZANidine  1 mg Oral 1 day or 1 dose  . DISCONTD: vancomycin  750 mg Intravenous Q12H     Impression: 1. Healthcare associated pneumonia, radiologically improving. 2. Multiple sclerosis, bedbound state. 3. Cerebrovascular disease with previous history of CVA. 4. Hypertension. 5. Hypothyroidism. 6. History of seizures, clinically stable.     Plan: 1. Continue with antibiotics. 2. Disposition-skilled nursing facility at [facility] when medically stable.     LOS: 2 days   Wilson Singer Pager [phone]  09/09/2011, 9:51 AM

## 2011-09-10 DIAGNOSIS — J189 Pneumonia, unspecified organism: Secondary | ICD-10-CM

## 2011-09-10 DIAGNOSIS — I10 Essential (primary) hypertension: Secondary | ICD-10-CM

## 2011-09-10 DIAGNOSIS — G35 Multiple sclerosis: Secondary | ICD-10-CM

## 2011-09-10 DIAGNOSIS — E032 Hypothyroidism due to medicaments and other exogenous substances: Secondary | ICD-10-CM

## 2011-09-10 LAB — CBC
HCT: 33 % — ABNORMAL LOW (ref 36.0–46.0)
Hemoglobin: 10.7 g/dL — ABNORMAL LOW (ref 12.0–15.0)
MCHC: 32.4 g/dL (ref 30.0–36.0)
RBC: 3.57 MIL/uL — ABNORMAL LOW (ref 3.87–5.11)
WBC: 5.4 10*3/uL (ref 4.0–10.5)

## 2011-09-10 LAB — LEGIONELLA ANTIGEN, URINE: Legionella Antigen, Urine: NEGATIVE

## 2011-09-10 LAB — BASIC METABOLIC PANEL
BUN: 6 mg/dL (ref 6–23)
Chloride: 104 mEq/L (ref 96–112)
GFR calc non Af Amer: >90 mL/min (ref >90)
Glucose, Bld: 89 mg/dL (ref 70–99)
Potassium: 3.9 mEq/L (ref 3.5–5.1)
Sodium: 139 mEq/L (ref 135–145)

## 2011-09-10 MED ORDER — LEVOFLOXACIN 750 MG PO TABS: 750.0000 mg | ORAL_TABLET | ORAL | Status: AC

## 2011-09-10 MED ORDER — OXYCODONE HCL 10 MG PO TABS: 10.0000 mg | ORAL_TABLET | Freq: Three times a day (TID) | ORAL | Status: DC | PRN

## 2011-09-10 NOTE — Progress Notes (Signed)
CSW assisting with d/c planning. Pt d/c back to [facility] this am via EMS. Pt's sister contacted and informed of D/C back to SNF.   Cori Razor LCSW [phone]

## 2011-09-10 NOTE — Progress Notes (Signed)
Subjective: This lady is clearly improved and much more alert today. She seems to be taking good by mouth intake this morning.          Physical Exam: Blood pressure 121/78, pulse 73, temperature 99.1 F (37.3 C), temperature source Oral, resp. rate 18, height 5\' 3"  (1.6 m), weight 70.2 kg (154 lb 12.2 oz), SpO2 97.00%. She does not have increased work of breathing. Her peripheral or warm. Heart sounds are present and normal. Lung fields anteriorly appear to be clear. She is alert. I think she is at her baseline in terms of mentation and communication.   Investigations:  Recent Results (from the past 240 hour(s))  CULTURE, BLOOD (ROUTINE X 2)     Status: Normal (Preliminary result)   Collection Time   09/08/11  5:55 AM      Component Value Range Status Comment   Specimen Description Blood RIGHT ARM   Final    Special Requests NONE 6CC   Final    Culture NO GROWTH 2 DAYS   Final    Report Status PENDING   Incomplete   CULTURE, BLOOD (ROUTINE X 2)     Status: Normal (Preliminary result)   Collection Time   09/08/11  5:56 AM      Component Value Range Status Comment   Specimen Description Blood LEFT ARM   Final    Special Requests NONE 6CC   Final    Culture NO GROWTH 2 DAYS   Final    Report Status PENDING   Incomplete   MRSA PCR SCREENING     Status: Normal   Collection Time   09/08/11  7:55 AM      Component Value Range Status Comment   MRSA by PCR NEGATIVE  NEGATIVE Final      Basic Metabolic Panel:  Basename 09/10/11 0458 09/09/11 0512  NA 139 142  K 3.9 3.9  CL 104 106  CO2 28 29  GLUCOSE 89 83  BUN 6 8  CREATININE 0.69 0.77  CALCIUM 9.4 9.2  MG -- --  PHOS -- --   Liver Function Tests:  [facility] 09/09/11 0512 09/07/11 2330  AST 26 38*  ALT 15 22  ALKPHOS 45 52  BILITOT 0.2* 0.2*  PROT 6.3 6.7  ALBUMIN 2.6* 2.9*     CBC:  Basename 09/10/11 0458 09/09/11 0512  WBC 5.4 6.5  NEUTROABS -- --  HGB 10.7* 10.8*  HCT 33.0* 33.5*  MCV 92.4 94.1  PLT  92* 94*    Dg Chest 1 View  09/09/2011  *RADIOLOGY REPORT*  Clinical Data: Follow up pneumonia  CHEST - 1 VIEW  Comparison: 09/07/2011 next the  Findings: Heart size is normal.  No pleural effusion or edema.  Airspace opacity within the right upper lobe is improved from previous exam.  There is mild plate-like atelectasis noted in the left base.  IMPRESSION:  1.Interval improvement in right apical opacity.  Original Report Authenticated By: Rosealee Albee, M.D.         Impression: 1. Healthcare associated pneumonia, radiologically improving. 2. Multiple sclerosis, bedbound state. 3. Cerebrovascular disease with previous history of CVA. 4. Hypertension. 5. Hypothyroidism. 6. History of seizures, clinically stable.     Plan: 1. Continue with oral antibiotics, which were instituted yesterday. 2. Patient is medically stable for discharge to the skilled nursing facility but today is Sunday and I'm not sure whether this will happen today. Certainly, she can be discharged tomorrow.     LOS: 3  days   Wilson Singer Pager [phone]  09/10/2011, 8:22 AM

## 2011-09-10 NOTE — Plan of Care (Signed)
Problem: Discharge Progression Outcomes Goal: Other Discharge Outcomes/Goals Report called to [facility] [employer]  All questions answered Pt transferred via EMS to [facility]

## 2011-09-10 NOTE — Discharge Summary (Signed)
Physician Discharge Summary  Patient ID: [name] MRN: [id number] DOB/AGE: 1951-01-31 61 y.o.  Admit date: 09/07/2011 Discharge date: 09/10/2011    Discharge Diagnoses:  1. Healthcare associated pneumonia, clinically and radiologically improved. 2. Multiple sclerosis, bedbound state. 3. Cerebrovascular disease with previous history of CVA. A phasic. 4. Hypertension. 5. Hypothyroidism. 6. History of seizures, clinically stable.   Medication List  As of 09/10/2011  8:47 AM   TAKE these medications         acetaminophen 650 MG suppository   Commonly known as: TYLENOL   Place 650 mg rectally 1 day or 1 dose. For increased fever      aspirin 81 MG chewable tablet   Chew 81 mg by mouth daily.      clonazePAM 0.5 MG tablet   Commonly known as: KLONOPIN   Take 0.5 mg by mouth 2 (two) times daily as needed. anxiety      diazepam 5 MG tablet   Commonly known as: VALIUM   Take 5 mg by mouth 2 (two) times daily as needed. Muscle spasm      divalproex 125 MG capsule   Commonly known as: DEPAKOTE SPRINKLE   Take 500 mg by mouth 3 (three) times daily.      interferon beta-1a 30 MCG/0.5ML injection   Commonly known as: AVONEX   Inject 30 mcg into the muscle every 7 (seven) days. On Tuesday      levETIRAcetam 1000 MG tablet   Commonly known as: KEPPRA   Take 2,000 mg by mouth 2 (two) times daily.      levofloxacin 750 MG tablet   Commonly known as: LEVAQUIN   Take 1 tablet (750 mg total) by mouth daily.      levothyroxine 112 MCG tablet   Commonly known as: SYNTHROID, LEVOTHROID   Take 112 mcg by mouth daily.      LORazepam 0.5 MG tablet   Commonly known as: ATIVAN   Take 0.5 mg by mouth at bedtime.      LORazepam 2 MG/ML concentrated solution   Commonly known as: ATIVAN   Inject 2 mg into the muscle every 2 (two) hours as needed. Sign or symptoms of seizure activity      multivitamin with minerals tablet   Take 1 tablet by mouth daily.      Oxycodone HCl 10 MG  Tabs   Take 1 tablet (10 mg total) by mouth every 8 (eight) hours as needed. pain      PATADAY 0.2 % Soln   Generic drug: Olopatadine HCl   Place 1 drop into both eyes as needed. itching      polyethylene glycol powder powder   Commonly known as: GLYCOLAX/MIRALAX   Take 17 g by mouth 2 (two) times daily.      ranitidine 150 MG tablet   Commonly known as: ZANTAC   Take 150 mg by mouth at bedtime.      tiZANidine 2 MG tablet   Commonly known as: ZANAFLEX   Take 2 mg by mouth at bedtime.            Discharged Condition: Stable and improved.    Consults: None.  Significant Diagnostic Studies: Dg Chest 1 View  09/09/2011  *RADIOLOGY REPORT*  Clinical Data: Follow up pneumonia  CHEST - 1 VIEW  Comparison: 09/07/2011 next the  Findings: Heart size is normal.  No pleural effusion or edema.  Airspace opacity within the right upper lobe is improved from previous exam.  There is mild plate-like atelectasis noted in the left base.  IMPRESSION:  1.Interval improvement in right apical opacity.  Original Report Authenticated By: Rosealee Albee, M.D.   Dg Chest Portable 1 View  09/07/2011  *RADIOLOGY REPORT*  Clinical Data: Fever.  Hypertension and stroke.  PORTABLE CHEST - 1 VIEW  Comparison: 08/31/2008  Findings: The chin overlies the apices. Normal heart size.  Lung volumes are low. No pleural effusion or pneumothorax.  Clear left lung.  Subtle opacity in the right mid lung laterally.  IMPRESSION: Subtle right upper lobe opacity.   This is superimposed upon low lung volumes on this AP portable film. Most likely an area of atelectasis or infection.  Consider short-term radiographic follow- up versus further characterization with PA and lateral radiographs. Also recommend plain film follow-up to resolution to exclude underlying nodule.  Original Report Authenticated By: Consuello Bossier, M.D.    Lab Results: Basic Metabolic Panel:  Basename 09/10/11 0458 09/09/11 0512  NA 139 142  K 3.9  3.9  CL 104 106  CO2 28 29  GLUCOSE 89 83  BUN 6 8  CREATININE 0.69 0.77  CALCIUM 9.4 9.2  MG -- --  PHOS -- --   Liver Function Tests:  [facility] 09/09/11 0512 09/07/11 2330  AST 26 38*  ALT 15 22  ALKPHOS 45 52  BILITOT 0.2* 0.2*  PROT 6.3 6.7  ALBUMIN 2.6* 2.9*     CBC:  Basename 09/10/11 0458 09/09/11 0512  WBC 5.4 6.5  NEUTROABS -- --  HGB 10.7* 10.8*  HCT 33.0* 33.5*  MCV 92.4 94.1  PLT 92* 94*    Recent Results (from the past 240 hour(s))  CULTURE, BLOOD (ROUTINE X 2)     Status: Normal (Preliminary result)   Collection Time   09/08/11  5:55 AM      Component Value Range Status Comment   Specimen Description Blood RIGHT ARM   Final    Special Requests NONE 6CC   Final    Culture NO GROWTH 2 DAYS   Final    Report Status PENDING   Incomplete   CULTURE, BLOOD (ROUTINE X 2)     Status: Normal (Preliminary result)   Collection Time   09/08/11  5:56 AM      Component Value Range Status Comment   Specimen Description Blood LEFT ARM   Final    Special Requests NONE 6CC   Final    Culture NO GROWTH 2 DAYS   Final    Report Status PENDING   Incomplete   MRSA PCR SCREENING     Status: Normal   Collection Time   09/08/11  7:55 AM      Component Value Range Status Comment   MRSA by PCR NEGATIVE  NEGATIVE Final      Hospital Course: This very pleasant 61 year old lady who has end-stage multiple sclerosis and is bedbound and also has a history of CVA, presented to the hospital with symptoms of fever. She does also have a history of recurrent aspiration. Chest x-ray suggested pneumonia in the right upper lobe. She was treated appropriately with intravenous antibiotics for healthcare associated pneumonia. She did so well that her antibiotics were modified and reduced to oral. She looks much improved today, no increased work of breathing and much more alert. She is stable for discharge.  Discharge Exam: Blood pressure 121/78, pulse 73, temperature 99.1 F (37.3 C),  temperature source Oral, resp. rate 18, height 5\' 3"  (1.6 m), weight 70.2  kg (154 lb 12.2 oz), SpO2 97.00%. She looks systemically well. Heart sounds are present and normal. Lung fields are clinically clear anteriorly. She is alert.  Disposition: Skilled nursing facility at [facility] where she lives. She will need a further one week course of oral antibiotics with Levaquin.  Discharge Orders    Future Orders Please Complete By Expires   Diet - low sodium heart healthy      Increase activity slowly           SignedWilson Singer Pager [phone]  09/10/2011, 8:47 AM

## 2011-09-13 LAB — CULTURE, BLOOD (ROUTINE X 2): Culture: NO GROWTH

## 2011-11-29 ENCOUNTER — Encounter (HOSPITAL_COMMUNITY): Payer: Self-pay | Admitting: *Deleted

## 2011-11-29 ENCOUNTER — Inpatient Hospital Stay (HOSPITAL_COMMUNITY)
Admission: EM | Admit: 2011-11-29 | Discharge: 2011-12-02 | DRG: 071 | Disposition: A | Discharge status: Skilled Nursing Facility | Payer: Medicare Other | Attending: Internal Medicine | Admitting: Internal Medicine

## 2011-11-29 DIAGNOSIS — T438X5A Adverse effect of other psychotropic drugs, initial encounter: Secondary | ICD-10-CM | POA: Diagnosis present

## 2011-11-29 DIAGNOSIS — D696 Thrombocytopenia, unspecified: Secondary | ICD-10-CM

## 2011-11-29 DIAGNOSIS — F32A Depression, unspecified: Secondary | ICD-10-CM

## 2011-11-29 DIAGNOSIS — F3289 Other specified depressive episodes: Secondary | ICD-10-CM | POA: Diagnosis present

## 2011-11-29 DIAGNOSIS — R4189 Other symptoms and signs involving cognitive functions and awareness: Secondary | ICD-10-CM

## 2011-11-29 DIAGNOSIS — R5383 Other fatigue: Secondary | ICD-10-CM

## 2011-11-29 DIAGNOSIS — G35D Multiple sclerosis, unspecified: Secondary | ICD-10-CM

## 2011-11-29 DIAGNOSIS — R1310 Dysphagia, unspecified: Secondary | ICD-10-CM

## 2011-11-29 DIAGNOSIS — R4701 Aphasia: Secondary | ICD-10-CM

## 2011-11-29 DIAGNOSIS — G8190 Hemiplegia, unspecified affecting unspecified side: Secondary | ICD-10-CM | POA: Diagnosis present

## 2011-11-29 DIAGNOSIS — G40909 Epilepsy, unspecified, not intractable, without status epilepticus: Secondary | ICD-10-CM | POA: Diagnosis present

## 2011-11-29 DIAGNOSIS — E669 Obesity, unspecified: Secondary | ICD-10-CM

## 2011-11-29 DIAGNOSIS — I498 Other specified cardiac arrhythmias: Secondary | ICD-10-CM | POA: Diagnosis present

## 2011-11-29 DIAGNOSIS — R404 Transient alteration of awareness: Secondary | ICD-10-CM | POA: Diagnosis present

## 2011-11-29 DIAGNOSIS — R1319 Other dysphagia: Secondary | ICD-10-CM | POA: Diagnosis present

## 2011-11-29 DIAGNOSIS — I10 Essential (primary) hypertension: Secondary | ICD-10-CM

## 2011-11-29 DIAGNOSIS — G9340 Encephalopathy, unspecified: Principal | ICD-10-CM | POA: Diagnosis present

## 2011-11-29 DIAGNOSIS — R001 Bradycardia, unspecified: Secondary | ICD-10-CM

## 2011-11-29 DIAGNOSIS — R4182 Altered mental status, unspecified: Secondary | ICD-10-CM

## 2011-11-29 DIAGNOSIS — Z7982 Long term (current) use of aspirin: Secondary | ICD-10-CM

## 2011-11-29 DIAGNOSIS — G8194 Hemiplegia, unspecified affecting left nondominant side: Secondary | ICD-10-CM

## 2011-11-29 DIAGNOSIS — M6281 Muscle weakness (generalized): Secondary | ICD-10-CM

## 2011-11-29 DIAGNOSIS — G35 Multiple sclerosis: Secondary | ICD-10-CM | POA: Diagnosis present

## 2011-11-29 DIAGNOSIS — G894 Chronic pain syndrome: Secondary | ICD-10-CM

## 2011-11-29 DIAGNOSIS — F05 Delirium due to known physiological condition: Secondary | ICD-10-CM | POA: Diagnosis present

## 2011-11-29 DIAGNOSIS — R569 Unspecified convulsions: Secondary | ICD-10-CM

## 2011-11-29 DIAGNOSIS — E039 Hypothyroidism, unspecified: Secondary | ICD-10-CM

## 2011-11-29 DIAGNOSIS — F329 Major depressive disorder, single episode, unspecified: Secondary | ICD-10-CM

## 2011-11-29 DIAGNOSIS — Z7401 Bed confinement status: Secondary | ICD-10-CM

## 2011-11-29 HISTORY — DX: Dysphagia, unspecified: R13.10

## 2011-11-29 HISTORY — DX: Chronic pain syndrome: G89.4

## 2011-11-29 NOTE — ED Notes (Addendum)
Pt brought to er by [facility] with c/o altered loc, according to EMS staff noticed that pt was not responding well to staff at around 09:50 tonight, pt was given 1 chest compression by nursing home staff and oxygen and reported to ems that pt sat up in bed and told them to stop. ems reports that they were only able to get pt to respond to painful stimuli.  pt was given 2 mg narcan by ems with no change in pt loc, bp with ems was 62/40, blood sugar 119. Pt given fluid bolus of 250cc by ems while enroute to er.  Nursing home reported to ems that pt had been given 10mg  of oxycodone IR PO  and 2mg  of ativan IM within the past few hours by them

## 2011-11-29 NOTE — ED Notes (Signed)
Respirations even and unlabored.  Patient lying on stretcher with eyes closed. Patient withdraws to painful stimuli, does not follow commands.

## 2011-11-30 ENCOUNTER — Inpatient Hospital Stay (HOSPITAL_COMMUNITY)
Admit: 2011-11-30 | Discharge: 2011-11-30 | Disposition: A | Discharge status: Home / Self Care | Payer: Medicare Other | Attending: Internal Medicine | Admitting: Internal Medicine

## 2011-11-30 ENCOUNTER — Emergency Department (HOSPITAL_COMMUNITY): Payer: Medicare Other

## 2011-11-30 ENCOUNTER — Encounter (HOSPITAL_COMMUNITY): Payer: Self-pay | Admitting: *Deleted

## 2011-11-30 DIAGNOSIS — G35 Multiple sclerosis: Secondary | ICD-10-CM

## 2011-11-30 DIAGNOSIS — R4189 Other symptoms and signs involving cognitive functions and awareness: Secondary | ICD-10-CM | POA: Diagnosis present

## 2011-11-30 DIAGNOSIS — R4182 Altered mental status, unspecified: Secondary | ICD-10-CM | POA: Diagnosis present

## 2011-11-30 DIAGNOSIS — E039 Hypothyroidism, unspecified: Secondary | ICD-10-CM

## 2011-11-30 DIAGNOSIS — R404 Transient alteration of awareness: Secondary | ICD-10-CM

## 2011-11-30 DIAGNOSIS — R5383 Other fatigue: Secondary | ICD-10-CM | POA: Diagnosis present

## 2011-11-30 LAB — MRSA PCR SCREENING: MRSA by PCR: NEGATIVE

## 2011-11-30 LAB — URINALYSIS, ROUTINE W REFLEX MICROSCOPIC
Leukocytes, UA: NEGATIVE
Protein, ur: NEGATIVE mg/dL
Urobilinogen, UA: 1 mg/dL (ref 0.0–1.0)

## 2011-11-30 LAB — CBC WITH DIFFERENTIAL/PLATELET
Basophils Relative: 0 % (ref 0–1)
Eosinophils Relative: 3 % (ref 0–5)
HCT: 43.4 % (ref 36.0–46.0)
Hemoglobin: 14.1 g/dL (ref 12.0–15.0)
Lymphocytes Relative: 38 % (ref 12–46)
MCH: 31.2 pg (ref 26.0–34.0)
MCHC: 32.5 g/dL (ref 30.0–36.0)
Neutro Abs: 2.3 10*3/uL (ref 1.7–7.7)
Neutrophils Relative %: 47 % (ref 43–77)
RBC: 4.52 MIL/uL (ref 3.87–5.11)

## 2011-11-30 LAB — TSH: TSH: 0.437 u[IU]/mL (ref 0.350–4.500)

## 2011-11-30 LAB — HEPATIC FUNCTION PANEL
Bilirubin, Direct: 0.1 mg/dL (ref 0.0–0.3)
Total Bilirubin: 0.3 mg/dL (ref 0.3–1.2)

## 2011-11-30 LAB — BASIC METABOLIC PANEL
BUN: 9 mg/dL (ref 6–23)
CO2: 22 mEq/L (ref 19–32)
Glucose, Bld: 109 mg/dL — ABNORMAL HIGH (ref 70–99)
Potassium: 4 mEq/L (ref 3.5–5.1)
Sodium: 138 mEq/L (ref 135–145)

## 2011-11-30 LAB — URINE CULTURE

## 2011-11-30 LAB — URINE MICROSCOPIC-ADD ON

## 2011-11-30 MED ORDER — BIOTENE DRY MOUTH MT LIQD
15.0000 mL | Freq: Two times a day (BID) | OROMUCOSAL | Status: DC
  Administered 2011-12-01 – 2011-12-02 (×3): 15 mL via OROMUCOSAL

## 2011-11-30 MED ORDER — PANTOPRAZOLE SODIUM 40 MG IV SOLR
40.0000 mg | INTRAVENOUS | Status: DC
  Administered 2011-11-30: 40 mg via INTRAVENOUS
  Filled 2011-11-30: qty 40

## 2011-11-30 MED ORDER — SODIUM CHLORIDE 0.9 % IV SOLN
1500.0000 mg | Freq: Two times a day (BID) | INTRAVENOUS | Status: DC
  Administered 2011-11-30 (×2): 1500 mg via INTRAVENOUS
  Filled 2011-11-30 (×3): qty 15

## 2011-11-30 MED ORDER — LEVOTHYROXINE SODIUM 100 MCG IV SOLR
56.0000 ug | Freq: Every day | INTRAVENOUS | Status: DC
  Administered 2011-11-30: 56 ug via INTRAVENOUS
  Filled 2011-11-30 (×2): qty 2.8

## 2011-11-30 MED ORDER — ONDANSETRON HCL 4 MG/2ML IJ SOLN: 4.0000 mg | Freq: Four times a day (QID) | INTRAMUSCULAR | Status: DC | PRN

## 2011-11-30 MED ORDER — SODIUM CHLORIDE 0.9 % IV SOLN
INTRAVENOUS | Status: DC
  Administered 2011-11-30: 03:00:00 via INTRAVENOUS

## 2011-11-30 MED ORDER — SODIUM CHLORIDE 0.9 % IJ SOLN
INTRAMUSCULAR | Status: AC
  Filled 2011-11-30: qty 3

## 2011-11-30 MED ORDER — FLEET ENEMA 7-19 GM/118ML RE ENEM: 1.0000 | ENEMA | Freq: Once | RECTAL | Status: AC | PRN

## 2011-11-30 MED ORDER — POTASSIUM CHLORIDE IN NACL 20-0.9 MEQ/L-% IV SOLN
INTRAVENOUS | Status: DC
  Administered 2011-11-30 – 2011-12-02 (×4): via INTRAVENOUS

## 2011-11-30 MED ORDER — SODIUM CHLORIDE 0.9 % IV SOLN: INTRAVENOUS | Status: DC

## 2011-11-30 MED ORDER — CHLORHEXIDINE GLUCONATE 0.12 % MT SOLN
15.0000 mL | Freq: Two times a day (BID) | OROMUCOSAL | Status: DC
  Administered 2011-12-01 – 2011-12-02 (×3): 15 mL via OROMUCOSAL
  Filled 2011-11-30 (×4): qty 15

## 2011-11-30 MED ORDER — POTASSIUM CHLORIDE IN NACL 20-0.9 MEQ/L-% IV SOLN
INTRAVENOUS | Status: DC
  Administered 2011-11-30: 08:00:00 via INTRAVENOUS

## 2011-11-30 NOTE — Clinical Social Work Note (Signed)
Call from Lenna Sciara, Medicaid Supervisor for [employer].  States patient is already approved for long term care Medicaid, so no further approvals are required when patient discharges to [facility] when ready.  Santa Genera, LCSW Clinical Social Worker [phone])

## 2011-11-30 NOTE — ED Notes (Signed)
Urine collected via I&O cath; specimen sent to lab.

## 2011-11-30 NOTE — ED Notes (Signed)
Patient incontinent of urine.  Patient cleaned and linen changed.  Patient awaiting for bed assignment.  Dr. Orvan Falconer has been to ER to assess for admission.

## 2011-11-30 NOTE — ED Notes (Signed)
Patient resting quietly in bed with eyes closed.  Patient awaiting disposition.

## 2011-11-30 NOTE — ED Notes (Signed)
Patient lying in bed with eyes closed.  O2 sats 98% on room air.  NRB mask removed.

## 2011-11-30 NOTE — Clinical Social Work Psychosocial (Signed)
    Clinical Social Work Department BRIEF PSYCHOSOCIAL ASSESSMENT 11/30/2011  Patient:  [name], [name]     Account Number:  [id number]     Admit date:  11/29/2011  Clinical Social Worker:  Santa Genera, CLINICAL SOCIAL WORKER  Date/Time:  11/30/2011 12:00 N  Referred by:  RN  Date Referred:  11/30/2011 Referred for  SNF Placement   Other Referral:   Interview type:  Other - See comment Other interview type:   Brandi, Admissions at [facility]    PSYCHOSOCIAL DATA Living Status:  FACILITY Admitted from facility:  [facility] Level of care:  Skilled Nursing Facility Primary support name:  Rosaleigh Brazzel Primary support relationship to patient:  SIBLING Degree of support available:   Per facility, [facility] makes all decisions and is reponsible party for decision making for patient.    CURRENT CONCERNS Current Concerns  Post-Acute Placement   Other Concerns:    SOCIAL WORK ASSESSMENT / PLAN CSW attempted to meet w patient at bedside.  Patient not able to participate.  Left VM for sister, [name].  Spoke w Merry Proud, Admissions at [facility] who confirmed that patient had been resident of [facility] since 04-22-2008. States that patient is "total care", unable to ambulate, transfer, dress, toilet or eat by herself.  Sister is listed as responsible party for patient and makes all decisions.  Facility is willing to take patient back at discharge.  Patient is not receiving any PT, OT or speech therapy services at this time.  At baseline, she is aphasic, "smiles" at times, but is not able to communicate otherwise.  Contacted Medicaid supervisor at [employer] to discuss need for Medicaid prior approval for return to facility at discharge.   Assessment/plan status:  Psychosocial Support/Ongoing Assessment of Needs Other assessment/ plan:   Information/referral to community resources:   None needed at this time.    PATIENT'S/FAMILY'S  RESPONSE TO PLAN OF CARE: Unable to assess.   Santa Genera, LCSW Clinical Social Worker [phone])

## 2011-11-30 NOTE — Progress Notes (Signed)
EEG COMPLETED

## 2011-11-30 NOTE — H&P (Signed)
[facility] History and Physical  [name]  RUE:[id number]  DOB: 01-28-1951   DOA: 11/30/2011   PCP:    [name] M.D.   Chief Complaint:  Unresponsiveness since yesterday  HPI: [name] is an 61 y.o. female.  Obese, nonverbal, African American lady, bedridden with multiple sclerosis, left hemiplegia, recurrent aspiration, sent to the emergency room because of unresponsiveness since yesterday. Patient is prescribed multiple prn sedating medications, including narcotics several different types of benzodiazepines and antiseizure medication. She received intramuscular Ativan, as well as oxycodone prior to arrival. She appears to be in a deep sleep did not respond to Narcan in the emergency room, and after being held for 7 hours in the emergency room the hospitalist service was called to assist management.  Patient responds only to deep painful stimuli by opening her eyes and raising her head, then falls back into a sleep. She otherwise does not show signs of distress. She is unable to contribute to the history  Rewiew of Systems:  Unable to obtain due to patient's mental status   Past Medical History  Diagnosis Date  . Multiple sclerosis   . Seizures   . Dysphagia   . Dehydration   . Hypertension   . Hemiparesis, left   . Pneumonia   . Thrombocytopenia   . Aphasia   . Depression   . Hypothyroid   . Esophageal reflux   . Stroke   . Lack of coordination   . Contracture of ankle and foot joint   . Obesity   . Muscle weakness     History reviewed. No pertinent past surgical history.  Medications:  HOME MEDS: Prior to Admission medications   Medication Sig Start Date End Date Taking? Authorizing Provider  acetaminophen (TYLENOL) 650 MG suppository Place 650 mg rectally 1 day or 1 dose. For increased fever    Historical Provider, MD  aspirin 81 MG chewable tablet Chew 81 mg by mouth daily.    Historical Provider, MD  clonazePAM (KLONOPIN) 0.5 MG tablet Take  0.5 mg by mouth 2 (two) times daily as needed. anxiety    Historical Provider, MD  diazepam (VALIUM) 5 MG tablet Take 5 mg by mouth 2 (two) times daily as needed. Muscle spasm    Historical Provider, MD  divalproex (DEPAKOTE SPRINKLE) 125 MG capsule Take 500 mg by mouth 3 (three) times daily.     Historical Provider, MD  interferon beta-1a (AVONEX) 30 MCG/0.5ML injection Inject 30 mcg into the muscle every 7 (seven) days. On Tuesday    Historical Provider, MD  levETIRAcetam (KEPPRA) 1000 MG tablet Take 2,000 mg by mouth 2 (two) times daily.     Historical Provider, MD  levothyroxine (SYNTHROID, LEVOTHROID) 112 MCG tablet Take 112 mcg by mouth daily.    Historical Provider, MD  LORazepam (ATIVAN) 0.5 MG tablet Take 0.5 mg by mouth at bedtime.    Historical Provider, MD  LORazepam (ATIVAN) 2 MG/ML concentrated solution Inject 2 mg into the muscle every 2 (two) hours as needed. Sign or symptoms of seizure activity    Historical Provider, MD  Multiple Vitamins-Minerals (MULTIVITAMIN WITH MINERALS) tablet Take 1 tablet by mouth daily.    Historical Provider, MD  Olopatadine HCl (PATADAY) 0.2 % SOLN Place 1 drop into both eyes as needed. itching    Historical Provider, MD  Oxycodone HCl 10 MG TABS Take 1 tablet (10 mg total) by mouth every 8 (eight) hours as needed. pain 09/10/11   [name],  MD  polyethylene glycol powder (GLYCOLAX/MIRALAX) powder Take 17 g by mouth 2 (two) times daily.    Historical Provider, MD  ranitidine (ZANTAC) 150 MG tablet Take 150 mg by mouth at bedtime.    Historical Provider, MD  tiZANidine (ZANAFLEX) 2 MG tablet Take 2 mg by mouth at bedtime.    Historical Provider, MD     Allergies:  No Known Allergies  Social History:   reports that she has never smoked. She does not have any smokeless tobacco history on file. She reports that she does not drink alcohol or use illicit drugs.  Family History: No family history on file.   Physical Exam: Filed Vitals:    11/29/11 2302 11/30/11 0015 11/30/11 0100 11/30/11 0307  BP: 142/87 133/80 125/68 123/84  Pulse: 75 66 78 72  Temp: 99.5 F (37.5 C)     TempSrc: Rectal     Resp: 18 18 23 18   SpO2: 100% 100% 99% 98%   Blood pressure 123/84, pulse 72, temperature 99.5 F (37.5 C), temperature source Rectal, resp. rate 18, SpO2 98.00%.  GEN: Deeply lethargic obese lady lying in the stretcher in no acute distress; unable to be unable to be cooperative with exam PSYCH:  alert and oriented x0;  HEENT: Mucous membranes pink and anicteric; pupils size about 1.5 mm PERRLA;; no cervical lymphadenopathy nor thyromegaly or carotid bruit; no JVD; Breasts:: Not examined CHEST WALL: No tenderness CHEST: Normal respiration, clear to auscultation bilaterally HEART: Regular rate and rhythm; no murmurs rubs or gallops BACK: No kyphosis or scoliosis; no CVA tenderness ABDOMEN: Obese, soft non-tender; no masses, no organomegaly, normal abdominal bowel sounds; large pannus; no intertriginous candida. Rectal Exam: Not done EXTREMITIES: flexion contracture of the ankles; no edema; no ulcerations. Genitalia: not examined PULSES: 2+ and symmetric SKIN: Normal hydration. Multiple punctate marks on the hands; possible attempted needle sticks, possible insect bites CNS:  Cranial nerves: left facial weakness; lateral lower extremity weakness unable to actively assess  Labs on Admission:  Basic Metabolic Panel:  Lab 11/30/11 1610  NA 138  K 4.0  CL 104  CO2 22  GLUCOSE 109*  BUN 9  CREATININE 0.59  CALCIUM 10.0  MG --  PHOS --   Liver Function Tests:  Lab 11/30/11 0048  AST 52*  ALT 18  ALKPHOS 57  BILITOT 0.3  PROT 7.9  ALBUMIN 3.0*    Lab 11/30/11 0048  LIPASE 17  AMYLASE --   No results found for this basename: AMMONIA:5 in the last 168 hours CBC:  Lab 11/30/11 0020  WBC 5.0  NEUTROABS 2.3  HGB 14.1  HCT 43.4  MCV 96.0  PLT HIDE   Cardiac Enzymes: No results found for this basename:  CKTOTAL:5,CKMB:5,CKMBINDEX:5,TROPONINI:5 in the last 168 hours BNP: No components found with this basename: POCBNP:5 D-dimer: No components found with this basename: D-DIMER:5 CBG: No results found for this basename: GLUCAP:5 in the last 168 hours  Results for orders placed during the hospital encounter of 11/29/11 (from the past 48 hour(s))  CBC WITH DIFFERENTIAL     Status: Normal   Collection Time   11/30/11 12:20 AM      Component Value Range Comment   WBC 5.0  4.0 - 10.5 K/uL    RBC 4.52  3.87 - 5.11 MIL/uL    Hemoglobin 14.1  12.0 - 15.0 g/dL    HCT 96.0  45.4 - 09.8 %    MCV 96.0  78.0 - 100.0 fL    MCH  31.2  26.0 - 34.0 pg    MCHC 32.5  30.0 - 36.0 g/dL    RDW 16.1  09.6 - 04.5 %    Platelets HIDE  150 - 400 K/uL    Neutrophils Relative 47  43 - 77 %    Lymphocytes Relative 38  12 - 46 %    Monocytes Relative 12  3 - 12 %    Eosinophils Relative 3  0 - 5 %    Basophils Relative 0  0 - 1 %    Neutro Abs 2.3  1.7 - 7.7 K/uL    Lymphs Abs 1.9  0.7 - 4.0 K/uL    Monocytes Absolute 0.6  0.1 - 1.0 K/uL    Eosinophils Absolute 0.2  0.0 - 0.7 K/uL    Basophils Absolute 0.0  0.0 - 0.1 K/uL    Smear Review        Value: PLATELET CLUMPS NOTED ON SMEAR, COUNT APPEARS DECREASED  BASIC METABOLIC PANEL     Status: Abnormal   Collection Time   11/30/11 12:20 AM      Component Value Range Comment   Sodium 138  135 - 145 mEq/L    Potassium 4.0  3.5 - 5.1 mEq/L    Chloride 104  96 - 112 mEq/L    CO2 22  19 - 32 mEq/L    Glucose, Bld 109 (*) 70 - 99 mg/dL    BUN 9  6 - 23 mg/dL    Creatinine, Ser 4.09  0.50 - 1.10 mg/dL    Calcium 81.1  8.4 - 10.5 mg/dL    GFR calc non Af Amer >90  >90 mL/min    GFR calc Af Amer >90  >90 mL/min   LIPASE, BLOOD     Status: Normal   Collection Time   11/30/11 12:48 AM      Component Value Range Comment   Lipase 17  11 - 59 U/L   HEPATIC FUNCTION PANEL     Status: Abnormal   Collection Time   11/30/11 12:48 AM      Component Value Range Comment    Total Protein 7.9  6.0 - 8.3 g/dL    Albumin 3.0 (*) 3.5 - 5.2 g/dL    AST 52 (*) 0 - 37 U/L    ALT 18  0 - 35 U/L    Alkaline Phosphatase 57  39 - 117 U/L    Total Bilirubin 0.3  0.3 - 1.2 mg/dL    Bilirubin, Direct 0.1  0.0 - 0.3 mg/dL HEMOLYZED SPECIMEN, RESULTS MAY BE AFFECTED   Indirect Bilirubin 0.2 (*) 0.3 - 0.9 mg/dL   URINALYSIS, ROUTINE W REFLEX MICROSCOPIC     Status: Abnormal   Collection Time   11/30/11  1:48 AM      Component Value Range Comment   Color, Urine YELLOW  YELLOW    APPearance CLEAR  CLEAR    Specific Gravity, Urine <1.005 (*) 1.005 - 1.030    pH 6.0  5.0 - 8.0    Glucose, UA NEGATIVE  NEGATIVE mg/dL    Hgb urine dipstick SMALL (*) NEGATIVE    Bilirubin Urine NEGATIVE  NEGATIVE    Ketones, ur NEGATIVE  NEGATIVE mg/dL    Protein, ur NEGATIVE  NEGATIVE mg/dL    Urobilinogen, UA 1.0  0.0 - 1.0 mg/dL    Nitrite NEGATIVE  NEGATIVE    Leukocytes, UA NEGATIVE  NEGATIVE   URINE MICROSCOPIC-ADD ON  Status: Normal   Collection Time   11/30/11  1:48 AM      Component Value Range Comment   Squamous Epithelial / LPF RARE  RARE    WBC, UA 0-2  <3 WBC/hpf    RBC / HPF 3-6  <3 RBC/hpf    Bacteria, UA RARE  RARE      Radiological Exams on Admission: Dg Chest 1 View  11/30/2011  *RADIOLOGY REPORT*  Clinical Data: Altered mental status.  History of multiple sclerosis, seizures and hypertension.  CHEST - 1 VIEW  Comparison: 09/09/2011 and 09/07/2011.  Findings: 0117 hours.  The heart size and mediastinal contours are stable.  There is mild left lower lobe atelectasis.  No consolidation, edema or pleural effusion is present.  Telemetry leads overlie the chest.  IMPRESSION: Minimal left basilar atelectasis.  No acute chest process identified.   Original Report Authenticated By: Gerrianne Scale, M.D.    Ct Head Wo Contrast  11/30/2011  *RADIOLOGY REPORT*  Clinical Data: Unresponsive.  History of multiple sclerosis, seizures and hypertension.  CT HEAD WITHOUT CONTRAST   Technique:  Contiguous axial images were obtained from the base of the skull through the vertex without contrast.  Comparison: Head CT 07/14/2010.  Findings: Severe diffuse cerebral and cerebellar atrophy is again noted.  There is stable slightly asymmetric dilatation of the right lateral ventricle.  Underlying periventricular white matter disease appears stable.  There is no evidence of acute intracranial hemorrhage, mass lesion, brain edema or extra-axial fluid collection.  The visualized paranasal sinuses are clear.  The calvarium is intact.  IMPRESSION: Stable chronic severe atrophy and periventricular white matter disease consistent with multiple sclerosis.  No acute intracranial findings.   Original Report Authenticated By: Gerrianne Scale, M.D.     EKG: Independently reviewed. Normal sinus rhythm; nonspecific ST changes   Assessment/Plan Present on Admission:  .Altered mental status .Lethargy .Decreased responsiveness, improving  Probable medication induced; patient now lifting her head in response to painful stimuli  .Multiple sclerosis .Seizures .Hemiparesis, left .Hypertension .Hypothyroid .Depression .Aphasia .Obesity .Muscle weakness .Thrombocytopenia   PLAN: Admit to step down unit for supportive care; keep n.p.o., And give necessary  intravenous medications as needed. patient's psychotropic medication recommendations will need adjustment to discharge   Other plans as per orders.  Code Status: Full Family Communication: No family present at the interview  Disposition Plan: Anticipate discharge back to the nursing home by tomorrow. If becomes more alert.  Maykayla Highley Nocturnist [facility] Pager [phone]   11/30/2011, 6:12 AM

## 2011-11-30 NOTE — ED Notes (Signed)
Patient resting in bed with eyes closed.  Sonia at [facility] notified that patient will be admitted.

## 2011-11-30 NOTE — ED Provider Notes (Signed)
History     CSN: [id number]  Arrival date & time 11/29/11  2257   First MD Initiated Contact with Patient 11/30/11 0031      Chief Complaint  Patient presents with  . Altered Mental Status    (Consider location/radiation/quality/duration/timing/severity/associated sxs/prior treatment) The history is provided by the nursing home and the EMS personnel.  Level V caveat applies due to Altered Mental Status. Brought in by EMS from Nursing facility, for unresponsiveness, altered LOC, according to EMS report staff noted patient to be unresponsive at 2150, they started chest compressions but patient partially woke up and sat up but then back to sleep. EMS noted blood sugar 119 and initial BP was 62/40. IV fluid bolus given enroute.  Narcan given too, no significant change.   Nursing home gave patient 10mg  of oxycodone and 2 mg of ativan before this event.  Here patient is responsive only to painful stimili, not currently hypotensive, BP 142/87.  Past Medical History  Diagnosis Date  . Multiple sclerosis   . Seizures   . Dysphagia   . Dehydration   . Hypertension   . Hemiparesis, left   . Pneumonia   . Thrombocytopenia   . Aphasia   . Depression   . Hypothyroid   . Esophageal reflux   . Stroke   . Lack of coordination   . Contracture of ankle and foot joint   . Obesity   . Muscle weakness   . Dysphagia 12/01/2011  . Chronic pain syndrome 12/01/2011    History reviewed. No pertinent past surgical history.  History reviewed. No pertinent family history.  History  Substance Use Topics  . Smoking status: Never Smoker   . Smokeless tobacco: Not on file  . Alcohol Use: No    OB History    Grav Para Term Preterm Abortions TAB SAB Ect Mult Living                  Review of Systems  Unable to perform ROS Level V caveat  Allergies  Review of patient's allergies indicates no known allergies.  Home Medications   Current Outpatient Rx  Name Route Sig Dispense Refill  .  OXYCODONE HCL 5 MG PO TABS Oral Take 1 tablet (5 mg total) by mouth every 4 (four) hours as needed. 30 tablet 0    BP 136/86  Pulse 54  Temp 98.7 F (37.1 C) (Axillary)  Resp 17  Ht 5\' 4"  (1.626 m)  Wt 143 lb 11.8 oz (65.2 kg)  BMI 24.67 kg/m2  SpO2 98%  Physical Exam  Constitutional: She appears well-developed. No distress.       Responsive only painful stimuli  HENT:  Head: Normocephalic and atraumatic.  Mouth/Throat: Oropharynx is clear and moist.  Eyes: Pupils are equal, round, and reactive to light.       3 mm pupils  Neck: Neck supple.  Cardiovascular: Normal rate, regular rhythm and normal heart sounds.   No murmur heard. Pulmonary/Chest: Effort normal and breath sounds normal. No respiratory distress. She has no wheezes. She has no rales.  Abdominal: Soft. Bowel sounds are normal. She exhibits no distension. There is no tenderness.  Musculoskeletal: She exhibits no tenderness.  Lymphadenopathy:    She has no cervical adenopathy.  Neurological:       Responding to painful stimuli only, mumbles some.  Skin: Skin is warm. No rash noted. No erythema.    ED Course  Procedures (including critical care time)  Labs Reviewed  BASIC  METABOLIC PANEL - Abnormal; Notable for the following:    Glucose, Bld 109 (*)     All other components within normal limits  HEPATIC FUNCTION PANEL - Abnormal; Notable for the following:    Albumin 3.0 (*)     AST 52 (*)     Indirect Bilirubin 0.2 (*)     All other components within normal limits  URINALYSIS, ROUTINE W REFLEX MICROSCOPIC - Abnormal; Notable for the following:    Specific Gravity, Urine <1.005 (*)     Hgb urine dipstick SMALL (*)     All other components within normal limits  CBC - Abnormal; Notable for the following:    Platelets 106 (*)     All other components within normal limits  CBC - Abnormal; Notable for the following:    Platelets 94 (*)     All other components within normal limits  CBC WITH DIFFERENTIAL    LIPASE, BLOOD  URINE CULTURE  URINE MICROSCOPIC-ADD ON  MRSA PCR SCREENING  TSH  T4, FREE  VITAMIN B12  BASIC METABOLIC PANEL  VALPROIC ACID LEVEL   No results found. Results for orders placed during the hospital encounter of 11/29/11  CBC WITH DIFFERENTIAL      Component Value Range   WBC 5.0  4.0 - 10.5 K/uL   RBC 4.52  3.87 - 5.11 MIL/uL   Hemoglobin 14.1  12.0 - 15.0 g/dL   HCT 23.7  62.8 - 31.5 %   MCV 96.0  78.0 - 100.0 fL   MCH 31.2  26.0 - 34.0 pg   MCHC 32.5  30.0 - 36.0 g/dL   RDW 17.6  16.0 - 73.7 %   Platelets HIDE  150 - 400 K/uL   Neutrophils Relative 47  43 - 77 %   Lymphocytes Relative 38  12 - 46 %   Monocytes Relative 12  3 - 12 %   Eosinophils Relative 3  0 - 5 %   Basophils Relative 0  0 - 1 %   Neutro Abs 2.3  1.7 - 7.7 K/uL   Lymphs Abs 1.9  0.7 - 4.0 K/uL   Monocytes Absolute 0.6  0.1 - 1.0 K/uL   Eosinophils Absolute 0.2  0.0 - 0.7 K/uL   Basophils Absolute 0.0  0.0 - 0.1 K/uL   Smear Review       Value: PLATELET CLUMPS NOTED ON SMEAR, COUNT APPEARS DECREASED  BASIC METABOLIC PANEL      Component Value Range   Sodium 138  135 - 145 mEq/L   Potassium 4.0  3.5 - 5.1 mEq/L   Chloride 104  96 - 112 mEq/L   CO2 22  19 - 32 mEq/L   Glucose, Bld 109 (*) 70 - 99 mg/dL   BUN 9  6 - 23 mg/dL   Creatinine, Ser 1.06  0.50 - 1.10 mg/dL   Calcium 26.9  8.4 - 48.5 mg/dL   GFR calc non Af Amer >90  >90 mL/min   GFR calc Af Amer >90  >90 mL/min  LIPASE, BLOOD      Component Value Range   Lipase 17  11 - 59 U/L  HEPATIC FUNCTION PANEL      Component Value Range   Total Protein 7.9  6.0 - 8.3 g/dL   Albumin 3.0 (*) 3.5 - 5.2 g/dL   AST 52 (*) 0 - 37 U/L   ALT 18  0 - 35 U/L   Alkaline Phosphatase 57  39 -  117 U/L   Total Bilirubin 0.3  0.3 - 1.2 mg/dL   Bilirubin, Direct 0.1  0.0 - 0.3 mg/dL   Indirect Bilirubin 0.2 (*) 0.3 - 0.9 mg/dL  URINALYSIS, ROUTINE W REFLEX MICROSCOPIC      Component Value Range   Color, Urine YELLOW  YELLOW   APPearance  CLEAR  CLEAR   Specific Gravity, Urine <1.005 (*) 1.005 - 1.030   pH 6.0  5.0 - 8.0   Glucose, UA NEGATIVE  NEGATIVE mg/dL   Hgb urine dipstick SMALL (*) NEGATIVE   Bilirubin Urine NEGATIVE  NEGATIVE   Ketones, ur NEGATIVE  NEGATIVE mg/dL   Protein, ur NEGATIVE  NEGATIVE mg/dL   Urobilinogen, UA 1.0  0.0 - 1.0 mg/dL   Nitrite NEGATIVE  NEGATIVE   Leukocytes, UA NEGATIVE  NEGATIVE  URINE CULTURE      Component Value Range   Specimen Description URINE, CATHETERIZED     Special Requests NONE     Culture  Setup Time 11/30/2011 02:20     Colony Count NO GROWTH     Culture NO GROWTH     Report Status 11/30/2011 FINAL    URINE MICROSCOPIC-ADD ON      Component Value Range   Squamous Epithelial / LPF RARE  RARE   WBC, UA 0-2  <3 WBC/hpf   RBC / HPF 3-6  <3 RBC/hpf   Bacteria, UA RARE  RARE  MRSA PCR SCREENING      Component Value Range   MRSA by PCR NEGATIVE  NEGATIVE  TSH      Component Value Range   TSH 0.437  0.350 - 4.500 uIU/mL  T4, FREE      Component Value Range   Free T4 1.48  0.80 - 1.80 ng/dL  VITAMIN J81      Component Value Range   Vitamin B-12 879  211 - 911 pg/mL  CBC      Component Value Range   WBC 7.6  4.0 - 10.5 K/uL   RBC 4.08  3.87 - 5.11 MIL/uL   Hemoglobin 12.6  12.0 - 15.0 g/dL   HCT 19.1  47.8 - 29.5 %   MCV 97.3  78.0 - 100.0 fL   MCH 30.9  26.0 - 34.0 pg   MCHC 31.7  30.0 - 36.0 g/dL   RDW 62.1  30.8 - 65.7 %   Platelets 106 (*) 150 - 400 K/uL  BASIC METABOLIC PANEL      Component Value Range   Sodium 144  135 - 145 mEq/L   Potassium 4.3  3.5 - 5.1 mEq/L   Chloride 109  96 - 112 mEq/L   CO2 24  19 - 32 mEq/L   Glucose, Bld 83  70 - 99 mg/dL   BUN 8  6 - 23 mg/dL   Creatinine, Ser 8.46  0.50 - 1.10 mg/dL   Calcium 9.8  8.4 - 96.2 mg/dL   GFR calc non Af Amer >90  >90 mL/min   GFR calc Af Amer >90  >90 mL/min  VALPROIC ACID LEVEL      Component Value Range   Valproic Acid Lvl 53.5  50.0 - 100.0 ug/mL  CBC      Component Value Range    WBC 6.4  4.0 - 10.5 K/uL   RBC 4.01  3.87 - 5.11 MIL/uL   Hemoglobin 12.3  12.0 - 15.0 g/dL   HCT 95.2  84.1 - 32.4 %   MCV 94.8  78.0 -  100.0 fL   MCH 30.7  26.0 - 34.0 pg   MCHC 32.4  30.0 - 36.0 g/dL   RDW 08.6  57.8 - 46.9 %   Platelets 94 (*) 150 - 400 K/uL    Date: 11/30/2011  Rate: 62  Rhythm: normal sinus rhythm  QRS Axis: normal  Intervals: normal  ST/T Wave abnormalities: nonspecific T wave changes  Conduction Disutrbances:none  Narrative Interpretation:   Old EKG Reviewed: none available    1. Altered mental status   2. Decreased responsiveness   3. Hypothyroid   4. Multiple sclerosis   5. Seizures   6. Hemiparesis, left   7. Hypertension   8. Depression   9. Aphasia   10. Obesity   11. Muscle weakness   12. Thrombocytopenia   13. Lethargy   14. Bradycardia   15. Dysphagia   16. Chronic pain syndrome       MDM  Patient observed in ED still not waking up after several hours, known hx of dementia. Clinically most likely patient was oversedated by oxycodone and ativan, work up without specific findings. CXR negative for pneumonia, head ct no acute findings, no significant electrolyte changes.   Hospitalist will admit.         Shelda Jakes, MD 12/02/11 [phone]

## 2011-11-30 NOTE — Progress Notes (Signed)
UR Chart Review Completed  

## 2011-11-30 NOTE — Progress Notes (Signed)
The patient is a 61 year old woman with severe multiple sclerosis, seizures, left hemiplegia, and recurrent aspiration, who was admitted this morning by Dr. Orvan Falconer for unresponsiveness. Apparently, prior to arrival to the emergency department, she was given intramuscular Ativan and oxycodone. She did not respond to Narcan in the emergency department. The patient is still sleeping and intermittently lethargic. She is moving her arms and attempting to open her eyes to voice. Nursing reports that she did grimace when the Foley catheter was inserted. She is currently afebrile, borderline bradycardic, and otherwise hemodynamically stable. She is oxygenating 100% on room air. We'll decrease her IV fluids. We'll continue giving Keppra, Synthroid, and proton next IV until she becomes more alert. We'll order an EEG, vitamin B12, and free T4. TSH is are ready been ordered.

## 2011-12-01 ENCOUNTER — Encounter (HOSPITAL_COMMUNITY): Payer: Self-pay | Admitting: Internal Medicine

## 2011-12-01 DIAGNOSIS — G894 Chronic pain syndrome: Secondary | ICD-10-CM

## 2011-12-01 DIAGNOSIS — R569 Unspecified convulsions: Secondary | ICD-10-CM

## 2011-12-01 DIAGNOSIS — R001 Bradycardia, unspecified: Secondary | ICD-10-CM | POA: Diagnosis present

## 2011-12-01 DIAGNOSIS — R1310 Dysphagia, unspecified: Secondary | ICD-10-CM | POA: Diagnosis present

## 2011-12-01 HISTORY — DX: Dysphagia, unspecified: R13.10

## 2011-12-01 HISTORY — DX: Chronic pain syndrome: G89.4

## 2011-12-01 LAB — CBC
HCT: 39.7 % (ref 36.0–46.0)
MCH: 30.9 pg (ref 26.0–34.0)
MCV: 97.3 fL (ref 78.0–100.0)
Platelets: 106 10*3/uL — ABNORMAL LOW (ref 150–400)
RBC: 4.08 MIL/uL (ref 3.87–5.11)

## 2011-12-01 LAB — BASIC METABOLIC PANEL
BUN: 8 mg/dL (ref 6–23)
CO2: 24 mEq/L (ref 19–32)
Calcium: 9.8 mg/dL (ref 8.4–10.5)
Creatinine, Ser: 0.55 mg/dL (ref 0.50–1.10)

## 2011-12-01 LAB — VALPROIC ACID LEVEL: Valproic Acid Lvl: 53.5 ug/mL (ref 50.0–100.0)

## 2011-12-01 MED ORDER — ACETAMINOPHEN 325 MG PO TABS
650.0000 mg | ORAL_TABLET | Freq: Four times a day (QID) | ORAL | Status: DC | PRN
  Administered 2011-12-02: 650 mg via ORAL
  Filled 2011-12-01: qty 2

## 2011-12-01 MED ORDER — OXYCODONE HCL 5 MG PO TABS: 5.0000 mg | ORAL_TABLET | ORAL | Status: DC | PRN

## 2011-12-01 MED ORDER — ASPIRIN EC 81 MG PO TBEC
81.0000 mg | DELAYED_RELEASE_TABLET | Freq: Every day | ORAL | Status: DC
  Administered 2011-12-01 – 2011-12-02 (×2): 81 mg via ORAL
  Filled 2011-12-01 (×2): qty 1

## 2011-12-01 MED ORDER — DIVALPROEX SODIUM 125 MG PO CPSP
500.0000 mg | ORAL_CAPSULE | Freq: Three times a day (TID) | ORAL | Status: DC
  Administered 2011-12-01 – 2011-12-02 (×3): 500 mg via ORAL
  Filled 2011-12-01 (×10): qty 4

## 2011-12-01 MED ORDER — SODIUM CHLORIDE 0.9 % IJ SOLN
INTRAMUSCULAR | Status: AC
  Administered 2011-12-01: 10 mL
  Filled 2011-12-01: qty 3

## 2011-12-01 MED ORDER — LEVETIRACETAM 500 MG PO TABS
2000.0000 mg | ORAL_TABLET | Freq: Two times a day (BID) | ORAL | Status: DC
  Administered 2011-12-01 – 2011-12-02 (×3): 2000 mg via ORAL
  Filled 2011-12-01 (×3): qty 4

## 2011-12-01 MED ORDER — LEVOTHYROXINE SODIUM 112 MCG PO TABS
112.0000 ug | ORAL_TABLET | Freq: Every day | ORAL | Status: DC
  Administered 2011-12-01 – 2011-12-02 (×2): 112 ug via ORAL
  Filled 2011-12-01 (×4): qty 1

## 2011-12-01 NOTE — Clinical Social Work Note (Signed)
Prepared FL2 and spoke w facility to update.on patient progress.    Santa Genera, LCSW Clinical Social Worker [phone])

## 2011-12-01 NOTE — Progress Notes (Signed)
Nutrition Brief Note  Patient identified as nutrition risk due to a low braden score, generating a score of 11.   Body mass index is 24.64 kg/(m^2). Pt meets criteria for normal weight based on current BMI.   Current diet order is dysphagia 1, patient is consuming approximately 50% of meals at this time. Labs and medications reviewed.   No nutrition interventions warranted at this time. If nutrition issues arise, please consult RD.   Melody Haver, RD, LDN Pager: [phone]

## 2011-12-01 NOTE — Progress Notes (Signed)
PT BEING TRANSFERRED TO ROOM 316 ON TELEMETRY. REMAIN IN SINUS BRADYCARDIA. PT IS ALERT AND RESPONSIVE. ABLE TO ANSWER YES AND NO QUESTIONS NONVERBALLY. PT FED BREAKFAST AND LUNCH DYSPHAGIA 3 DIET W/ NECTAR THICK LIQUIDS W/O ANY DIFFICULTY WHILE SITTING AT 90 DEGREE ANGLE. LUNGS CLEAR.  FOLEY CATHETER PATENT DRAINING CLEAR AMBER URINE. IV IN RT LATERAL AC INFUSUING W/O DIFFICULTY. REPORT CALLED TO TAMIKA WATKINS RN ON 300.

## 2011-12-01 NOTE — Progress Notes (Addendum)
Subjective:  Uneventful overnight. The patient is more alert. She is virtually mute but this is chronic according to history. She does try to answer, but just "uhm"    Objective: Vital signs in last 24 hours: Filed Vitals:   12/01/11 0600 12/01/11 0700 12/01/11 0741 12/01/11 0800  BP: 127/97 127/97    Pulse: 60 57    Temp:    98.1 F (36.7 C)  TempSrc:    Oral  Resp: 16 15    Height:      Weight:      SpO2: 98% 99% 100%     Intake/Output Summary (Last 24 hours) at 12/01/11 0854 Last data filed at 12/01/11 0700  Gross per 24 hour  Intake 2112.08 ml  Output    650 ml  Net 1462.08 ml    Weight change: 3.9 kg (8 lb 9.6 oz)  Physical exam: General: 61 year old African-American woman who appears chronically debilitated, much more alert, no acute distress. Lungs: Clear anteriorly with decreased breath sounds in the bases. Next line heart: S1, S2, with mild bradycardia and a soft systolic murmur. Abdomen: Positive bowel sounds, soft, nontender, nondistended. Extremities: Mild flexion contractures in her hands. Trace of pedal edema bilaterally. Neurologic: She is alert, but not likely oriented. She is virtually mute. She does not follow directions, but tries to answer questions. She moves her upper extremities with provocation but is unable to grab the examiner's hands. She moves her feet less so with provocation.  Lab Results: Basic Metabolic Panel:  Basename 12/01/11 0515 11/30/11 0020  NA 144 138  K 4.3 4.0  CL 109 104  CO2 24 22  GLUCOSE 83 109*  BUN 8 9  CREATININE 0.55 0.59  CALCIUM 9.8 10.0  MG -- --  PHOS -- --   Liver Function Tests:  [facility] 11/30/11 0048  AST 52*  ALT 18  ALKPHOS 57  BILITOT 0.3  PROT 7.9  ALBUMIN 3.0*    Basename 11/30/11 0048  LIPASE 17  AMYLASE --   No results found for this basename: AMMONIA:2 in the last 72 hours CBC:  Basename 12/01/11 0515 11/30/11 0020  WBC 7.6 5.0  NEUTROABS -- 2.3  HGB 12.6 14.1  HCT 39.7 43.4    MCV 97.3 96.0  PLT 106* HIDE   Cardiac Enzymes: No results found for this basename: CKTOTAL:3,CKMB:3,CKMBINDEX:3,TROPONINI:3 in the last 72 hours BNP: No results found for this basename: PROBNP:3 in the last 72 hours D-Dimer: No results found for this basename: DDIMER:2 in the last 72 hours CBG: No results found for this basename: GLUCAP:6 in the last 72 hours Hemoglobin A1C: No results found for this basename: HGBA1C in the last 72 hours Fasting Lipid Panel: No results found for this basename: CHOL,HDL,LDLCALC,TRIG,CHOLHDL,LDLDIRECT in the last 72 hours Thyroid Function Tests:  Basename 11/30/11 1241 11/30/11 0823  TSH -- 0.437  T4TOTAL -- --  FREET4 1.48 --  T3FREE -- --  Marko Plume -- --   Anemia Panel:  Basename 11/30/11 1241  VITAMINB12 879  FOLATE --  FERRITIN --  TIBC --  IRON --  RETICCTPCT --   Coagulation: No results found for this basename: LABPROT:2,INR:2 in the last 72 hours Urine Drug Screen: Drugs of Abuse  No results found for this basename: labopia, cocainscrnur, labbenz, amphetmu, thcu, labbarb    Alcohol Level: No results found for this basename: ETH:2 in the last 72 hours Urinalysis:  Basename 11/30/11 0148  COLORURINE YELLOW  LABSPEC <1.005*  PHURINE 6.0  GLUCOSEU NEGATIVE  HGBUR  SMALL*  BILIRUBINUR NEGATIVE  KETONESUR NEGATIVE  PROTEINUR NEGATIVE  UROBILINOGEN 1.0  NITRITE NEGATIVE  LEUKOCYTESUR NEGATIVE   Misc. Labs:  Micro: Recent Results (from the past 240 hour(s))  URINE CULTURE     Status: Normal   Collection Time   11/30/11  1:48 AM      Component Value Range Status Comment   Specimen Description URINE, CATHETERIZED   Final    Special Requests NONE   Final    Culture  Setup Time 11/30/2011 02:20   Final    Colony Count NO GROWTH   Final    Culture NO GROWTH   Final    Report Status 11/30/2011 FINAL   Final   MRSA PCR SCREENING     Status: Normal   Collection Time   11/30/11  6:51 AM      Component Value Range Status  Comment   MRSA by PCR NEGATIVE  NEGATIVE Final     Studies/Results: Dg Chest 1 View  11/30/2011  *RADIOLOGY REPORT*  Clinical Data: Altered mental status.  History of multiple sclerosis, seizures and hypertension.  CHEST - 1 VIEW  Comparison: 09/09/2011 and 09/07/2011.  Findings: 0117 hours.  The heart size and mediastinal contours are stable.  There is mild left lower lobe atelectasis.  No consolidation, edema or pleural effusion is present.  Telemetry leads overlie the chest.  IMPRESSION: Minimal left basilar atelectasis.  No acute chest process identified.   Original Report Authenticated By: Gerrianne Scale, M.D.    Ct Head Wo Contrast  11/30/2011  *RADIOLOGY REPORT*  Clinical Data: Unresponsive.  History of multiple sclerosis, seizures and hypertension.  CT HEAD WITHOUT CONTRAST  Technique:  Contiguous axial images were obtained from the base of the skull through the vertex without contrast.  Comparison: Head CT 07/14/2010.  Findings: Severe diffuse cerebral and cerebellar atrophy is again noted.  There is stable slightly asymmetric dilatation of the right lateral ventricle.  Underlying periventricular white matter disease appears stable.  There is no evidence of acute intracranial hemorrhage, mass lesion, brain edema or extra-axial fluid collection.  The visualized paranasal sinuses are clear.  The calvarium is intact.  IMPRESSION: Stable chronic severe atrophy and periventricular white matter disease consistent with multiple sclerosis.  No acute intracranial findings.   Original Report Authenticated By: Gerrianne Scale, M.D.     Medications:  Scheduled:   . antiseptic oral rinse  15 mL Mouth Rinse q12n4p  . chlorhexidine  15 mL Mouth Rinse BID  . levetiracetam  1,500 mg Intravenous Q12H  . levothyroxine  56 mcg Intravenous Daily  . pantoprazole (PROTONIX) IV  40 mg Intravenous Q24H  . sodium chloride      . sodium chloride       Continuous:   . 0.9 % NaCl with KCl 20 mEq / L 75 mL/hr  at 12/01/11 0850   WUJ:[id number] (ZOFRAN) IV, sodium phosphate  Assessment: Principal Problem:  *Altered mental status Active Problems:  Multiple sclerosis  Seizures  Hemiparesis, left  Hypertension  Hypothyroid  Depression  Aphasia  Obesity  Muscle weakness  Thrombocytopenia  Lethargy  Decreased responsiveness  Bradycardia   1. Decreased responsiveness/altered mental status-encephalopathy. Etiology not exactly known, but the differential diagnoses include seizure with postictal encephalopathy and encephalopathy secondary to multiple psychotropic medications given at the skilled nursing facility.  Chronically bedridden with left-sided hemiplegia secondary to end-stage multiple sclerosis.  Seizure disorder. She is treated with Keppra and query Depakote. EEG results pending.  Hypothyroidism. She is  treated chronically with Synthroid. Her TSH and free T4 are within normal limits.  Thrombocytopenia. Etiology not certain. Her vitamin B12 level and thyroid function are within normal limits. Possibly secondary to some bone marrow suppression from Keppra and query Depakote.  Probable chronic pain syndrome. She is on OxyContin and Zanaflex chronically. She does not appear to be in any pain now.  Chronic dysphagia. Per report, she is on a pured diet.   Plan:  1. We'll decrease IV fluids. We'll start pured diet. 2. Will discontinue IV Keppra and Synthroid and start him orally. We'll hold OxyContin and Zanaflex for now. 3. Continue supportive treatment. 4. Check the results of the EEG pending. We'll check a valproic acid level and review her home medications more closely to see if she was truly on Depakote. 5. Possible discharge tomorrow.   LOS: 2 days   Stoney Karczewski 12/01/2011, 8:54 AM

## 2011-12-02 DIAGNOSIS — D696 Thrombocytopenia, unspecified: Secondary | ICD-10-CM

## 2011-12-02 LAB — CBC
HCT: 38 % (ref 36.0–46.0)
Hemoglobin: 12.3 g/dL (ref 12.0–15.0)
MCH: 30.7 pg (ref 26.0–34.0)
MCHC: 32.4 g/dL (ref 30.0–36.0)
MCV: 94.8 fL (ref 78.0–100.0)
Platelets: 94 K/uL — ABNORMAL LOW (ref 150–400)
RBC: 4.01 MIL/uL (ref 3.87–5.11)
RDW: 15.1 % (ref 11.5–15.5)
WBC: 6.4 K/uL (ref 4.0–10.5)

## 2011-12-02 MED ORDER — OXYCODONE HCL 5 MG PO TABS: 5.0000 mg | ORAL_TABLET | ORAL | Status: AC | PRN

## 2011-12-02 NOTE — Discharge Summary (Signed)
Physician Discharge Summary  [name] ZOX:[id number] DOB: 1950/07/05 DOA: 11/29/2011  PCP: No primary provider on file.  Admit date: 11/29/2011 Discharge date: 12/02/2011  Recommendations for Outpatient Follow-up:  1. The patient was discharged to [facility] skilled nursing facility in improved condition. EEG results were pending at the time of discharge. The results will be reviewed when available.  Discharge Diagnoses:  1. Altered mental status/encephalopathy/decreased responsiveness. Possibly secondary to psychotropic medications versus post ictal delirium. Resolved. 2. Seizure disorder. EEG results pending. 3. Thrombocytopenia, possibly secondary to chronic medications. The patient's platelet count was 94,000 at the time of discharge. Vitamin B12 level and thyroid function were within normal limits. 4. Mild bradycardia. 5. End-stage multiple sclerosis. 6. Chronic left-sided hemiparesis. 7. Hypothyroidism. 8. Chronic expressive aphasia/dysphagia secondary to severe multiple sclerosis. On a pured diet with nectar thickened liquids. 9. Chronic pain syndrome, on chronic opiates. 10. Hypertension. 11. Isolated elevated SGOT.  Discharge Condition: Improved.  Diet recommendation: Dysphagia 1/pured with nectar thickened liquids.  Filed Weights   11/30/11 0600 12/01/11 0500 12/02/11 0455  Weight: 61.2 kg (134 lb 14.7 oz) 65.1 kg (143 lb 8.3 oz) 65.2 kg (143 lb 11.8 oz)    History of present illness:  The patient is a 61 year old woman with a history significant for severe multiple sclerosis, bedridden, left-sided hemiplegia, and recurrent aspiration, who presented to the emergency department on 11/30/2011 with unresponsiveness at [facility] skilled nursing facility. Apparently, the patient was given intramuscular Ativan and oxycodone prior to arrival to the emergency department. When she arrived, she was in a deep sleep. She did not respond to Narcan. She was afebrile and  hemodynamically stable. She was oxygenating 98% on nasal cannula oxygen. Her lab data was remarkable only for an AST of 52. Her urinalysis revealed no leukocytes. CT scan of her head revealed stable chronic severe atrophy and white matter disease consistent with multiple sclerosis, but no acute intracranial findings. Her EKG revealed normal sinus rhythm and nonspecific ST abnormalities. Her chest x-ray revealed left basilar atelectasis. She was admitted for further evaluation and management.  Hospital Course:  The patient was started on supportive treatment with IV fluids. She was made to be n.p.o. until she became more alert. Or was given intravenously. Synthroid was given intravenously. All of her other psychotropic and sedating medications were withheld. For further evaluation, a number of studies were ordered. Her TSH was within normal limits at 0.43. Her free T4 was within normal limits at 1.48. Her valproic acid level was 53.5, in the therapeutic range. Her vitamin B12 level was 879, within normal limits. The results of her EEG were pending at the time of discharge.  The patient became much more alert on hospital day #2. In fact, she stated that she was doing "all right" on the day of discharge. She was restarted on a pured diet with nectar thickened liquids. For the most part, she ate well. She remained hemodynamically stable and afebrile. Her platelet count ranged from 94-106,000. As stated above, her thyroid function and vitamin B12 levels were within normal limits. It is likely, that her thrombocytopenia is secondary to an element of bone marrow suppression from Depakote. She was eventually restarted on Depakote, however. When necessary oxycodone was given in place of long acting OxyContin. Each bedtime Ativan was discontinued. Zanaflex was restarted upon discharge. It is important to note that the patient treated with multiple psychotropic and sedating medications. Although there is a suspicion  that the patient may have had a seizure  and became post ictal, her altered mental status/lethargy could have also been secondary to the multiple sedating medications she is on. For this reason, OxyContin and each bedtime Ativan were discontinued. When necessary oxycodone was prescribed instead for pain.  Procedures:  EEG.   Consultations:  None.  Discharge Exam: Filed Vitals:   12/01/11 1300 12/01/11 2243 12/02/11 0455 12/02/11 0701  BP: 155/116 163/95 153/76   Pulse: 55 60 62   Temp:  98.6 F (37 C) 100.6 F (38.1 C) 98.3 F (36.8 C)  TempSrc:  Axillary Axillary Axillary  Resp:  18 17   Height:      Weight:   65.2 kg (143 lb 11.8 oz)   SpO2: 100% 100% 100%     General: debilitated, but alert 61 year old African-American woman lying in bed, in no acute distress. Cardiovascular: S1, S2, with a soft systolic murmur. Respiratory: clear to auscultation bilaterally. Neurologic: She is alert. She has chronic dysarthria. She says "all right". She nods no to pain. She is moving her extremities with decrease movement on the left.  Discharge Instructions  Discharge Orders    Future Orders Please Complete By Expires   Diet - low sodium heart healthy      Increase activity slowly        Medication List  As of 12/02/2011 10:55 AM   STOP taking these medications         LORazepam 0.5 MG tablet      oxyCODONE 10 MG 12 hr tablet         TAKE these medications         aspirin EC 81 MG tablet   Take 81 mg by mouth daily.      divalproex 500 MG DR tablet   Commonly known as: DEPAKOTE   Take 500 mg by mouth 3 (three) times daily.      interferon beta-1a 30 MCG/0.5ML injection   Commonly known as: AVONEX   Inject 30 mcg into the muscle every 7 (seven) days. On Tuesday      levETIRAcetam 1000 MG tablet   Commonly known as: KEPPRA   Take 2,000 mg by mouth 2 (two) times daily.      levETIRAcetam 250 MG tablet   Commonly known as: KEPPRA   Take 250 mg by mouth daily with  lunch.      levothyroxine 112 MCG tablet   Commonly known as: SYNTHROID, LEVOTHROID   Take 112 mcg by mouth daily.      LORazepam 2 MG/ML concentrated solution   Commonly known as: ATIVAN   Inject 2 mg into the muscle every 2 (two) hours as needed. Sign or symptoms of seizure activity      multivitamin with minerals tablet   Take 1 tablet by mouth daily.      oxyCODONE 5 MG immediate release tablet   Commonly known as: Oxy IR/ROXICODONE   Take 1 tablet (5 mg total) by mouth every 4 (four) hours as needed.      PATADAY 0.2 % Soln   Generic drug: Olopatadine HCl   Place 1 drop into both eyes as needed. itching      polyethylene glycol powder powder   Commonly known as: GLYCOLAX/MIRALAX   Take 17 g by mouth 2 (two) times daily.      ranitidine 150 MG tablet   Commonly known as: ZANTAC   Take 150 mg by mouth at bedtime.      tiZANidine 2 MG tablet   Commonly known as:  ZANAFLEX   Take 2 mg by mouth at bedtime.              The results of significant diagnostics from this hospitalization (including imaging, microbiology, ancillary and laboratory) are listed below for reference.    Significant Diagnostic Studies: Dg Chest 1 View  11/30/2011  *RADIOLOGY REPORT*  Clinical Data: Altered mental status.  History of multiple sclerosis, seizures and hypertension.  CHEST - 1 VIEW  Comparison: 09/09/2011 and 09/07/2011.  Findings: 0117 hours.  The heart size and mediastinal contours are stable.  There is mild left lower lobe atelectasis.  No consolidation, edema or pleural effusion is present.  Telemetry leads overlie the chest.  IMPRESSION: Minimal left basilar atelectasis.  No acute chest process identified.   Original Report Authenticated By: Gerrianne Scale, M.D.    Ct Head Wo Contrast  11/30/2011  *RADIOLOGY REPORT*  Clinical Data: Unresponsive.  History of multiple sclerosis, seizures and hypertension.  CT HEAD WITHOUT CONTRAST  Technique:  Contiguous axial images were obtained  from the base of the skull through the vertex without contrast.  Comparison: Head CT 07/14/2010.  Findings: Severe diffuse cerebral and cerebellar atrophy is again noted.  There is stable slightly asymmetric dilatation of the right lateral ventricle.  Underlying periventricular white matter disease appears stable.  There is no evidence of acute intracranial hemorrhage, mass lesion, brain edema or extra-axial fluid collection.  The visualized paranasal sinuses are clear.  The calvarium is intact.  IMPRESSION: Stable chronic severe atrophy and periventricular white matter disease consistent with multiple sclerosis.  No acute intracranial findings.   Original Report Authenticated By: Gerrianne Scale, M.D.     Microbiology: Recent Results (from the past 240 hour(s))  URINE CULTURE     Status: Normal   Collection Time   11/30/11  1:48 AM      Component Value Range Status Comment   Specimen Description URINE, CATHETERIZED   Final    Special Requests NONE   Final    Culture  Setup Time 11/30/2011 02:20   Final    Colony Count NO GROWTH   Final    Culture NO GROWTH   Final    Report Status 11/30/2011 FINAL   Final   MRSA PCR SCREENING     Status: Normal   Collection Time   11/30/11  6:51 AM      Component Value Range Status Comment   MRSA by PCR NEGATIVE  NEGATIVE Final      Labs: Basic Metabolic Panel:  Lab 12/01/11 1610 11/30/11 0020  NA 144 138  K 4.3 4.0  CL 109 104  CO2 24 22  GLUCOSE 83 109*  BUN 8 9  CREATININE 0.55 0.59  CALCIUM 9.8 10.0  MG -- --  PHOS -- --   Liver Function Tests:  Lab 11/30/11 0048  AST 52*  ALT 18  ALKPHOS 57  BILITOT 0.3  PROT 7.9  ALBUMIN 3.0*    Lab 11/30/11 0048  LIPASE 17  AMYLASE --   No results found for this basename: AMMONIA:5 in the last 168 hours CBC:  Lab 12/02/11 0628 12/01/11 0515 11/30/11 0020  WBC 6.4 7.6 5.0  NEUTROABS -- -- 2.3  HGB 12.3 12.6 14.1  HCT 38.0 39.7 43.4  MCV 94.8 97.3 96.0  PLT 94* 106* HIDE   Cardiac  Enzymes: No results found for this basename: CKTOTAL:5,CKMB:5,CKMBINDEX:5,TROPONINI:5 in the last 168 hours BNP: BNP (last 3 results) No results found for this basename: PROBNP:3 in  the last 8760 hours CBG: No results found for this basename: GLUCAP:5 in the last 168 hours  Time coordinating discharge: less than 30 minutes  Signed:  Azka Steger  [facility] 12/02/2011, 10:55 AM

## 2011-12-02 NOTE — Progress Notes (Signed)
Called report to [place], LPN at [facility] in [place], [place].  Verbalized understanding.  Pt dc'd to facility via EMS.  Schonewitz, Joli Koob 12/02/2011

## 2011-12-04 NOTE — Procedures (Signed)
[facility] Killian Schwer A. Gerilyn Pilgrim, MD     [email or web address]         NAME:  [name], [name]                 ACCOUNT NO.:  [id number]  MEDICAL RECORD NO.:  [id number]  LOCATION:  A316                          FACILITY:  [facility]  PHYSICIAN:  Kateleen Encarnacion A. Gerilyn Pilgrim, M.D. DATE OF BIRTH:  08-11-1950  DATE OF PROCEDURE: DATE OF DISCHARGE:  12/02/2011                             EEG INTERPRETATION   The patient is a 61 year old who has a known history of seizures who was found unresponsive.  She was expected to have unwitnessed seizure.  MEDICATIONS:  Depakote, Keppra, Ativan, Synthroid, oxycodone, Zantac, Zanaflex, Protonix.  ANALYSIS:  A 16-channel recording is conducted for 22 minutes.  There is a well-formed posterior dominant rhythm of 8 Hz, which attenuates with eye opening.  There is beta activity observed in frontal areas.  Awake and sleep activities are recorded.  There are K complexes and sleep spindles observed.  Photic stimulation is carried out without abnormal changes in the background activity.  There is no focal or lateralized slowing.  There is no epileptiform activity observed.  IMPRESSION:  Normal recording of awake and sleep states.  There is no epileptiform activity observed.     Solymar Grace A. Gerilyn Pilgrim, M.D.     KAD/MEDQ  D:  12/04/2011  T:  12/04/2011  Job:  [id number]

## 2014-05-21 ENCOUNTER — Encounter (HOSPITAL_COMMUNITY): Payer: Self-pay

## 2014-05-21 ENCOUNTER — Emergency Department (HOSPITAL_COMMUNITY)
Admission: EM | Admit: 2014-05-21 | Discharge: 2014-05-21 | Disposition: A | Discharge status: Home / Self Care | Payer: Medicare Other | Attending: Emergency Medicine | Admitting: Emergency Medicine

## 2014-05-21 ENCOUNTER — Emergency Department (HOSPITAL_COMMUNITY): Payer: Medicare Other

## 2014-05-21 DIAGNOSIS — Z8701 Personal history of pneumonia (recurrent): Secondary | ICD-10-CM | POA: Diagnosis not present

## 2014-05-21 DIAGNOSIS — F329 Major depressive disorder, single episode, unspecified: Secondary | ICD-10-CM | POA: Diagnosis not present

## 2014-05-21 DIAGNOSIS — E039 Hypothyroidism, unspecified: Secondary | ICD-10-CM | POA: Insufficient documentation

## 2014-05-21 DIAGNOSIS — E669 Obesity, unspecified: Secondary | ICD-10-CM | POA: Diagnosis not present

## 2014-05-21 DIAGNOSIS — R509 Fever, unspecified: Secondary | ICD-10-CM

## 2014-05-21 DIAGNOSIS — Z8781 Personal history of (healed) traumatic fracture: Secondary | ICD-10-CM | POA: Insufficient documentation

## 2014-05-21 DIAGNOSIS — Z9114 Patient's other noncompliance with medication regimen: Secondary | ICD-10-CM | POA: Diagnosis not present

## 2014-05-21 DIAGNOSIS — Z8673 Personal history of transient ischemic attack (TIA), and cerebral infarction without residual deficits: Secondary | ICD-10-CM | POA: Diagnosis not present

## 2014-05-21 DIAGNOSIS — Z7982 Long term (current) use of aspirin: Secondary | ICD-10-CM | POA: Diagnosis not present

## 2014-05-21 DIAGNOSIS — K219 Gastro-esophageal reflux disease without esophagitis: Secondary | ICD-10-CM | POA: Diagnosis not present

## 2014-05-21 DIAGNOSIS — Z79899 Other long term (current) drug therapy: Secondary | ICD-10-CM | POA: Diagnosis not present

## 2014-05-21 DIAGNOSIS — G40909 Epilepsy, unspecified, not intractable, without status epilepticus: Secondary | ICD-10-CM | POA: Insufficient documentation

## 2014-05-21 DIAGNOSIS — R569 Unspecified convulsions: Secondary | ICD-10-CM | POA: Diagnosis present

## 2014-05-21 DIAGNOSIS — I10 Essential (primary) hypertension: Secondary | ICD-10-CM | POA: Insufficient documentation

## 2014-05-21 DIAGNOSIS — Z862 Personal history of diseases of the blood and blood-forming organs and certain disorders involving the immune mechanism: Secondary | ICD-10-CM | POA: Insufficient documentation

## 2014-05-21 DIAGNOSIS — G894 Chronic pain syndrome: Secondary | ICD-10-CM | POA: Diagnosis not present

## 2014-05-21 LAB — URINALYSIS, ROUTINE W REFLEX MICROSCOPIC
Bilirubin Urine: NEGATIVE
Glucose, UA: NEGATIVE mg/dL
Ketones, ur: 15 mg/dL — AB
LEUKOCYTES UA: NEGATIVE
NITRITE: NEGATIVE
Protein, ur: NEGATIVE mg/dL
SPECIFIC GRAVITY, URINE: 1.025 (ref 1.005–1.030)
UROBILINOGEN UA: 1 mg/dL (ref 0.0–1.0)
pH: 6.5 (ref 5.0–8.0)

## 2014-05-21 LAB — CBC WITH DIFFERENTIAL/PLATELET
BASOS ABS: 0 10*3/uL (ref 0.0–0.1)
Basophils Relative: 0 % (ref 0–1)
EOS ABS: 0.2 10*3/uL (ref 0.0–0.7)
Eosinophils Relative: 2 % (ref 0–5)
HCT: 36 % (ref 36.0–46.0)
Hemoglobin: 11.5 g/dL — ABNORMAL LOW (ref 12.0–15.0)
LYMPHS ABS: 1.6 10*3/uL (ref 0.7–4.0)
Lymphocytes Relative: 20 % (ref 12–46)
MCH: 29.3 pg (ref 26.0–34.0)
MCHC: 31.9 g/dL (ref 30.0–36.0)
MCV: 91.8 fL (ref 78.0–100.0)
Monocytes Absolute: 1 10*3/uL (ref 0.1–1.0)
Monocytes Relative: 12 % (ref 3–12)
Neutro Abs: 5.3 10*3/uL (ref 1.7–7.7)
Neutrophils Relative %: 66 % (ref 43–77)
PLATELETS: 123 10*3/uL — AB (ref 150–400)
RBC: 3.92 MIL/uL (ref 3.87–5.11)
RDW: 17.2 % — AB (ref 11.5–15.5)
WBC: 8.1 10*3/uL (ref 4.0–10.5)

## 2014-05-21 LAB — BASIC METABOLIC PANEL
Anion gap: 3 — ABNORMAL LOW (ref 5–15)
BUN: 14 mg/dL (ref 6–23)
CHLORIDE: 113 mmol/L — AB (ref 96–112)
CO2: 23 mmol/L (ref 19–32)
CREATININE: 0.71 mg/dL (ref 0.50–1.10)
Calcium: 8.6 mg/dL (ref 8.4–10.5)
GFR, EST NON AFRICAN AMERICAN: 90 mL/min — AB (ref >90)
Glucose, Bld: 120 mg/dL — ABNORMAL HIGH (ref 70–99)
Potassium: 4 mmol/L (ref 3.5–5.1)
Sodium: 139 mmol/L (ref 135–145)

## 2014-05-21 LAB — URINE MICROSCOPIC-ADD ON

## 2014-05-21 LAB — VALPROIC ACID LEVEL: VALPROIC ACID LVL: 60.9 ug/mL (ref 50.0–100.0)

## 2014-05-21 MED ORDER — LEVETIRACETAM IN NACL 1000 MG/100ML IV SOLN
1000.0000 mg | Freq: Once | INTRAVENOUS | Status: AC
  Administered 2014-05-21: 1000 mg via INTRAVENOUS
  Filled 2014-05-21: qty 100

## 2014-05-21 MED ORDER — ACETAMINOPHEN 650 MG RE SUPP
650.0000 mg | Freq: Once | RECTAL | Status: AC
  Administered 2014-05-21: 650 mg via RECTAL
  Filled 2014-05-21: qty 1

## 2014-05-21 NOTE — ED Notes (Signed)
Pt from [facility], pt with seizures x couple of hours.  Pt with seizure history, reportedly would not take her keppra today, had multiple seizures, staff unable to get iv line in to give meds, was given 1mg  of ativan im at the facility.  EMS started an iv and gave 2 mg ativan en route.

## 2014-05-21 NOTE — Discharge Instructions (Signed)
You need to take your medication as prescribed, or you will have seizures!  Take acetaminophen for fever.  Epilepsy Epilepsy is a disorder in which a person has repeated seizures over time. A seizure is a release of abnormal electrical activity in the brain. Seizures can cause a change in attention, behavior, or the ability to remain awake and alert (altered mental status). Seizures often involve uncontrollable shaking (convulsions).  Most people with epilepsy lead normal lives. However, people with epilepsy are at an increased risk of falls, accidents, and injuries. Therefore, it is important to begin treatment right away. CAUSES  Epilepsy has many possible causes. Anything that disturbs the normal pattern of brain cell activity can lead to seizures. This may include:   Head injury.  Birth trauma.  High fever as a child.  Stroke.  Bleeding into or around the brain.  Certain drugs.  Prolonged low oxygen, such as what occurs after CPR efforts.  Abnormal brain development.  Certain illnesses, such as meningitis, encephalitis (brain infection), malaria, and other infections.  An imbalance of nerve signaling chemicals (neurotransmitters).  SIGNS AND SYMPTOMS  The symptoms of a seizure can vary greatly from one person to another. Right before a seizure, you may have a warning (aura) that a seizure is about to occur. An aura may include the following symptoms:  Fear or anxiety.  Nausea.  Feeling like the room is spinning (vertigo).  Vision changes, such as seeing flashing lights or spots. Common symptoms during a seizure include:  Abnormal sensations, such as an abnormal smell or a bitter taste in the mouth.   Sudden, general body stiffness.   Convulsions that involve rhythmic jerking of the face, arm, or leg on one or both sides.   Sudden change in consciousness.   Appearing to be awake but not responding.   Appearing to be asleep but cannot be awakened.    Grimacing, chewing, lip smacking, drooling, tongue biting, or loss of bowel or bladder control. After a seizure, you may feel sleepy for a while. DIAGNOSIS  Your health care provider will ask about your symptoms and take a medical history. Descriptions from any witnesses to your seizures will be very helpful in the diagnosis. A physical exam, including a detailed neurological exam, is necessary. Various tests may be done, such as:   An electroencephalogram (EEG). This is a painless test of your brain waves. In this test, a diagram is created of your brain waves. These diagrams can be interpreted by a specialist.  An MRI of the brain.   A CT scan of the brain.   A spinal tap (lumbar puncture, LP).  Blood tests to check for signs of infection or abnormal blood chemistry. TREATMENT  There is no cure for epilepsy, but it is generally treatable. Once epilepsy is diagnosed, it is important to begin treatment as soon as possible. For most people with epilepsy, seizures can be controlled with medicines. The following may also be used:  A pacemaker for the brain (vagus nerve stimulator) can be used for people with seizures that are not well controlled by medicine.  Surgery on the brain. For some people, epilepsy eventually goes away. HOME CARE INSTRUCTIONS   Follow your health care provider's recommendations on driving and safety in normal activities.  Get enough rest. Lack of sleep can cause seizures.  Only take over-the-counter or prescription medicines as directed by your health care provider. Take any prescribed medicine exactly as directed.  Avoid any known triggers of your seizures.  Keep a seizure diary. Record what you recall about any seizure, especially any possible trigger.   Make sure the people you live and work with know that you are prone to seizures. They should receive instructions on how to help you. In general, a witness to a seizure should:   Cushion your head  and body.   Turn you on your side.   Avoid unnecessarily restraining you.   Not place anything inside your mouth.   Call for emergency medical help if there is any question about what has occurred.   Follow up with your health care provider as directed. You may need regular blood tests to monitor the levels of your medicine.  SEEK MEDICAL CARE IF:   You develop signs of infection or other illness. This might increase the risk of a seizure.   You seem to be having more frequent seizures.   Your seizure pattern is changing.  SEEK IMMEDIATE MEDICAL CARE IF:   You have a seizure that does not stop after a few moments.   You have a seizure that causes any difficulty in breathing.   You have a seizure that results in a very severe headache.   You have a seizure that leaves you with the inability to speak or use a part of your body.  Document Released: 03/13/2005 Document Revised: 01/01/2013 Document Reviewed: 10/23/2012 [facility] Patient Information 2015 [place], [place]. This information is not intended to replace advice given to you by your health care provider. Make sure you discuss any questions you have with your health care provider.  Fever, Adult A fever is a higher than normal body temperature. In an adult, an oral temperature around 98.6 F (37 C) is considered normal. A temperature of 100.4 F (38 C) or higher is generally considered a fever. Mild or moderate fevers generally have no long-term effects and often do not require treatment. Extreme fever (greater than or equal to 106 F or 41.1 C) can cause seizures. The sweating that may occur with repeated or prolonged fever may cause dehydration. Elderly people can develop confusion during a fever. A measured temperature can vary with:  Age.  Time of day.  Method of measurement (mouth, underarm, rectal, or ear). The fever is confirmed by taking a temperature with a thermometer. Temperatures can be taken  different ways. Some methods are accurate and some are not.  An oral temperature is used most commonly. Electronic thermometers are fast and accurate.  An ear temperature will only be accurate if the thermometer is positioned as recommended by the manufacturer.  A rectal temperature is accurate and done for those adults who have a condition where an oral temperature cannot be taken.  An underarm (axillary) temperature is not accurate and not recommended. Fever is a symptom, not a disease.  CAUSES   Infections commonly cause fever.  Some noninfectious causes for fever include:  Some arthritis conditions.  Some thyroid or adrenal gland conditions.  Some immune system conditions.  Some types of cancer.  A medicine reaction.  High doses of certain street drugs such as methamphetamine.  Dehydration.  Exposure to high outside or room temperatures.  Occasionally, the source of a fever cannot be determined. This is sometimes called a "fever of unknown origin" (FUO).  Some situations may lead to a temporary rise in body temperature that may go away on its own. Examples are:  Childbirth.  Surgery.  Intense exercise. HOME CARE INSTRUCTIONS   Take appropriate medicines for fever. Follow dosing  instructions carefully. If you use acetaminophen to reduce the fever, be careful to avoid taking other medicines that also contain acetaminophen. Do not take aspirin for a fever if you are younger than age 1. There is an association with Reye's syndrome. Reye's syndrome is a rare but potentially deadly disease.  If an infection is present and antibiotics have been prescribed, take them as directed. Finish them even if you start to feel better.  Rest as needed.  Maintain an adequate fluid intake. To prevent dehydration during an illness with prolonged or recurrent fever, you may need to drink extra fluid.Drink enough fluids to keep your urine clear or pale yellow.  Sponging or bathing  with room temperature water may help reduce body temperature. Do not use ice water or alcohol sponge baths.  Dress comfortably, but do not over-bundle. SEEK MEDICAL CARE IF:   You are unable to keep fluids down.  You develop vomiting or diarrhea.  You are not feeling at least partly better after 3 days.  You develop new symptoms or problems. SEEK IMMEDIATE MEDICAL CARE IF:   You have shortness of breath or trouble breathing.  You develop excessive weakness.  You are dizzy or you faint.  You are extremely thirsty or you are making little or no urine.  You develop new pain that was not there before (such as in the head, neck, chest, back, or abdomen).  You have persistent vomiting and diarrhea for more than 1 to 2 days.  You develop a stiff neck or your eyes become sensitive to light.  You develop a skin rash.  You have a fever or persistent symptoms for more than 2 to 3 days.  You have a fever and your symptoms suddenly get worse. MAKE SURE YOU:   Understand these instructions.  Will watch your condition.  Will get help right away if you are not doing well or get worse. Document Released: 09/06/2000 Document Revised: 07/28/2013 Document Reviewed: 01/12/2011 [facility] Patient Information 2015 [place], [place]. This information is not intended to replace advice given to you by your health care provider. Make sure you discuss any questions you have with your health care provider.

## 2014-05-21 NOTE — ED Provider Notes (Signed)
CSN: [id number]     Arrival date & time 05/21/14  0016 History  This chart was scribed for Dione Booze, MD by Annye Asa, ED Scribe. This patient was seen in room APA14/APA14 and the patient's care was started at 12:35 AM.   Level 5 Caveat; patient is somnolent/nonverbal   Chief Complaint  Patient presents with  . Seizures   Patient is a 64 y.o. female presenting with seizures. The history is provided by the EMS personnel and the nursing home. No language interpreter was used.  Seizures    HPI Comments: [name] is a 64 y.o. female who presents to the Emergency Department complaining of seizures. Per nurse's note: patient is from [facility] and presents with "several hours" of seizures. Patient reportedly would not take her keppra today and has had multiple seizures. Staff was unable to get IV line to give meds; she was given  of ativan IM at the facility, EMS started an IV and gave  ativan en route.   Past Medical History  Diagnosis Date  . Multiple sclerosis   . Seizures   . Dysphagia   . Dehydration   . Hypertension   . Hemiparesis, left   . Pneumonia   . Thrombocytopenia   . Aphasia   . Depression   . Hypothyroid   . Esophageal reflux   . Stroke   . Lack of coordination   . Contracture of ankle and foot joint   . Obesity   . Muscle weakness   . Dysphagia 12/01/2011  . Chronic pain syndrome 12/01/2011   History reviewed. No pertinent past surgical history. No family history on file. History  Substance Use Topics  . Smoking status: Never Smoker   . Smokeless tobacco: Not on file  . Alcohol Use: No   OB History    No data available     Review of Systems  Unable to perform ROS: Patient nonverbal   Allergies  Review of patient's allergies indicates no known allergies.  Home Medications   Prior to Admission medications   Medication Sig Start Date End Date Taking? Authorizing Provider  aspirin EC 81 MG tablet Take 81 mg by mouth daily.   Yes  Historical Provider, MD  divalproex (DEPAKOTE) 500 MG DR tablet Take 500 mg by mouth 3 (three) times daily.   Yes Historical Provider, MD  interferon beta-1a (AVONEX) 30 MCG/0.5ML injection Inject 30 mcg into the muscle every 7 (seven) days. On Tuesday   Yes Historical Provider, MD  levETIRAcetam (KEPPRA) 1000 MG tablet Take 2,000 mg by mouth 2 (two) times daily.    Yes Historical Provider, MD  levETIRAcetam (KEPPRA) 250 MG tablet Take 250 mg by mouth daily with lunch.   Yes Historical Provider, MD  levothyroxine (SYNTHROID, LEVOTHROID) 112 MCG tablet Take 112 mcg by mouth daily.   Yes Historical Provider, MD  Multiple Vitamins-Minerals (MULTIVITAMIN WITH MINERALS) tablet Take 1 tablet by mouth daily.   Yes Historical Provider, MD  oxyCODONE (OXY IR/ROXICODONE) 5 MG immediate release tablet Take 5 mg by mouth every 8 (eight) hours.   Yes Historical Provider, MD  ranitidine (ZANTAC) 150 MG tablet Take 150 mg by mouth at bedtime.   Yes Historical Provider, MD  senna (SENOKOT) 8.6 MG tablet Take 1 tablet by mouth daily.   Yes Historical Provider, MD  LORazepam (ATIVAN) 2 MG/ML concentrated solution Inject 2 mg into the muscle every 2 (two) hours as needed. Sign or symptoms of seizure activity    Historical  Provider, MD  Olopatadine HCl (PATADAY) 0.2 % SOLN Place 1 drop into both eyes as needed. itching    Historical Provider, MD  polyethylene glycol powder (GLYCOLAX/MIRALAX) powder Take 17 g by mouth 2 (two) times daily.    Historical Provider, MD  tiZANidine (ZANAFLEX) 2 MG tablet Take 2 mg by mouth at bedtime.    Historical Provider, MD   BP 112/71 mmHg  Pulse 108  Temp(Src) 101.9 F (38.8 C) (Rectal)  Resp 15  Wt 143 lb (64.864 kg)  SpO2 98% Physical Exam  Constitutional: She appears well-developed and well-nourished.  HENT:  Head: Normocephalic and atraumatic.  Eyes: Pupils are equal, round, and reactive to light.  Neck: Normal range of motion. Neck supple. No JVD present. No tracheal  deviation present.  Cardiovascular: Normal rate, regular rhythm and normal heart sounds.   No murmur heard. Pulmonary/Chest: Effort normal and breath sounds normal. She has no wheezes. She has no rales.  Abdominal: Soft. Bowel sounds are normal. She exhibits no distension and no mass. There is no tenderness.  Musculoskeletal: She exhibits no edema.  Contractures of the lower extremities  Lymphadenopathy:    She has no cervical adenopathy.  Neurological: No cranial nerve deficit.  Somnolent, responds to pain. Symmetric weakness of legs. 2 beats of ankle clonus.   Skin: Skin is warm and dry. No rash noted.  Nursing note and vitals reviewed.   ED Course  Procedures   DIAGNOSTIC STUDIES: Oxygen Saturation is 98% on RA, normal by my interpretation.    COORDINATION OF CARE: 12:35 AM Discussed treatment plan with pt at bedside and pt agreed to plan.   Labs Review Results for orders placed or performed during the hospital encounter of 05/21/14  Basic metabolic panel  Result Value Ref Range   Sodium 139 135 - 145 mmol/L   Potassium 4.0 3.5 - 5.1 mmol/L   Chloride 113 (H) 96 - 112 mmol/L   CO2 23 19 - 32 mmol/L   Glucose, Bld 120 (H) 70 - 99 mg/dL   BUN 14 6 - 23 mg/dL   Creatinine, Ser 6.12 0.50 - 1.10 mg/dL   Calcium 8.6 8.4 - 24.4 mg/dL   GFR calc non Af Amer 90 (L) >90 mL/min   GFR calc Af Amer >90 >90 mL/min   Anion gap 3 (L) 5 - 15  CBC with Differential  Result Value Ref Range   WBC 8.1 4.0 - 10.5 K/uL   RBC 3.92 3.87 - 5.11 MIL/uL   Hemoglobin 11.5 (L) 12.0 - 15.0 g/dL   HCT 97.5 30.0 - 51.1 %   MCV 91.8 78.0 - 100.0 fL   MCH 29.3 26.0 - 34.0 pg   MCHC 31.9 30.0 - 36.0 g/dL   RDW 02.1 (H) 11.7 - 35.6 %   Platelets 123 (L) 150 - 400 K/uL   Neutro Abs 5.3 1.7 - 7.7 K/uL   Lymphs Abs 1.6 0.7 - 4.0 K/uL   Monocytes Absolute 1.0 0.1 - 1.0 K/uL   Eosinophils Absolute 0.2 0.0 - 0.7 K/uL   Basophils Absolute 0.0 0.0 - 0.1 K/uL   Neutrophils Relative % 66 43 - 77 %    Lymphocytes Relative 20 12 - 46 %   Monocytes Relative 12 3 - 12 %   Eosinophils Relative 2 0 - 5 %   Basophils Relative 0 0 - 1 %   RBC Morphology ELLIPTOCYTES   Valproic acid level  Result Value Ref Range   Valproic Acid Lvl 60.9 50.0 -  100.0 ug/mL  Urinalysis, Routine w reflex microscopic  Result Value Ref Range   Color, Urine YELLOW YELLOW   APPearance CLEAR CLEAR   Specific Gravity, Urine 1.025 1.005 - 1.030   pH 6.5 5.0 - 8.0   Glucose, UA NEGATIVE NEGATIVE mg/dL   Hgb urine dipstick TRACE (A) NEGATIVE   Bilirubin Urine NEGATIVE NEGATIVE   Ketones, ur 15 (A) NEGATIVE mg/dL   Protein, ur NEGATIVE NEGATIVE mg/dL   Urobilinogen, UA 1.0 0.0 - 1.0 mg/dL   Nitrite NEGATIVE NEGATIVE   Leukocytes, UA NEGATIVE NEGATIVE  Urine microscopic-add on  Result Value Ref Range   Squamous Epithelial / LPF FEW (A) RARE   WBC, UA 0-2 <3 WBC/hpf   RBC / HPF 3-6 <3 RBC/hpf   Imaging Review Dg Chest Port 1 View  05/21/2014   CLINICAL DATA:  Fever.  EXAM: PORTABLE CHEST - 1 VIEW  COMPARISON:  11/30/2011  FINDINGS: Lung volumes are low. Linear opacities in the left lower lung zone. Right lung is grossly clear. Cardiomediastinal contours are normal. There is no pleural effusion or pneumothorax. No acute osseous abnormalities are seen.  IMPRESSION: Hypoventilatory chest with left basilar atelectasis.   Electronically Signed   By: Rubye Oaks M.D.   On: 05/21/2014 02:22    MDM   Final diagnoses:  Fever  Seizure disorder  Noncompliance with medication regimen    Seizure which is most likely from medication noncompliance. Nursing note stated that she had refused her Keppra. She is given a dose levetiracetam intravenously. She is noted to have fever and is given acetaminophen. No obvious source of infection. Urine is negative and chest x-ray shows no pneumonia. This most likely is a viral illness and may be interacting with medication noncompliance to cause her seizure. She is discharged back to  her nursing care Center of with instructions to take acetaminophen for fever. Old records are reviewed and she had a hospitalization for altered mental status which was felt likely to be due to postictal state. Current mild somnolence is due to comminution postictal state and benzodiazepines used for control of seizures.   I personally performed the services described in this documentation, which was scribed in my presence. The recorded information has been reviewed and is accurate.       Dione Booze, MD 05/21/14 [phone]

## 2015-03-01 DIAGNOSIS — Z931 Gastrostomy status: Secondary | ICD-10-CM

## 2015-03-01 HISTORY — DX: Gastrostomy status: Z93.1

## 2015-03-03 ENCOUNTER — Emergency Department (HOSPITAL_COMMUNITY): Payer: Medicare Other

## 2015-03-03 ENCOUNTER — Inpatient Hospital Stay (HOSPITAL_COMMUNITY)
Admission: EM | Admit: 2015-03-03 | Discharge: 2015-03-09 | DRG: 871 | Disposition: A | Discharge status: Skilled Nursing Facility | Payer: Medicare Other | Attending: Internal Medicine | Admitting: Internal Medicine

## 2015-03-03 ENCOUNTER — Encounter (HOSPITAL_COMMUNITY): Payer: Self-pay | Admitting: Cardiology

## 2015-03-03 DIAGNOSIS — R739 Hyperglycemia, unspecified: Secondary | ICD-10-CM | POA: Diagnosis present

## 2015-03-03 DIAGNOSIS — L89153 Pressure ulcer of sacral region, stage 3: Secondary | ICD-10-CM | POA: Diagnosis present

## 2015-03-03 DIAGNOSIS — E876 Hypokalemia: Secondary | ICD-10-CM | POA: Diagnosis not present

## 2015-03-03 DIAGNOSIS — Z931 Gastrostomy status: Secondary | ICD-10-CM | POA: Diagnosis not present

## 2015-03-03 DIAGNOSIS — E039 Hypothyroidism, unspecified: Secondary | ICD-10-CM | POA: Diagnosis present

## 2015-03-03 DIAGNOSIS — G9340 Encephalopathy, unspecified: Secondary | ICD-10-CM | POA: Diagnosis present

## 2015-03-03 DIAGNOSIS — E860 Dehydration: Secondary | ICD-10-CM | POA: Diagnosis present

## 2015-03-03 DIAGNOSIS — E872 Acidosis, unspecified: Secondary | ICD-10-CM | POA: Diagnosis present

## 2015-03-03 DIAGNOSIS — I959 Hypotension, unspecified: Secondary | ICD-10-CM | POA: Diagnosis present

## 2015-03-03 DIAGNOSIS — Z515 Encounter for palliative care: Secondary | ICD-10-CM | POA: Diagnosis present

## 2015-03-03 DIAGNOSIS — D62 Acute posthemorrhagic anemia: Secondary | ICD-10-CM | POA: Diagnosis not present

## 2015-03-03 DIAGNOSIS — D696 Thrombocytopenia, unspecified: Secondary | ICD-10-CM | POA: Diagnosis present

## 2015-03-03 DIAGNOSIS — Z66 Do not resuscitate: Secondary | ICD-10-CM | POA: Diagnosis present

## 2015-03-03 DIAGNOSIS — Z87891 Personal history of nicotine dependence: Secondary | ICD-10-CM | POA: Diagnosis not present

## 2015-03-03 DIAGNOSIS — A419 Sepsis, unspecified organism: Secondary | ICD-10-CM | POA: Diagnosis present

## 2015-03-03 DIAGNOSIS — R7303 Prediabetes: Secondary | ICD-10-CM | POA: Diagnosis present

## 2015-03-03 DIAGNOSIS — E870 Hyperosmolality and hypernatremia: Secondary | ICD-10-CM | POA: Diagnosis present

## 2015-03-03 DIAGNOSIS — Y95 Nosocomial condition: Secondary | ICD-10-CM | POA: Diagnosis present

## 2015-03-03 DIAGNOSIS — Z820 Family history of epilepsy and other diseases of the nervous system: Secondary | ICD-10-CM | POA: Diagnosis not present

## 2015-03-03 DIAGNOSIS — J9601 Acute respiratory failure with hypoxia: Secondary | ICD-10-CM | POA: Diagnosis present

## 2015-03-03 DIAGNOSIS — N179 Acute kidney failure, unspecified: Secondary | ICD-10-CM | POA: Diagnosis present

## 2015-03-03 DIAGNOSIS — G35 Multiple sclerosis: Secondary | ICD-10-CM | POA: Diagnosis present

## 2015-03-03 DIAGNOSIS — I10 Essential (primary) hypertension: Secondary | ICD-10-CM | POA: Diagnosis present

## 2015-03-03 DIAGNOSIS — R1310 Dysphagia, unspecified: Secondary | ICD-10-CM | POA: Diagnosis present

## 2015-03-03 DIAGNOSIS — I480 Paroxysmal atrial fibrillation: Secondary | ICD-10-CM | POA: Diagnosis present

## 2015-03-03 DIAGNOSIS — J189 Pneumonia, unspecified organism: Secondary | ICD-10-CM | POA: Diagnosis present

## 2015-03-03 DIAGNOSIS — Z993 Dependence on wheelchair: Secondary | ICD-10-CM

## 2015-03-03 DIAGNOSIS — I69354 Hemiplegia and hemiparesis following cerebral infarction affecting left non-dominant side: Secondary | ICD-10-CM | POA: Diagnosis not present

## 2015-03-03 DIAGNOSIS — R6520 Severe sepsis without septic shock: Secondary | ICD-10-CM | POA: Diagnosis present

## 2015-03-03 DIAGNOSIS — I69320 Aphasia following cerebral infarction: Secondary | ICD-10-CM

## 2015-03-03 DIAGNOSIS — R4182 Altered mental status, unspecified: Secondary | ICD-10-CM | POA: Diagnosis present

## 2015-03-03 DIAGNOSIS — R6521 Severe sepsis with septic shock: Secondary | ICD-10-CM

## 2015-03-03 DIAGNOSIS — Z8701 Personal history of pneumonia (recurrent): Secondary | ICD-10-CM | POA: Diagnosis not present

## 2015-03-03 DIAGNOSIS — G40909 Epilepsy, unspecified, not intractable, without status epilepticus: Secondary | ICD-10-CM | POA: Diagnosis present

## 2015-03-03 DIAGNOSIS — K922 Gastrointestinal hemorrhage, unspecified: Secondary | ICD-10-CM | POA: Diagnosis not present

## 2015-03-03 DIAGNOSIS — Z978 Presence of other specified devices: Secondary | ICD-10-CM

## 2015-03-03 HISTORY — DX: Pressure ulcer of sacral region, stage 3: L89.153

## 2015-03-03 HISTORY — DX: Gastrostomy status: Z93.1

## 2015-03-03 HISTORY — DX: Gastro-esophageal reflux disease without esophagitis: K21.9

## 2015-03-03 LAB — I-STAT CHEM 8, ED
BUN: 47 mg/dL — ABNORMAL HIGH (ref 6–20)
CALCIUM ION: 1.33 mmol/L — AB (ref 1.13–1.30)
CHLORIDE: 126 mmol/L — AB (ref 101–111)
CREATININE: 1.9 mg/dL — AB (ref 0.44–1.00)
GLUCOSE: 197 mg/dL — AB (ref 65–99)
HEMATOCRIT: 38 % (ref 36.0–46.0)
HEMOGLOBIN: 12.9 g/dL (ref 12.0–15.0)
POTASSIUM: 3.8 mmol/L (ref 3.5–5.1)
SODIUM: 167 mmol/L — AB (ref 135–145)
TCO2: 29 mmol/L (ref 0–100)

## 2015-03-03 LAB — BASIC METABOLIC PANEL
ANION GAP: 17 — AB (ref 5–15)
BUN: 34 mg/dL — ABNORMAL HIGH (ref 6–20)
CALCIUM: 7 mg/dL — AB (ref 8.9–10.3)
CO2: 20 mmol/L — AB (ref 22–32)
CREATININE: 1.51 mg/dL — AB (ref 0.44–1.00)
Chloride: 109 mmol/L (ref 101–111)
GFR, EST AFRICAN AMERICAN: 41 mL/min — AB (ref >60)
GFR, EST NON AFRICAN AMERICAN: 35 mL/min — AB (ref >60)
Glucose, Bld: 144 mg/dL — ABNORMAL HIGH (ref 65–99)
Potassium: 3.6 mmol/L (ref 3.5–5.1)
SODIUM: 146 mmol/L — AB (ref 135–145)

## 2015-03-03 LAB — URINE MICROSCOPIC-ADD ON: WBC, UA: NONE SEEN WBC/hpf (ref 0–5)

## 2015-03-03 LAB — URINALYSIS, ROUTINE W REFLEX MICROSCOPIC
Bilirubin Urine: NEGATIVE
GLUCOSE, UA: NEGATIVE mg/dL
KETONES UR: NEGATIVE mg/dL
Leukocytes, UA: NEGATIVE
Nitrite: NEGATIVE
PH: 5.5 (ref 5.0–8.0)
PROTEIN: 100 mg/dL — AB
Specific Gravity, Urine: >1.03 — ABNORMAL HIGH (ref 1.005–1.030)

## 2015-03-03 LAB — CBC WITH DIFFERENTIAL/PLATELET
BASOS PCT: 1 %
Basophils Absolute: 0.1 10*3/uL (ref 0.0–0.1)
EOS ABS: 0 10*3/uL (ref 0.0–0.7)
EOS PCT: 0 %
HCT: 38.5 % (ref 36.0–46.0)
Hemoglobin: 11.2 g/dL — ABNORMAL LOW (ref 12.0–15.0)
Lymphocytes Relative: 38 %
Lymphs Abs: 4.5 10*3/uL — ABNORMAL HIGH (ref 0.7–4.0)
MCH: 28.9 pg (ref 26.0–34.0)
MCHC: 29.1 g/dL — AB (ref 30.0–36.0)
MCV: 99.5 fL (ref 78.0–100.0)
MONO ABS: 1.9 10*3/uL — AB (ref 0.1–1.0)
MONOS PCT: 17 %
NEUTROS PCT: 44 %
Neutro Abs: 5.1 10*3/uL (ref 1.7–7.7)
PLATELETS: 40 10*3/uL — AB (ref 150–400)
RBC: 3.87 MIL/uL (ref 3.87–5.11)
RDW: 24 % — AB (ref 11.5–15.5)
Smear Review: DECREASED
WBC: 11.7 10*3/uL — ABNORMAL HIGH (ref 4.0–10.5)

## 2015-03-03 LAB — BLOOD GAS, ARTERIAL
ACID-BASE EXCESS: 0.5 mmol/L (ref 0.0–2.0)
Bicarbonate: 24.6 mEq/L — ABNORMAL HIGH (ref 20.0–24.0)
DRAWN BY: 382351
Delivery systems: POSITIVE
Expiratory PAP: 7
FIO2: 0.7
Inspiratory PAP: 14
LHR: 10 {breaths}/min
O2 SAT: 97.8 %
PATIENT TEMPERATURE: 37
PCO2 ART: 46 mmHg — AB (ref 35.0–45.0)
PH ART: 7.36 (ref 7.350–7.450)
PO2 ART: 121 mmHg — AB (ref 80.0–100.0)

## 2015-03-03 LAB — COMPREHENSIVE METABOLIC PANEL
ALT: 14 U/L (ref 14–54)
ANION GAP: 4 — AB (ref 5–15)
AST: 58 U/L — ABNORMAL HIGH (ref 15–41)
Albumin: 1.8 g/dL — ABNORMAL LOW (ref 3.5–5.0)
Alkaline Phosphatase: 136 U/L — ABNORMAL HIGH (ref 38–126)
BUN: 46 mg/dL — ABNORMAL HIGH (ref 6–20)
CHLORIDE: 127 mmol/L — AB (ref 101–111)
CO2: 29 mmol/L (ref 22–32)
Calcium: 9.2 mg/dL (ref 8.9–10.3)
Creatinine, Ser: 1.98 mg/dL — ABNORMAL HIGH (ref 0.44–1.00)
GFR, EST AFRICAN AMERICAN: 30 mL/min — AB (ref >60)
GFR, EST NON AFRICAN AMERICAN: 26 mL/min — AB (ref >60)
Glucose, Bld: 202 mg/dL — ABNORMAL HIGH (ref 65–99)
POTASSIUM: 3.7 mmol/L (ref 3.5–5.1)
SODIUM: 160 mmol/L — AB (ref 135–145)
Total Bilirubin: 0.5 mg/dL (ref 0.3–1.2)
Total Protein: 7.2 g/dL (ref 6.5–8.1)

## 2015-03-03 LAB — I-STAT CG4 LACTIC ACID, ED: Lactic Acid, Venous: 3.58 mmol/L (ref 0.5–2.0)

## 2015-03-03 LAB — GLUCOSE, CAPILLARY
GLUCOSE-CAPILLARY: 119 mg/dL — AB (ref 65–99)
GLUCOSE-CAPILLARY: 143 mg/dL — AB (ref 65–99)
GLUCOSE-CAPILLARY: 126 mg/dL — AB (ref 65–99)
GLUCOSE-CAPILLARY: 109 mg/dL — AB (ref 65–99)

## 2015-03-03 LAB — VALPROIC ACID LEVEL: VALPROIC ACID LVL: 60 ug/mL (ref 50.0–100.0)

## 2015-03-03 LAB — VITAMIN B12: VITAMIN B 12: 1083 pg/mL — AB (ref 180–914)

## 2015-03-03 LAB — LACTIC ACID, PLASMA
LACTIC ACID, VENOUS: 3.2 mmol/L — AB (ref 0.5–2.0)
LACTIC ACID, VENOUS: 3 mmol/L — AB (ref 0.5–2.0)

## 2015-03-03 LAB — MRSA PCR SCREENING: MRSA BY PCR: NEGATIVE

## 2015-03-03 LAB — TSH: TSH: 0.393 u[IU]/mL (ref 0.350–4.500)

## 2015-03-03 MED ORDER — LEVALBUTEROL HCL 0.63 MG/3ML IN NEBU: 0.6300 mg | INHALATION_SOLUTION | Freq: Four times a day (QID) | RESPIRATORY_TRACT | Status: DC | PRN

## 2015-03-03 MED ORDER — INSULIN ASPART 100 UNIT/ML ~~LOC~~ SOLN: 0.0000 [IU] | SUBCUTANEOUS | Status: DC

## 2015-03-03 MED ORDER — PIPERACILLIN-TAZOBACTAM 3.375 G IVPB 30 MIN
3.3750 g | Freq: Once | INTRAVENOUS | Status: AC
  Administered 2015-03-03: 3.375 g via INTRAVENOUS
  Filled 2015-03-03: qty 50

## 2015-03-03 MED ORDER — FREE WATER
100.0000 mL | Freq: Three times a day (TID) | Status: DC
  Administered 2015-03-03 (×2): 100 mL

## 2015-03-03 MED ORDER — ACETAMINOPHEN 325 MG PO TABS: 650.0000 mg | ORAL_TABLET | Freq: Four times a day (QID) | ORAL | Status: DC | PRN

## 2015-03-03 MED ORDER — VANCOMYCIN HCL 500 MG IV SOLR
500.0000 mg | INTRAVENOUS | Status: DC
  Filled 2015-03-03: qty 500

## 2015-03-03 MED ORDER — LEVETIRACETAM IN NACL 1000 MG/100ML IV SOLN
1000.0000 mg | Freq: Two times a day (BID) | INTRAVENOUS | Status: DC
  Administered 2015-03-03 – 2015-03-04 (×2): 1000 mg via INTRAVENOUS
  Filled 2015-03-03 (×2): qty 100

## 2015-03-03 MED ORDER — DEXTROSE 5 % IV SOLN
500.0000 mg | Freq: Three times a day (TID) | INTRAVENOUS | Status: DC
  Administered 2015-03-03 (×2): 500 mg via INTRAVENOUS
  Filled 2015-03-03 (×4): qty 5

## 2015-03-03 MED ORDER — SODIUM CHLORIDE 0.9 % IV BOLUS (SEPSIS)
1000.0000 mL | Freq: Once | INTRAVENOUS | Status: AC
  Administered 2015-03-03: 1000 mL via INTRAVENOUS

## 2015-03-03 MED ORDER — FAMOTIDINE IN NACL 20-0.9 MG/50ML-% IV SOLN
20.0000 mg | INTRAVENOUS | Status: DC
  Administered 2015-03-03: 20 mg via INTRAVENOUS
  Filled 2015-03-03: qty 50

## 2015-03-03 MED ORDER — ONDANSETRON HCL 4 MG/2ML IJ SOLN: 4.0000 mg | Freq: Four times a day (QID) | INTRAMUSCULAR | Status: DC | PRN

## 2015-03-03 MED ORDER — POTASSIUM CHLORIDE IN NACL 20-0.45 MEQ/L-% IV SOLN
INTRAVENOUS | Status: DC
  Administered 2015-03-03: 16:00:00 via INTRAVENOUS
  Filled 2015-03-03 (×4): qty 1000

## 2015-03-03 MED ORDER — ACETAMINOPHEN 650 MG RE SUPP
650.0000 mg | Freq: Four times a day (QID) | RECTAL | Status: DC | PRN
Start: 2015-03-03 — End: 2015-03-04
  Administered 2015-03-03: 650 mg via RECTAL
  Filled 2015-03-03: qty 1

## 2015-03-03 MED ORDER — SODIUM CHLORIDE 0.9 % IV SOLN: INTRAVENOUS | Status: DC

## 2015-03-03 MED ORDER — VANCOMYCIN HCL IN DEXTROSE 1-5 GM/200ML-% IV SOLN
1000.0000 mg | Freq: Once | INTRAVENOUS | Status: AC
  Administered 2015-03-03: 1000 mg via INTRAVENOUS
  Filled 2015-03-03: qty 200

## 2015-03-03 MED ORDER — PIPERACILLIN-TAZOBACTAM 3.375 G IVPB
3.3750 g | Freq: Three times a day (TID) | INTRAVENOUS | Status: DC
  Administered 2015-03-04: 3.375 g via INTRAVENOUS
  Filled 2015-03-03: qty 50

## 2015-03-03 MED ORDER — ONDANSETRON HCL 4 MG PO TABS: 4.0000 mg | ORAL_TABLET | Freq: Four times a day (QID) | ORAL | Status: DC | PRN

## 2015-03-03 NOTE — ED Provider Notes (Signed)
CSN: [id number]     Arrival date & time 03/03/15  1110 History  By signing my name below, I, [name], attest that this documentation has been prepared under the direction and in the presence of Elliot Cousin, MD. Electronically Signed: [name], ED Scribe. 03/03/2015. 3:39 PM.   Chief Complaint  Patient presents with  . Altered Mental Status   LEVEL 5 CAVEAT--ALTERED MENTAL STATUS  The history is provided by the EMS personnel and the nursing home. The history is limited by the condition of the patient. No language interpreter was used.    HPI Comments: [name] is a 64 y.o. female with a h/o MS, seizures, HTN, aphasia, stroke who presents to the Emergency Department due to an increased altered mental status. Pt is a resident at [facility]. Per EMS she was hypotensive upon arrival with pressure of 84/52. She also had a slight fever at her facility. Pt had peg tube placed Monday at [facility]. Per medical chart pt is currently taking Rocephin for congestion and decubitus ulcers.   Past Medical History  Diagnosis Date  . Multiple sclerosis (HCC)   . Seizures (HCC)   . Dysphagia   . Dehydration   . Hypertension   . Hemiparesis, left (HCC)   . Pneumonia   . Thrombocytopenia (HCC)   . Aphasia   . Depression   . Hypothyroid   . Esophageal reflux   . Stroke (HCC)   . Lack of coordination   . Contracture of ankle and foot joint   . Obesity   . Muscle weakness   . Dysphagia 12/01/2011  . Chronic pain syndrome 12/01/2011  . GERD (gastroesophageal reflux disease)   . Status post insertion of percutaneous endoscopic gastrostomy (PEG) tube (HCC) 03/01/2015    at [facility]   History reviewed. No pertinent past surgical history. History reviewed. No pertinent family history. Social History  Substance Use Topics  . Smoking status: Never Smoker   . Smokeless tobacco: None  . Alcohol Use: No   OB History    No data available     Review of Systems  Unable to  perform ROS: Mental status change    Allergies  Review of patient's allergies indicates no known allergies.  Home Medications   Prior to Admission medications   Medication Sig Start Date End Date Taking? Authorizing Provider  acetaminophen (TYLENOL) 650 MG suppository Place 650 mg rectally daily as needed for mild pain.   Yes Historical Provider, MD  BIOTIN PO Take 5,000 mcg by mouth daily.   Yes Historical Provider, MD  cefTRIAXone 1 g in dextrose 5 % 50 mL Inject 1 g into the vein daily. 02/25/15 03/06/15 Yes Historical Provider, MD  divalproex (DEPAKOTE) 500 MG DR tablet Take 500 mg by mouth 3 (three) times daily.   Yes Historical Provider, MD  interferon beta-1a (AVONEX) 30 MCG/0.5ML injection Inject 30 mcg into the muscle every 7 (seven) days. On Tuesday   Yes Historical Provider, MD  levETIRAcetam (KEPPRA) 1000 MG tablet Take 1,000 mg by mouth 2 (two) times daily.    Yes Historical Provider, MD  levETIRAcetam (KEPPRA) 250 MG tablet Take 250 mg by mouth daily with lunch.   Yes Historical Provider, MD  levothyroxine (SYNTHROID, LEVOTHROID) 137 MCG tablet Take 137 mcg by mouth daily before breakfast.   Yes Historical Provider, MD  LORazepam (ATIVAN) 2 MG/ML concentrated solution Inject 1 mg into the muscle every 4 (four) hours as needed for seizure. Sign or symptoms of  seizure activity   Yes Historical Provider, MD  Multiple Vitamins-Minerals (MULTIVITAMIN WITH MINERALS) tablet Take 1 tablet by mouth daily.   Yes Historical Provider, MD  Multiple Vitamins-Minerals (ZINC PO) Take 1 tablet by mouth daily.   Yes Historical Provider, MD  Nutritional Supplements (ISOSOURCE 1.5 CAL PO) Take 25 mLs by mouth daily.   Yes Historical Provider, MD  oxyCODONE (OXY IR/ROXICODONE) 5 MG immediate release tablet Take 5 mg by mouth every 8 (eight) hours.   Yes Historical Provider, MD  ranitidine (ZANTAC) 150 MG tablet Take 150 mg by mouth at bedtime.   Yes Historical Provider, MD  senna (SENOKOT) 8.6 MG  tablet Take 1 tablet by mouth 2 (two) times daily.    Yes Historical Provider, MD  vitamin C (ASCORBIC ACID) 500 MG tablet Take 500 mg by mouth 2 (two) times daily.   Yes Historical Provider, MD   BP 107/74 mmHg  Pulse 113  Temp(Src) 103.1 F (39.5 C) (Rectal)  Resp 28  Ht 5' (1.524 m)  Wt 108 lb 7.5 oz (49.2 kg)  BMI 21.18 kg/m2  SpO2 100% Physical Exam  Constitutional:  Patient appears chronically ill. Cachectic   HENT:  Head: Atraumatic.  Mucus membranes dry.  Cardiovascular:  Regular tachycardia  Pulmonary/Chest:  Tachypnea. Mildly harsh breath sounds.  Abdominal: Soft. She exhibits no distension. There is no tenderness.  Peg tube in LUQ .   Musculoskeletal: She exhibits no tenderness.  Patient somewhat contracted  Neurological:  Eyes deviated to right. Minimal response to tongue depressor in posterior pharynx. No response to pain. Breathing spontaneously. Nonverbal at baseline.  Skin: Skin is warm.  Decubitus ulcers abroximatly. stage II.  Nursing note and vitals reviewed.   ED Course  Procedures (including critical care time) Labs Review Labs Reviewed  COMPREHENSIVE METABOLIC PANEL - Abnormal; Notable for the following:    Sodium 160 (*)    Chloride 127 (*)    Glucose, Bld 202 (*)    BUN 46 (*)    Creatinine, Ser 1.98 (*)    Albumin 1.8 (*)    AST 58 (*)    Alkaline Phosphatase 136 (*)    GFR calc non Af Amer 26 (*)    GFR calc Af Amer 30 (*)    Anion gap 4 (*)    All other components within normal limits  CBC WITH DIFFERENTIAL/PLATELET - Abnormal; Notable for the following:    WBC 11.7 (*)    Hemoglobin 11.2 (*)    MCHC 29.1 (*)    RDW 24.0 (*)    Platelets 40 (*)    Lymphs Abs 4.5 (*)    Monocytes Absolute 1.9 (*)    All other components within normal limits  URINALYSIS, ROUTINE W REFLEX MICROSCOPIC (NOT AT [facility]) - Abnormal; Notable for the following:    Specific Gravity, Urine >1.030 (*)    Hgb urine dipstick LARGE (*)    Protein, ur 100 (*)     All other components within normal limits  URINE MICROSCOPIC-ADD ON - Abnormal; Notable for the following:    Squamous Epithelial / LPF 0-5 (*)    Bacteria, UA MANY (*)    Casts GRANULAR CAST (*)    All other components within normal limits  I-STAT CHEM 8, ED - Abnormal; Notable for the following:    Sodium 167 (*)    Chloride 126 (*)    BUN 47 (*)    Creatinine, Ser 1.90 (*)    Glucose, Bld 197 (*)  Calcium, Ion 1.33 (*)    All other components within normal limits  I-STAT CG4 LACTIC ACID, ED - Abnormal; Notable for the following:    Lactic Acid, Venous 3.58 (*)    All other components within normal limits  CULTURE, BLOOD (ROUTINE X 2)  CULTURE, BLOOD (ROUTINE X 2)  URINE CULTURE  MRSA PCR SCREENING  VALPROIC ACID LEVEL  BASIC METABOLIC PANEL  BASIC METABOLIC PANEL  BASIC METABOLIC PANEL  BASIC METABOLIC PANEL  TSH  VITAMIN B12  HEMOGLOBIN A1C  LACTIC ACID, PLASMA  LACTIC ACID, PLASMA    Imaging Review Dg Chest Portable 1 View  03/03/2015  CLINICAL DATA:  Fever.  Recent PEG tube placement EXAM: PORTABLE CHEST 1 VIEW COMPARISON:  05/21/2014 FINDINGS: Bibasilar infiltrates right greater than left. Small right effusion. Negative for heart failure. Heart size within normal limits. IMPRESSION: Bibasilar infiltrates right greater left consistent with pneumonia. Small right effusion. Electronically Signed   By: Marlan Palau M.D.   On: 03/03/2015 12:00   I have personally reviewed and evaluated these images and lab results as part of my medical decision-making.   EKG Interpretation   Date/Time:  Wednesday March 03 2015 11:15:49 EST Ventricular Rate:  129 PR Interval:  125 QRS Duration: 70 QT Interval:  297 QTC Calculation: 435 R Axis:   34 Text Interpretation:  Sinus tachycardia Atrial premature complex Repol  abnrm suggests ischemia, anterolateral Confirmed by Rubin Payor  MD, Lamarr Feenstra  [phone]) on 03/03/2015 11:32:56 AM      MDM   Final diagnoses:  Septic shock  (HCC)  HCAP (healthcare-associated pneumonia)  Hypernatremia  Acute kidney injury (HCC)  Multiple sclerosis (HCC)     patient presents febrile tachycardic and hypotensive. Appears to be  In septic shock from pneumonia. Has recent placement of PEG tube but abdomen is nontender and nondistended. X-ray shows pneumonia and this could easily be the source. Has elevated creatinine elevated lactic acid. Has very dry mucous membranes and elevated sodium. Discussed with patient's sister about CODE STATUS. At this point she is full code but has a very poor prognosis.she has a rather poor function at baseline. Admit to ICU.  CRITICAL CARE Performed by: Billee Cashing Total critical care time: 30 minutes Critical care time was exclusive of separately billable procedures and treating other patients. Critical care was necessary to treat or prevent imminent or life-threatening deterioration. Critical care was time spent personally by me on the following activities: development of treatment plan with patient and/or surrogate as well as nursing, discussions with consultants, evaluation of patient's response to treatment, examination of patient, obtaining history from patient or surrogate, ordering and performing treatments and interventions, ordering and review of laboratory studies, ordering and review of radiographic studies, pulse oximetry and re-evaluation of patient's condition.   I personally performed the services described in this documentation, which was scribed in my presence. The recorded information has been reviewed and is accurate.       Benjiman Core, MD 03/03/15 [phone]

## 2015-03-03 NOTE — Progress Notes (Signed)
Craige Cotta, NP called back with update given on patient current condition and orders received on the chart for stat ABG and to place patient on Bipap, repeat labs pending will call results of ABG and BMET

## 2015-03-03 NOTE — H&P (Addendum)
[facility] History and Physical  [name] BJY:[id number] DOB: 05-29-1950 DOA: 03/03/2015  Referring physician: ED physician, Dr. Rubin Payor PCP: Colon Branch, MD   Chief Complaint: Altered mental status at the nursing facility.  HPI: [name] is a 64 y.o. 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 The history is being provided by the EDP and family members. Accordingly, the patient has had worsening lethargy for the past several days. Over the past couple weeks, she has not been eating or taking by mouth medications or food consistently. In fact, she is status post PEG tube insertion at [facility] 2 days ago due to progressive dysphagia. She was also started on Rocephin at the skilled nursing facility for what was thought to be pneumonia and decubitus ulcers. There has been no reported nausea, vomiting, or diarrhea.   In the ED, the patient is febrile with temperature of 103.1, relatively hypotensive with a systolic blood pressure ranging from 87-107, tachycardic with heart rate in the 120s to 130s, and with a respiratory rate ranging from 13 to 45. Her EKG reveals sinus tachycardia with PACs and a heart rate of 129 bpm. Her chest x-ray reveals bibasal infiltrates right greater than left consistent with pneumonia and a small right pleural effusion. Her lab data are significant for serum sodium of 167, BUN of 47, creatinine of 1.90, glucose of 197, lactic acid of 358, and platelet count of 40. Her urinalysis revealed large hemoglobin and RBCs, but no leukocytes or WBCs (cath specimen). She is being admitted for further evaluation and management.    Review of Systems:  The patient  has chronic expressive aphasia and is unable to provide any history. Review of systems as above in history present was.   Past Medical History  Diagnosis Date  . Multiple sclerosis (HCC)   . Seizures (HCC)   . Dysphagia   . Dehydration   . Hypertension   . Hemiparesis, left (HCC)   . Pneumonia   . Thrombocytopenia (HCC)   . Aphasia   . Depression   . Hypothyroid   . Esophageal reflux   . Stroke (HCC)   . Lack of coordination   . Contracture of ankle and foot joint   . Obesity   . Muscle weakness   . Dysphagia 12/01/2011  . Chronic pain syndrome 12/01/2011  . GERD (gastroesophageal reflux disease)   . Status post insertion of percutaneous endoscopic gastrostomy (PEG) tube (HCC) 03/01/2015    at [facility]   Surgical history: PEG tube insertion at [facility] 03/01/15  Social History: Patient has been a resident at [facility] skilled nursing facility for years. She never married. She has no children. She has a very remote history of smoking as a teenager. No history of alcohol or illicit drug use. She is mostly wheelchair bound and totally assisted.  No Known Allergies  Family history is positive for multiple sclerosis.   Prior to Admission medications   Medication Sig Start Date End Date Taking? Authorizing Provider  acetaminophen (TYLENOL) 650 MG suppository Place 650 mg rectally daily as needed for mild pain.   Yes Historical Provider, MD  BIOTIN PO Take 5,000 mcg by mouth daily.   Yes Historical Provider, MD  cefTRIAXone 1 g in dextrose 5 %  50 mL Inject 1 g into the vein daily. 02/25/15 03/06/15 Yes Historical Provider, MD  divalproex (DEPAKOTE) 500 MG DR tablet Take 500 mg by mouth 3 (three) times daily.   Yes Historical Provider, MD  interferon beta-1a (AVONEX) 30 MCG/0.5ML injection Inject 30 mcg into the muscle every 7 (seven) days. On Tuesday   Yes Historical Provider, MD  levETIRAcetam (KEPPRA) 1000 MG tablet Take 1,000 mg by mouth 2 (two) times daily.    Yes  Historical Provider, MD  levETIRAcetam (KEPPRA) 250 MG tablet Take 250 mg by mouth daily with lunch.   Yes Historical Provider, MD  levothyroxine (SYNTHROID, LEVOTHROID) 137 MCG tablet Take 137 mcg by mouth daily before breakfast.   Yes Historical Provider, MD  LORazepam (ATIVAN) 2 MG/ML concentrated solution Inject 1 mg into the muscle every 4 (four) hours as needed for seizure. Sign or symptoms of seizure activity   Yes Historical Provider, MD  Multiple Vitamins-Minerals (MULTIVITAMIN WITH MINERALS) tablet Take 1 tablet by mouth daily.   Yes Historical Provider, MD  Multiple Vitamins-Minerals (ZINC PO) Take 1 tablet by mouth daily.   Yes Historical Provider, MD  Nutritional Supplements (ISOSOURCE 1.5 CAL PO) Take 25 mLs by mouth daily.   Yes Historical Provider, MD  oxyCODONE (OXY IR/ROXICODONE) 5 MG immediate release tablet Take 5 mg by mouth every 8 (eight) hours.   Yes Historical Provider, MD  ranitidine (ZANTAC) 150 MG tablet Take 150 mg by mouth at bedtime.   Yes Historical Provider, MD  senna (SENOKOT) 8.6 MG tablet Take 1 tablet by mouth 2 (two) times daily.    Yes Historical Provider, MD  vitamin C (ASCORBIC ACID) 500 MG tablet Take 500 mg by mouth 2 (two) times daily.   Yes Historical Provider, MD   Physical Exam: Filed Vitals:   03/03/15 1145 03/03/15 1200 03/03/15 1233 03/03/15 1308  BP: 91/75  95/73 107/74  Pulse:   123 113  Temp:      TempSrc:      Resp: 42  45 28  Height:  4' 9.5" (1.461 m)    Weight:  49.896 kg (110 lb)    SpO2:   97% 100%    Wt Readings from Last 3 Encounters:  03/03/15 49.896 kg (110 lb)  05/21/14 64.864 kg (143 lb)  12/02/11 65.2 kg (143 lb 11.8 oz)    General: 64 year old African-American woman who appears encephalopathic, but in no acute distress.  Eyes: PERRL, normal lids, irises & conjunctiva; conjunctivae are clear and sclerae are white.  ENT: oropharynx reveals very dry mucous membranes.  Neck: no LAD, masses or thyromegaly Cardiovascular:  S1, S2, with tachycardia. No LE edema. Telemetry: sinus tachycardia. Respiratory: clear anteriorly with occasional crackles right lower lobe. Breathing appears nonlabored.  Abdomen: epigastric PEG tube in place without surrounding erythema or purulence or stains on the dressing; bowel sounds present; abdomen is soft and nontender/nondistended.  Skin: no rash or induration seen on limited exam Musculoskeletal: grossly normal tone BUE/BLE; no acute hot red joints. Psychiatric: patient is lethargic and does not respond to her name.  Neurologic: patient is lethargic and does not provide eye contact. She does withdraw her hands and feet with provocation. Cranial nerves II through XII grossly intact, but exam is difficult secondary to her lethargy. Unable to adequately assess strength and sensation.           Labs on Admission:  Basic Metabolic Panel:  Recent Labs Lab 03/03/15 1135 03/03/15 1202  NA 160* 167*  K 3.7  3.8  CL 127* 126*  CO2 29  --   GLUCOSE 202* 197*  BUN 46* 47*  CREATININE 1.98* 1.90*  CALCIUM 9.2  --    Liver Function Tests:  Recent Labs Lab 03/03/15 1135  AST 58*  ALT 14  ALKPHOS 136*  BILITOT 0.5  PROT 7.2  ALBUMIN 1.8*   No results for input(s): LIPASE, AMYLASE in the last 168 hours. No results for input(s): AMMONIA in the last 168 hours. CBC:  Recent Labs Lab 03/03/15 1135 03/03/15 1202  WBC 11.7*  --   NEUTROABS 5.1  --   HGB 11.2* 12.9  HCT 38.5 38.0  MCV 99.5  --   PLT 40*  --    Cardiac Enzymes: No results for input(s): CKTOTAL, CKMB, CKMBINDEX, TROPONINI in the last 168 hours.  BNP (last 3 results) No results for input(s): BNP in the last 8760 hours.  ProBNP (last 3 results) No results for input(s): PROBNP in the last 8760 hours.  CBG: No results for input(s): GLUCAP in the last 168 hours.  Radiological Exams on Admission: Dg Chest Portable 1 View  03/03/2015  CLINICAL DATA:  Fever.  Recent PEG tube placement EXAM: PORTABLE  CHEST 1 VIEW COMPARISON:  05/21/2014 FINDINGS: Bibasilar infiltrates right greater than left. Small right effusion. Negative for heart failure. Heart size within normal limits. IMPRESSION: Bibasilar infiltrates right greater left consistent with pneumonia. Small right effusion. Electronically Signed   By: Marlan Palau M.D.   On: 03/03/2015 12:00    EKG: Independently reviewed.   Assessment/Plan Principal Problem:   Sepsis (HCC) Active Problems:   HCAP (healthcare-associated pneumonia)   Hypernatremia   Hypotension (arterial)   Multiple sclerosis (HCC)   Thrombocytopenia (HCC)   Lactic acidosis   Acute encephalopathy   Seizure disorder (HCC)   Status post insertion of percutaneous endoscopic gastrostomy (PEG) tube (HCC)   AKI (acute kidney injury) (HCC)   Hyperglycemia   1. Sepsis, likely secondary to healthcare associated pneumonia. Apparently, the patient had been started on Rocephin for pneumonia one week ago according to her sister, [name]. The patient presents with hypotension, tachycardia, fever, and infiltrates on the chest x-ray. She also has lactic acidosis. All signs/symptoms consistent with sepsis with pneumonia being the likely etiology. Blood cultures were ordered in the ED. She was given Zosyn and vancomycin. We'll continue these antibiotics and IV fluids for volume repletion. Will add oxygen as needed. Will add Xopenex nebulizer as needed. 2. Hypotension, etiology multifactorial including dehydration and possibly sepsis. The patient's blood pressure was in the 80s to 90s systolically, but not necessarily shock. Given her serum sodium and clinical picture, it is likely she is severely dehydrated. Her blood pressure did improve with 1 L of normal saline. We'll continue vigorous IV fluid hydration. 3. Hypernatremia. The patient's serum sodium is 167. The elevation is likely from dehydration from a lack of adequate oral intake. She is status post PEG insertion on 03/01/15, but it  is unclear if it has been used for hydration or otherwise. We'll treat her with vigorous IV fluids with half-normal saline. Will check her serum sodium every 4 hours for the next 48 hours and adjust IV fluids accordingly. 4. Acute kidney injury. The patient has no history of chronic kidney disease. Her creatinine was within normal limits 04/2014. On admission, her creatinine is 1.90. This is likely from prerenal azotemia/dehydration. She will be treated with vigorous IV fluids. 5. Acute encephalopathy. The patient's lethargy and encephalopathy are likely multifactorial including hypernatremia/dehydration,  infection with sepsis. She does have a seizure disorder and it is unclear if she has been receiving her anticonvulsant medications. Will order EEG for further evaluation. We'll hold on CT scan of the head for now as the likely etiology is infection and hypernatremia. 6. Seizure disorder. The patient is treated chronically with Keppra and valproic acid. It is unclear if she had been receiving or taking these medications due to severe dysphagia and the recent PEG tube insertion. Will order a valproic acid level. Will give Depakote and Keppra IV. Will order an EEG for further evaluation. 7. Multiple sclerosis. The patient has end-stage progressive multiple sclerosis. We'll treat her supportively. 8. Thrombocytopenia. The patient has a known history of thrombocytopenia with her baseline platelet count ranging from 94-123. It is 40 on admission. The etiology could be secondary to sepsis and her antiepileptic medications. Will order TSH and vitamin B12 level to rule out deficiency. 9. Hypothyroidism. She is treated chronically with Synthroid. Will restart Synthroid IV on 03/04/15. 10. Hyperglycemia. The patient's venous glucose is 202 on admission. The history does not show diabetes mellitus. Will order hemoglobin A1c and start sliding scale NovoLog.    Code Status: Full code as discussed with sister and POA,  [name] Carignan.  DVT Prophylaxis: SCDs  Family Communication: discussed with [name] her sister and cousins  Disposition Plan: will discharge when clinically appropriate and depending on the hospital course, likely in several days.   Time spent: critical care/ICU time 1 hour and 10 minutes.   [facility] Pager [phone]

## 2015-03-03 NOTE — ED Notes (Signed)
Pt from [facility].  Transported this morning for decreased LOC.  Per ems hypotensive 84/52.  Rate 130's.  CBG 221.  Per ems slight fever at facility.  Pt had peg tube placed Monday at [facility].

## 2015-03-03 NOTE — Progress Notes (Signed)
Dr Conley Rolls at the bedside assessing patient; patient is currently resting comfortably on Bipap with RR of 26 and O2 sats of 95%; ABG results given; no new orders received at this time; will continue to monitor and document any changes

## 2015-03-03 NOTE — Progress Notes (Signed)
ANTIBIOTIC CONSULT NOTE - INITIAL  Pharmacy Consult for Vancomycin and Zosyn Indication:  rule out sepsis  No Known Allergies  Patient Measurements: Height: 4' 9.5" (146.1 cm) Weight: 110 lb (49.896 kg) IBW/kg (Calculated) : 39.75  Vital Signs: Temp: 103.1 F (39.5 C) (12/07 1125) Temp Source: Rectal (12/07 1125) BP: 91/75 mmHg (12/07 1145) Pulse Rate: 58 (12/07 1130) Intake/Output from previous day:   Intake/Output from this shift: Total I/O In: -  Out: 13 [Urine:13]  Labs: No results for input(s): WBC, HGB, PLT, LABCREA, CREATININE in the last 72 hours. CrCl cannot be calculated (Patient has no serum creatinine result on file.). No results for input(s): VANCOTROUGH, VANCOPEAK, VANCORANDOM, GENTTROUGH, GENTPEAK, GENTRANDOM, TOBRATROUGH, TOBRAPEAK, TOBRARND, AMIKACINPEAK, AMIKACINTROU, AMIKACIN in the last 72 hours.   Microbiology: No results found for this or any previous visit (from the past 720 hour(s)).  Medical History: Past Medical History  Diagnosis Date  . Multiple sclerosis (HCC)   . Seizures (HCC)   . Dysphagia   . Dehydration   . Hypertension   . Hemiparesis, left (HCC)   . Pneumonia   . Thrombocytopenia (HCC)   . Aphasia   . Depression   . Hypothyroid   . Esophageal reflux   . Stroke (HCC)   . Lack of coordination   . Contracture of ankle and foot joint   . Obesity   . Muscle weakness   . Dysphagia 12/01/2011  . Chronic pain syndrome 12/01/2011  . GERD (gastroesophageal reflux disease)     Medications:  Prescriptions prior to admission  Medication Sig Dispense Refill Last Dose  . acetaminophen (TYLENOL) 650 MG suppository Place 650 mg rectally daily as needed for mild pain.   03/01/2015 at Unknown time  . BIOTIN PO Take 5,000 mcg by mouth daily.   03/02/2015 at Unknown time  . cefTRIAXone 1 g in dextrose 5 % 50 mL Inject 1 g into the vein daily.   03/02/2015 at Unknown time  . divalproex (DEPAKOTE) 500 MG DR tablet Take 500 mg by mouth 3 (three)  times daily.   03/03/2015 at Unknown time  . interferon beta-1a (AVONEX) 30 MCG/0.5ML injection Inject 30 mcg into the muscle every 7 (seven) days. On Tuesday   03/02/2015 at Unknown time  . levETIRAcetam (KEPPRA) 1000 MG tablet Take 1,000 mg by mouth 2 (two) times daily.    03/02/2015 at Unknown time  . levETIRAcetam (KEPPRA) 250 MG tablet Take 250 mg by mouth daily with lunch.   03/02/2015 at Unknown time  . levothyroxine (SYNTHROID, LEVOTHROID) 137 MCG tablet Take 137 mcg by mouth daily before breakfast.   03/01/2015 at Unknown time  . LORazepam (ATIVAN) 2 MG/ML concentrated solution Inject 1 mg into the muscle every 4 (four) hours as needed for seizure. Sign or symptoms of seizure activity   unknown  . Multiple Vitamins-Minerals (MULTIVITAMIN WITH MINERALS) tablet Take 1 tablet by mouth daily.   03/02/2015 at Unknown time  . Multiple Vitamins-Minerals (ZINC PO) Take 1 tablet by mouth daily.   03/02/2015 at Unknown time  . Nutritional Supplements (ISOSOURCE 1.5 CAL PO) Take 25 mLs by mouth daily.   03/03/2015 at Unknown time  . oxyCODONE (OXY IR/ROXICODONE) 5 MG immediate release tablet Take 5 mg by mouth every 8 (eight) hours.   03/03/2015 at Unknown time  . ranitidine (ZANTAC) 150 MG tablet Take 150 mg by mouth at bedtime.   03/02/2015 at Unknown time  . senna (SENOKOT) 8.6 MG tablet Take 1 tablet by mouth 2 (two)  times daily.    03/03/2015 at Unknown time  . vitamin C (ASCORBIC ACID) 500 MG tablet Take 500 mg by mouth 2 (two) times daily.   03/02/2015 at Unknown time   Assessment: 64 yo female with altered mental status and lethargic for the past several days. Pt has recent PEG placed at  [facility] 2 days ago d/t progressive dysphagia. No nausea, vomiting, or diarrhea. Pt is febrile at temp of 103.1, hypotensive and tachycardic. Chest xray shows bibasal infiltrates right greater than left consistent with pneumonia. Empiric broad spectrum tx with Vancomycin and Zosyn.   Goal of Therapy:  Vancomycin trough  level 15-20 mcg/ml  Plan:  Zosyn 3.375g iv Q8h extended dose interval Vancomycin 1gm loading dose, then 500mg  iv q24h Measure antibiotic drug levels at steady state Follow up culture results  Elder Cyphers, BS Loura Back, BCPS Clinical Pharmacist Pager [phone] 03/03/2015,12:03 PM

## 2015-03-03 NOTE — Progress Notes (Signed)
Paged Craige Cotta, NP about concern of patient with low oxygen saturation and tachypnea, requesting stat ABG

## 2015-03-03 NOTE — Progress Notes (Addendum)
RN, Lavonna Rua, paged this NP secondary to pt being hypoxic and tachypneic. Satting 85% on 4L O2 and RR in the 40s. Is here for HCAP, sepsis and noted lactic acidosis. Stat ABG and apply bipap now. Rechecking labs. Recheck ABG 2-3 hours after bipap placed. Will follow. [facility], NP [facility] called and spoke to Dr. Conley Rolls of [facility] who is in the house at [facility]. Made him aware of changes in status and present plan. He will go see pt. Update: ABG looks OK but pt already on bipap when drawn. RT can adjust bipap as necessary. BMP shows sodium is coming down, but pt remains acidotic with a high gap and low bicarb. Lactate pending as well.  KJKG, NP Update: ABG after bipap is worse. This NP called Dr. Conley Rolls who will take over from here and direct plan of care. Pt is a full code at present.  KJKG, NP

## 2015-03-04 ENCOUNTER — Inpatient Hospital Stay (HOSPITAL_COMMUNITY): Payer: Medicare Other

## 2015-03-04 DIAGNOSIS — J9601 Acute respiratory failure with hypoxia: Secondary | ICD-10-CM | POA: Diagnosis present

## 2015-03-04 DIAGNOSIS — J189 Pneumonia, unspecified organism: Secondary | ICD-10-CM

## 2015-03-04 DIAGNOSIS — K922 Gastrointestinal hemorrhage, unspecified: Secondary | ICD-10-CM | POA: Diagnosis not present

## 2015-03-04 DIAGNOSIS — G35 Multiple sclerosis: Secondary | ICD-10-CM

## 2015-03-04 LAB — BLOOD GAS, ARTERIAL
ACID-BASE DEFICIT: 7.5 mmol/L — AB (ref 0.0–2.0)
ACID-BASE DEFICIT: 2 mmol/L (ref 0.0–2.0)
BICARBONATE: 18.4 meq/L — AB (ref 20.0–24.0)
Bicarbonate: 22 mEq/L (ref 20.0–24.0)
DELIVERY SYSTEMS: POSITIVE
DRAWN BY: 382351
Drawn by: 382351
Expiratory PAP: 7
FIO2: 0.6
FIO2: 1
INSPIRATORY PAP: 14
LHR: 18 {breaths}/min
LHR: 10 {breaths}/min
MECHVT: 400 mL
O2 Saturation: 87.7 %
O2 Saturation: 99.5 %
PEEP/CPAP: 5 cmH2O
Patient temperature: 37
Patient temperature: 37
pCO2 arterial: 34.4 mmHg — ABNORMAL LOW (ref 35.0–45.0)
pCO2 arterial: 52.2 mmHg — ABNORMAL HIGH (ref 35.0–45.0)
pH, Arterial: 7.28 — ABNORMAL LOW (ref 7.350–7.450)
pH, Arterial: 7.323 — ABNORMAL LOW (ref 7.350–7.450)
pO2, Arterial: 337 mmHg — ABNORMAL HIGH (ref 80.0–100.0)
pO2, Arterial: 64.9 mmHg — ABNORMAL LOW (ref 80.0–100.0)

## 2015-03-04 LAB — BASIC METABOLIC PANEL
Anion gap: 6 (ref 5–15)
BUN: 37 mg/dL — AB (ref 6–20)
CALCIUM: 8.2 mg/dL — AB (ref 8.9–10.3)
CO2: 23 mmol/L (ref 22–32)
CREATININE: 1.33 mg/dL — AB (ref 0.44–1.00)
Chloride: 129 mmol/L — ABNORMAL HIGH (ref 101–111)
GFR calc Af Amer: 48 mL/min — ABNORMAL LOW (ref >60)
GFR calc non Af Amer: 41 mL/min — ABNORMAL LOW (ref >60)
GLUCOSE: 147 mg/dL — AB (ref 65–99)
Potassium: 4 mmol/L (ref 3.5–5.1)
Sodium: 158 mmol/L — ABNORMAL HIGH (ref 135–145)

## 2015-03-04 LAB — URINE CULTURE: CULTURE: NO GROWTH

## 2015-03-04 LAB — LACTIC ACID, PLASMA: LACTIC ACID, VENOUS: 2.8 mmol/L — AB (ref 0.5–2.0)

## 2015-03-04 LAB — HEPATIC FUNCTION PANEL
ALT: 10 U/L — ABNORMAL LOW (ref 14–54)
AST: 55 U/L — AB (ref 15–41)
Albumin: 1.6 g/dL — ABNORMAL LOW (ref 3.5–5.0)
Alkaline Phosphatase: 122 U/L (ref 38–126)
BILIRUBIN DIRECT: 0.3 mg/dL (ref 0.1–0.5)
BILIRUBIN INDIRECT: 0.4 mg/dL (ref 0.3–0.9)
BILIRUBIN TOTAL: 0.7 mg/dL (ref 0.3–1.2)
Total Protein: 6.2 g/dL — ABNORMAL LOW (ref 6.5–8.1)

## 2015-03-04 LAB — HEMOGLOBIN A1C
Hgb A1c MFr Bld: 6.3 % — ABNORMAL HIGH (ref 4.8–5.6)
Mean Plasma Glucose: 134 mg/dL

## 2015-03-04 MED ORDER — FUROSEMIDE 10 MG/ML IJ SOLN
40.0000 mg | Freq: Once | INTRAMUSCULAR | Status: AC
  Administered 2015-03-04: 40 mg via INTRAVENOUS
  Filled 2015-03-04: qty 4

## 2015-03-04 MED ORDER — HYDROCORTISONE NA SUCCINATE PF 100 MG IJ SOLR
100.0000 mg | Freq: Three times a day (TID) | INTRAMUSCULAR | Status: DC
  Administered 2015-03-04: 100 mg via INTRAVENOUS
  Filled 2015-03-04: qty 2

## 2015-03-04 MED ORDER — DILTIAZEM HCL 100 MG IV SOLR
5.0000 mg/h | INTRAVENOUS | Status: DC
  Administered 2015-03-04: 5 mg/h via INTRAVENOUS
  Filled 2015-03-04: qty 100

## 2015-03-04 MED ORDER — PANTOPRAZOLE SODIUM 40 MG IV SOLR
8.0000 mg/h | INTRAVENOUS | Status: DC
  Filled 2015-03-04 (×2): qty 80

## 2015-03-04 MED ORDER — DILTIAZEM HCL 25 MG/5ML IV SOLN
10.0000 mg | Freq: Once | INTRAVENOUS | Status: AC
  Administered 2015-03-04: 10 mg via INTRAVENOUS

## 2015-03-04 MED ORDER — MORPHINE SULFATE 25 MG/ML IV SOLN
1.0000 mg/h | INTRAVENOUS | Status: DC
  Filled 2015-03-04: qty 10

## 2015-03-04 MED ORDER — MORPHINE SULFATE 25 MG/ML IV SOLN
INTRAVENOUS | Status: AC
  Filled 2015-03-04: qty 10

## 2015-03-04 MED ORDER — EPINEPHRINE HCL 0.1 MG/ML IJ SOSY
PREFILLED_SYRINGE | INTRAMUSCULAR | Status: AC
  Administered 2015-03-04: 1 mg
  Filled 2015-03-04: qty 10

## 2015-03-04 MED ORDER — NOREPINEPHRINE BITARTRATE 1 MG/ML IV SOLN
INTRAVENOUS | Status: AC
  Filled 2015-03-04: qty 4

## 2015-03-04 MED ORDER — PANTOPRAZOLE SODIUM 40 MG IV SOLR: 40.0000 mg | Freq: Once | INTRAVENOUS | Status: DC

## 2015-03-04 MED ORDER — DILTIAZEM HCL 25 MG/5ML IV SOLN
INTRAVENOUS | Status: AC
  Administered 2015-03-04: 10 mg via INTRAVENOUS
  Filled 2015-03-04: qty 5

## 2015-03-04 MED ORDER — MIDAZOLAM HCL 5 MG/ML IJ SOLN: 10.0000 mg | Freq: Once | INTRAMUSCULAR | Status: DC

## 2015-03-04 MED ORDER — NOREPINEPHRINE BITARTRATE 1 MG/ML IV SOLN
0.0000 ug/min | INTRAVENOUS | Status: DC
  Administered 2015-03-04: 17.5 ug/min via INTRAVENOUS
  Filled 2015-03-04: qty 4

## 2015-03-04 MED ORDER — PANTOPRAZOLE SODIUM 40 MG IV SOLR: 40.0000 mg | Freq: Two times a day (BID) | INTRAVENOUS | Status: DC

## 2015-03-04 MED ORDER — MIDAZOLAM HCL 10 MG/2ML IJ SOLN
INTRAMUSCULAR | Status: AC
  Filled 2015-03-04: qty 2

## 2015-03-04 MED ORDER — SODIUM CHLORIDE 0.9 % IV BOLUS (SEPSIS)
1000.0000 mL | Freq: Once | INTRAVENOUS | Status: AC
  Administered 2015-03-04: 1000 mL via INTRAVENOUS

## 2015-03-04 NOTE — Progress Notes (Signed)
eLink Physician-Brief Progress Note Patient Name: [name] DOB: 03/18/1951 MRN: [id number]   Date of Service  03/04/2015  HPI/Events of Note  Camera check on patient with sepsis secondary to pneumonia & worsening hypoxic & hypercarbic respiratory failure despite BiPAP therapy. Patient with known aphasia but full code per report.  eICU Interventions  Spoke with Dr. Nedra Hai regarding worsening status. Recommended endotracheal intubation or further code status discussion with family given worsening status.     Intervention Category Major Interventions: Respiratory failure - evaluation and management  [name] 03/04/2015, 12:59 AM

## 2015-03-04 NOTE — Progress Notes (Signed)
eLink Physician-Brief Progress Note Patient Name: [name] DOB: 1950-10-26 MRN: [id number]   Date of Service  03/04/2015  HPI/Events of Note  Evaluated Portable CXR post intubation.   eICU Interventions  ETT in good position 2cm above carina.     Intervention Category Intermediate Interventions: Diagnostic test evaluation  [name] 03/04/2015, 2:46 AM

## 2015-03-04 NOTE — Clinical Documentation Improvement (Signed)
Hospitalist  Can the diagnosis of pressure ulcer be further specified? Please specify site of ulcers in the progress notes and in the discharge summary.    Document if pressure ulcer with stage is Present on Admission   Document Site with laterality - Elbow, Back (upper/lower), Sacral, Hip, Buttock, Ankle, Heel, Head, Other (Specify)  Pressure Ulcer Stage - Stage1, Stage 2, Stage 3, Stage 4, Unstageable, Unspecified, Unable to Clinically Determine  Document any associated diagnoses/conditions such as: with gangrene  Other  Clinically Undetermined    Supporting Information: Decubitus ulcers, stage II noted per 12/07 progress notes.   Please exercise your independent, professional judgment when responding. A specific answer is not anticipated or expected.   Thank Sabino Donovan [employer] [facility] [place] [phone]

## 2015-03-04 NOTE — Progress Notes (Signed)
Craige Cotta, NP paged with repeat ABG results that were worsening; no new orders received at this time; Dr Conley Rolls had been called by Craige Cotta to assess patient for possible intubation

## 2015-03-04 NOTE — Progress Notes (Signed)
[facility] PROGRESS NOTE  RAVEENA HEBDON ONG:[id number] DOB: 04/18/1950 DOA: 03/03/2015 PCP: Colon Branch, MD    Code Status: DO NOT RESUSCITATE Family Communication: Discussed with his sister and POA, [name] on 03/03/2015 Disposition Plan: Patient is terminally ill and may not survive the remainder of hospitalization; so disposition is unclear.   Consultants:  None  Procedures:  Intubation/mechanical ventilation 03/04/15. Extubated 03/04/15.  BiPAP  Antibiotics:  Vancomycin 03/03/15>> 03/04/15  Zosyn 03/03/15>> 03/04/15  HPI/Subjective: Events noted overnight for the patient's decompensation leading to respiratory failure with hypoxia and subsequent intubation and ventilation. The family decided to withdraw all treatment and to make her comfort care management. The patient is laying in bed, unresponsive, but her eyes are open. Nursing reports that the patient has been or appears to be comfortable and therefore the morphine drip has not been started yet.  Objective: Filed Vitals:   03/04/15 0645 03/04/15 0700  BP: 102/73 95/72  Pulse:    Temp: 97.6 F (36.4 C) 97.7 F (36.5 C)  Resp: 20 23   pulse 84. O2 sats 100% on nasal cannula oxygen.  Intake/Output Summary (Last 24 hours) at 03/04/15 1100 Last data filed at 03/04/15 0600  Gross per 24 hour  Intake 2433.67 ml  Output   2313 ml  Net 120.67 ml   Filed Weights   03/03/15 1200 03/03/15 1500  Weight: 49.896 kg (110 lb) 49.2 kg (108 lb 7.5 oz)    Exam:   General:  64 year old African-American woman who is encephalopathic and nonresponsive, in no acute distress.  Cardiovascular: S1, S2, with a soft systolic murmur.  Respiratory: Breathing is nonlabored. Coarse breath sounds with no audible wheezes or significant crackles.  Abdomen: PEG tube in the epigastrium draining bloody fluid; bowel sounds hypoactive; no obvious tenderness or distention.  Musculoskeletal/extremities: Trace of pedal edema  bilaterally.  Neurologic: The patient is encephalopathic and nonresponsive. Her eyes are open, but she does not respond to her name or vigorous provocation.   Data Reviewed: Basic Metabolic Panel:  Recent Labs Lab 03/03/15 1135 03/03/15 1202 03/03/15 2030 03/04/15 0102  NA 160* 167* 146* 158*  K 3.7 3.8 3.6 4.0  CL 127* 126* 109 129*  CO2 29  --  20* 23  GLUCOSE 202* 197* 144* 147*  BUN 46* 47* 34* 37*  CREATININE 1.98* 1.90* 1.51* 1.33*  CALCIUM 9.2  --  7.0* 8.2*   Liver Function Tests:  Recent Labs Lab 03/03/15 1135 03/04/15 0300  AST 58* 55*  ALT 14 10*  ALKPHOS 136* 122  BILITOT 0.5 0.7  PROT 7.2 6.2*  ALBUMIN 1.8* 1.6*   No results for input(s): LIPASE, AMYLASE in the last 168 hours. No results for input(s): AMMONIA in the last 168 hours. CBC:  Recent Labs Lab 03/03/15 1135 03/03/15 1202  WBC 11.7*  --   NEUTROABS 5.1  --   HGB 11.2* 12.9  HCT 38.5 38.0  MCV 99.5  --   PLT 40*  --    Cardiac Enzymes: No results for input(s): CKTOTAL, CKMB, CKMBINDEX, TROPONINI in the last 168 hours. BNP (last 3 results) No results for input(s): BNP in the last 8760 hours.  ProBNP (last 3 results) No results for input(s): PROBNP in the last 8760 hours.  CBG:  Recent Labs Lab 03/03/15 1700 03/03/15 1826 03/03/15 2011 03/03/15 2317  GLUCAP 119* 143* 126* 109*    Recent Results (from the past 240 hour(s))  Urine culture     Status: None (Preliminary result)  Collection Time: 03/03/15 11:23 AM  Result Value Ref Range Status   Specimen Description URINE, CATHETERIZED  Final   Special Requests NONE  Final   Culture   Final    NO GROWTH < 24 HOURS Performed at [facility]    Report Status PENDING  Incomplete  Culture, blood (routine x 2)     Status: None (Preliminary result)   Collection Time: 03/03/15 11:37 AM  Result Value Ref Range Status   Specimen Description BLOOD RIGHT HAND DRAWN BY RN  Final   Special Requests BOTTLES DRAWN AEROBIC  ONLY 4CC  Final   Culture NO GROWTH < 24 HOURS  Final   Report Status PENDING  Incomplete  Culture, blood (routine x 2)     Status: None (Preliminary result)   Collection Time: 03/03/15 11:47 AM  Result Value Ref Range Status   Specimen Description BLOOD LEFT ANTECUBITAL  Final   Special Requests BOTTLES DRAWN AEROBIC ONLY 6CC  Final   Culture NO GROWTH < 24 HOURS  Final   Report Status PENDING  Incomplete  MRSA PCR Screening     Status: None   Collection Time: 03/03/15  2:46 PM  Result Value Ref Range Status   MRSA by PCR NEGATIVE NEGATIVE Final    Comment:        The GeneXpert MRSA Assay (FDA approved for NASAL specimens only), is one component of a comprehensive MRSA colonization surveillance program. It is not intended to diagnose MRSA infection nor to guide or monitor treatment for MRSA infections.      Studies: Dg Chest Port 1 View  03/04/2015  CLINICAL DATA:  64 year old female with endotracheal tube placement. EXAM: PORTABLE CHEST 1 VIEW COMPARISON:  Chest radiograph dated 09/30/2010. FINDINGS: Endotracheal tube with tip approximately 1.7 cm above the carina. The tip of the endotracheal tube while above the carina it is tilted towards the right mainstem bronchus recommend retraction and repositioning by approximately 3-4 cm. Single-view of the chest demonstrates bibasilar interstitial prominence, likely atelectatic changes. Pneumonia is less likely. Clinical correlation and follow-up recommended. No focal consolidation, pleural effusion, or pneumothorax. The cardiac silhouette is within normal limits. The osseous structures are grossly unremarkable. Prior IMPRESSION: Endotracheal tube above the carina. Recommend retraction by approximately 3 - 4 cm for better positioning. Electronically Signed   By: Elgie Collard M.D.   On: 03/04/2015 02:01   Dg Chest Portable 1 View  03/03/2015  CLINICAL DATA:  Fever.  Recent PEG tube placement EXAM: PORTABLE CHEST 1 VIEW COMPARISON:   05/21/2014 FINDINGS: Bibasilar infiltrates right greater than left. Small right effusion. Negative for heart failure. Heart size within normal limits. IMPRESSION: Bibasilar infiltrates right greater left consistent with pneumonia. Small right effusion. Electronically Signed   By: Marlan Palau M.D.   On: 03/03/2015 12:00    Scheduled Meds:  Continuous Infusions: . morphine     Assessment and plan:  Principal Problem:   Sepsis (HCC) Active Problems:   HCAP (healthcare-associated pneumonia)   Hypernatremia   Hypotension (arterial)   Acute respiratory failure with hypoxia (HCC)   UGI bleed   Multiple sclerosis (HCC)   Thrombocytopenia (HCC)   Lactic acidosis   Acute encephalopathy   Seizure disorder (HCC)   Status post insertion of percutaneous endoscopic gastrostomy (PEG) tube (HCC)   AKI (acute kidney injury) (HCC)   Hyperglycemia   Pressure ulcer  Patient is a 64 year old woman with end-stage multiple sclerosis, previous stroke with chronic left-sided paresis, chronic expressive aphasia and dysphagia  secondary to severe multiple sclerosis, and seizure disorder, who was admitted on 03/03/2015 for altered mental status at the nursing facility. The patient had a PEG tube inserted at [facility] on 02/28/2014 due to progressive dysphagia. She was also started on Rocephin at the SNF for pneumonia and decubitus ulcers. In the ED, she was febrile with temperature of 103.1, relatively hypotensive with a systolic blood pressure ranging from 87-107, tachycardic with heart rate in the 120s to 130s, and with a respiratory rate ranging from 13 to 45. Her EKG revealed sinus tachycardia with PACs and a heart rate of 129 bpm. Her chest x-ray revealed bibasal infiltrates right greater than left consistent with pneumonia and a small right pleural effusion. Her lab data were significant for serum sodium of 167, BUN of 47, creatinine of 1.90, glucose of 197, lactic acid of 358, and platelet count of 40.  Her urinalysis revealed large hemoglobin and RBCs, but no leukocytes or WBCs (cath specimen). On admission, I discussed CODE STATUS with the patient's sister and POA, Ms. [name] Lieser as the patient was critically ill and her prognosis was poor. After a full discussion, she was made to be a full code per Ms. Driscoll request.  For treatment of sepsis secondary to HCAP, blood cultures and a urine culture were ordered in the ED. She was bolused with 2 L of normal saline. She was given Zosyn and vancomycin in the ED. These antibiotics were continued. She was subsequently started on vigorous IV fluids with half-normal saline due to her dehydration, acute kidney injury, and severe hypernatremia. Basic metabolic panel was ordered every 4 hours 48 hours for monitoring. She was made to be nothing by mouth except for some free water via her PEG tube. All of her chronic medications were withheld with the exception of Keppra and Depakote which were given IV.   Apparently, several hours after admission, the patient became more hypoxic and tachypneic. She was placed on BiPAP. ABG on BiPAP revealed pH of 7.36, PCO2 of 46, and PO2 of 121. Her serum sodium had improved to 146. After several hours on BiPAP, her follow-up ABG worsened with a pH of 7.28 and a PCO2 of 52.2 and O2 of 65. Her lactic acid was still elevated at 3 or above. Her valproic acid level was therapeutic at 60. Nocturnist Dr. Conley Rolls evaluated the patient. Eventually, the ICU E-link physician recommended intubation due to worsening ABG showing and Protonix drip was started. hypoxia and hypercarbia on BiPAP. The family was updated and agreed with intubation/mechanical ventilation. Dr. Conley Rolls performed intubation successfully. IV hydrocortisone was given for sepsis. IV Lasix was also given empirically. Her blood pressure dropped and she was given a bolus of normal saline. She was then started on pressor support with Levophed. She developed paroxysmal atrial fibrillation  with RVR. IV diltiazem was given which improved her heart rate. Several hours later, the patient apparently developed some bleeding from the PEG tube. An NG tube was placed to low intermittent suction with bloody drainage noted.  The family was again updated by the nursing staff and Dr. Nedra Hai. Following their discussion, her family, namely [name], decided to make the patient a full comfort care status. Therefore, the patient was extubated and all medications and fluids were discontinued with the exception of oxygen and morphine.  The patient remains extubated and unresponsive. She is currently hemodynamically stable, but I anticipate that she will not survive this hospitalization. Her prognosis is likely 24 hours or less. We will continue comfort  care measures. Morphine drip has been ordered, but not started it per nursing as the patient appears to be comfortable. Will transfer her out of the ICU if she does not expire today.     Time spent: 35 minutes.    [facility]  [facility] Pager [phone]. If 7PM-7AM, please contact night-coverage at [email or web address], password [facility] 03/04/2015, 11:00 AM  LOS: 1 day

## 2015-03-04 NOTE — Progress Notes (Signed)
CRITICAL VALUE ALERT  Critical value received:  Lactic Acid 2.8  Date of notification:  03/04/2015  Time of notification:  0141  Critical value read back:Yes.    Nurse who received alert:  Ardith Dark, RN  MD notified (1st page):  Dr Conley Rolls  Time of first page:  0141  MD notified (2nd page):  Time of second page:  Responding MD:  Dr Conley Rolls  Time MD responded: [phone]

## 2015-03-04 NOTE — Progress Notes (Signed)
CRITICAL VALUE ALERT  Critical value received:  Lactic acid 3.0  Date of notification:  03/03/2015  Time of notification:  2120  Critical value read back:Yes.    Nurse who received alert:  Lionel December, RN  MD notified (1st page):  Craige Cotta, NP  Time of first page:  2125  MD notified (2nd page):  Time of second page:  Responding MD:  Craige Cotta, NP  Time MD responded:  2128  Orders received form IVF bolus see MAR

## 2015-03-04 NOTE — Procedures (Signed)
Extubation Procedure Note  Patient Details:   Name: [name] DOB: February 10, 1951 MRN: [id number]   Airway Documentation:  Airway 7.5 mm (Active)  Secured at (cm) 19 cm 03/04/2015  2:48 AM  Measured From Lips 03/04/2015  2:48 AM  Secured Location Right 03/04/2015  2:48 AM  Secured By [employer] 03/04/2015  2:48 AM  Site Condition Dry 03/04/2015  2:48 AM    Evaluation  O2 sats: stable throughout Complications: No apparent complications Patient did tolerate procedure well. Bilateral Breath Sounds: Diminished, Coarse crackles Suctioning: Airway, Oral No   Patient terminally extubated and placed on 2 lpm nasal cannula per Conley Rolls, MD.  Lady Saucier 03/04/2015, 4:17 AM

## 2015-03-04 NOTE — Progress Notes (Signed)
Night coverage: Called by PCCM via E link to intubate patient for worsening respiratory failure. 64 yo with endstage MS, prior stroke with expressive aphasia, dysphagia requiring TF, SNF facility admitted for severe sepsis, HCAP on Van/Zosyn, AKI,  who went into respiratory insufficiency earlier today, though with acceptable ABG at that time.  She was given BIPAP with improvement of her RR and vital signs.  A follow up ABG showed worseing respiratory acidosis (7.28/52/ pOx; 65) and bipap was adjusted to increase ventilation.  PCCM however recommended intubation. She was intubated by me.  Good color change on ETCO2, improving of Sat 96 percent.   She became hypotensive and tachycardic. Bedside epinephrine drip was given, until Levefed became available. CXR showed ET tube was too low, and it was pulled back with resulting ET tip just above the carina. Will obtain ABG in one hour.  Hydrocortisone was given for sepsis requiring pressor at this time. I will call her family at this time.

## 2015-03-04 NOTE — Care Management Note (Signed)
Case Management Note  Patient Details  Name: [name] MRN: [id number] Date of Birth: 1950-11-19  Subjective/Objective:                  Pt is from [facility]. Pt has been made comfort care and morphine gtt has been initiated. Pt is stable at this time,   Action/Plan: MD's anticipate in hospital death <24 hrs.    Expected Discharge Date:  03/06/15               Expected Discharge Plan:  Hospice Medical Facility  In-House Referral:  Clinical Social Work  Discharge planning Services  CM Consult  Post Acute Care Choice:  NA Choice offered to:  NA  DME Arranged:    DME Agency:     HH Arranged:    HH Agency:     Status of Service:  In process, will continue to follow  Medicare Important Message Given:    Date Medicare IM Given:    Medicare IM give by:    Date Additional Medicare IM Given:    Additional Medicare Important Message give by:     If discussed at Long Length of Stay Meetings, dates discussed:    Additional Comments:  Malcolm Metro, RN 03/04/2015, 11:33 AM

## 2015-03-04 NOTE — Progress Notes (Signed)
I was able to update her sister [name] and was able to update her.  Patient subsequently went to afib with RVR, and required IV Diltiazem.  She also has bleeding thru her PEG tube.  Despite the fact that Dr Sherrie Mustache had spent an hour speaking with family about her code status, family at this time informed me that their wishes is for [place] to remove all IV lines, and ET tube, and to keep her comfortable.  I told her that her death would be imminent, and the family accepted it.  They asked me to tell her they love her, which I did. These will be implemented.

## 2015-03-04 NOTE — Progress Notes (Signed)
Nutrition Brief Note  Chart reviewed. Pt now transitioning to comfort care.  No further nutrition interventions warranted at this time.  Please re-consult as needed.   Lynn Pratik Dalziel MS,RD,CSG,LDN Office: #[phone] Pager: #[phone]    

## 2015-03-04 NOTE — Clinical Social Work Note (Signed)
Clinical Social Work Assessment  Patient Details  Name: [name] MRN: [id number] Date of Birth: 1950/05/31  Date of referral:  03/04/15               Reason for consult:  Facility Placement                Permission sought to share information with:    Permission granted to share information::     Name::        Agency::     Relationship::     Contact Information:     Housing/Transportation Living arrangements for the past 2 months:  Skilled Building surveyor of Information:   (Sister, [name]) Patient Interpreter Needed:  None Criminal Activity/Legal Involvement Pertinent to Current Situation/Hospitalization:  No - Comment as needed Significant Relationships:  Siblings Lives with:  Facility Resident Do you feel safe going back to the place where you live?  Yes Need for family participation in patient care:  Yes (Comment)  Care giving concerns: none identified, facility resident.  Social Worker assessment / plan:   CSW spoke with patient's sister, Jaslin Novitski. Ms. Shands advised that patient has been a resident at [facility] for the past 8-9 years.  She indicated that patient was bed bound and all of her ADLs were performed by facility staff.  Ms. Player stated that she lives in [place] and that she visits occasionally. She advised that they have another sister, [name], who lives in the area but she is only able to visits occasionally also due to have MS.  Ms. Hoefling then stated that the doctor has told her that patient was "going to pass and that she does not feel this conversation is really necessary" and ended discussion.  CSW left a message for [place] at [facility].   Employment status:  Disabled (Comment on whether or not currently receiving Disability) Insurance information:  Medicare, Medicaid In [place] PT Recommendations:  Not assessed at this time Information / Referral to community resources:     Patient/Family's Response to care: Ms. Bunyan accepts  that patient is at the end of her life.   Patient/Family's Understanding of and Emotional Response to Diagnosis, Current Treatment, and Prognosis:  Family accepts that patient is at the end of her life.   Emotional Assessment Appearance:  Appears stated age Attitude/Demeanor/Rapport:  Unable to Assess Affect (typically observed):  Unable to Assess Orientation:    Alcohol / Substance use:  Not Applicable Psych involvement (Current and /or in the community):  No (Comment)  Discharge Needs  Concerns to be addressed:  Discharge Planning Concerns Readmission within the last 30 days:  No Current discharge risk:  None Barriers to Discharge:  No Barriers Identified   Annice Needy, LCSW 03/04/2015, 11:19 AM [phone]

## 2015-03-04 NOTE — Progress Notes (Signed)
Shift Summary [id number]  2325- 12 lead ECG completed for changes patient was having on the bedside cardiac monitor  0030- Dr Conley Rolls called to check on the condition of the patient; updated on current status and vital signs  0045- Dr Conley Rolls called and ordered 40mg  of Lasix IV to be given to the patient  0100- Spoke with Dr Jamison Neighbor from [facility] about patient condition, current setting of BiPAP and worsening ABG results, summary of patient history, code status, and family wishes at this time  0137- Dr Conley Rolls at the bedside to intubate the patient for worsening resp status, patient was intubated on the second attempt by Dr Conley Rolls, 10mg  of Versed ordered for intubation but was not given due to the drop in the patients blood pressure  0140- Dr Conley Rolls called patients sister [name] and undated on the current status and that patient was placed on the ventilator; other sister [name] called asking for an update as well  0148- 1L NS bolus given and patient started on Levophed drip titrated to SBp greater than ; portable chest film completed for tube placement; tube pulled back to the 19cm mark and second film taken; tube secured with commercial tube holder  0159- Levophed continued to be titrated; 12 lead ECG completed with HR of 170 resulted in A-fib RVR  0202- 10mg  Cardizem bolus given IV per order of Dr Conley Rolls  0235-Called Dr Conley Rolls to inform him that patient started having frank blood coming from her PEG tube; orders received from type and screen, serial H&H's every 4 hours and to bolus patient with 40mg  Protonix and start a Protonix drip at 8mg /hr  0246- Called Dr Jamison Neighbor at [facility] and asked to have him look at the patients chest film post intubation to verify tube placement there was question and pulling back or leaving at 19cm- tube was left in place  0310- spoke with patients sister [name] and gave update on patients condition; sister expressed wishes that patient be made comfortable and have all tubes and lines  removed; she no longer wanted her sister to suffer; explained to [name] that since this was over the phone I needed her to speak to two people and she was then transferred to Dr Conley Rolls  0330- Dr Conley Rolls in the unit at the patients bedside; discussion had with patients family is to remove the ETT, stop all drips, and make patient comfortable, patient no longer full code but comfort measures only; all cardiac medications and fluids were stopped per order of Dr Conley Rolls and all labs and treatments were discontinued by Dr Conley Rolls  [phone]- Patient extubated to 2L [place] by RT; no discomfort noted, patient tolerated extubation well; sister [name] called for an update and was informed that patient had just had the tube removed and all drips had been stopped; sister ask that they be notified when patient passes

## 2015-03-05 DIAGNOSIS — Z515 Encounter for palliative care: Secondary | ICD-10-CM

## 2015-03-05 DIAGNOSIS — G9340 Encephalopathy, unspecified: Secondary | ICD-10-CM

## 2015-03-05 DIAGNOSIS — A419 Sepsis, unspecified organism: Principal | ICD-10-CM

## 2015-03-05 DIAGNOSIS — K922 Gastrointestinal hemorrhage, unspecified: Secondary | ICD-10-CM

## 2015-03-05 MED ORDER — PANTOPRAZOLE SODIUM 40 MG IV SOLR
40.0000 mg | Freq: Two times a day (BID) | INTRAVENOUS | Status: DC
  Administered 2015-03-06 – 2015-03-09 (×7): 40 mg via INTRAVENOUS
  Filled 2015-03-05 (×8): qty 40

## 2015-03-05 MED ORDER — LEVETIRACETAM IN NACL 1000 MG/100ML IV SOLN
1000.0000 mg | Freq: Two times a day (BID) | INTRAVENOUS | Status: DC
  Administered 2015-03-05 – 2015-03-06 (×2): 1000 mg via INTRAVENOUS
  Filled 2015-03-05 (×6): qty 100

## 2015-03-05 MED ORDER — LEVOTHYROXINE SODIUM 100 MCG IV SOLR
65.0000 ug | Freq: Every day | INTRAVENOUS | Status: DC
  Administered 2015-03-05 – 2015-03-08 (×4): 65 ug via INTRAVENOUS
  Filled 2015-03-05 (×6): qty 5

## 2015-03-05 MED ORDER — MORPHINE SULFATE (PF) 2 MG/ML IV SOLN
2.0000 mg | INTRAVENOUS | Status: DC | PRN
  Administered 2015-03-06: 2 mg via INTRAVENOUS
  Filled 2015-03-05: qty 1

## 2015-03-05 MED ORDER — DEXTROSE-NACL 5-0.45 % IV SOLN
INTRAVENOUS | Status: DC
  Administered 2015-03-05 – 2015-03-06 (×2): 1 mL via INTRAVENOUS

## 2015-03-05 MED ORDER — SODIUM CHLORIDE 0.9 % IV SOLN: 1000.0000 mg | Freq: Two times a day (BID) | INTRAVENOUS | Status: DC

## 2015-03-05 MED ORDER — FREE WATER
100.0000 mL | Freq: Four times a day (QID) | Status: DC
  Administered 2015-03-05 – 2015-03-09 (×16): 100 mL

## 2015-03-05 MED ORDER — SODIUM CHLORIDE 0.9 % IV SOLN
1000.0000 mg | Freq: Two times a day (BID) | INTRAVENOUS | Status: DC
  Filled 2015-03-05 (×6): qty 10

## 2015-03-05 NOTE — Care Management Important Message (Signed)
Important Message  Patient Details  Name: [name] MRN: [id number] Date of Birth: 03-20-1951   Medicare Important Message Given:  Yes    Malcolm Metro, RN 03/05/2015, 11:26 AM

## 2015-03-05 NOTE — Consult Note (Signed)
Consultation Note Date: 03/05/2015   Patient Name: [name]  DOB: 04-05-1950  MRN: [id number]  Age / Sex: 64 y.o., female   PCP: Colon Branch, MD Referring Physician: Elliot Cousin, MD  Reason for Consultation: Establishing goals of care and Psychosocial/spiritual support  Palliative Care Assessment and Plan Summary of Established Goals of Care and Medical Treatment Preferences   Clinical Assessment/Narrative: Ms. [name] is resting quietly in the bed, she opens her eyes but is unable to answer any questions. Call to sister Bianca at [phone] no answer and VM is full.  Call to sister [name] at [phone].  She tells me that [name] is traveling to [place] and they will come to the hospital tomorrow.  She asks, "When will she be released?".  I share that we worry that she is not going to be able to get better, and [name] tells me "she is better than yesterday".   She tells me that they do not want her to return to [facility], but they are looking for a place in [place] where [name], [name], can watch over her.  We talk about her MS, stroke with hemiplegia, and tube feedings, and I share that her illness will not improve but only get worse. She also states angrily, " Well mine did", and states she is walking now.  I share that we have not been able to restart tube feeds d/t bleeding in the stomach, "yeah, she's hemorrhaging".  [name] tells me to call her Knight [name], and is no longer willing to talk.      Contacts/Participants in Discussion: Primary Decision Maker:  Sisters [name] and [name] share [place].  Sister [name] tells me today that her Knight, [name] [phone]) will now help them care for [name].   Code Status/Advance Care Planning:  DNR  Symptom Management:   Morphine infusion, ordered, but not administered d/t no s/s of discomfort.   Palliative Prophylaxis: None at this time.   Psycho-social/Spiritual:    Support System: Resident of [facility].    Desire for further Chaplaincy support:  Not discussed today.   Prognosis: Unable to determine  Discharge Planning:  Skilled Nursing Facility for rehab with Palliative care service follow-up.  [name] tells me today that they are trying to find a SNF in [place] to care for [name], and [name], [name] will help with care.        Chief Complaint:  Altered mental status at the nursing facility. History of Present Illness:  [name] is a 64 y.o. 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 The history is being provided by the EDP and family members. Accordingly, the patient has had worsening lethargy for the past several days. Over the past couple weeks, she has not been eating or taking by mouth medications or food consistently. In fact, she is status post PEG tube insertion at [facility] 2 days ago due to progressive dysphagia. She was also started on Rocephin at the skilled nursing facility for what was thought to be pneumonia and decubitus ulcers. There has been no reported nausea, vomiting, or diarrhea.  In the ED, the patient is febrile with temperature of 103.1, relatively hypotensive with  a systolic blood pressure ranging from 87-107, tachycardic with heart rate in the 120s to 130s, and with a respiratory rate ranging from 13 to 45. Her EKG reveals sinus tachycardia with PACs and a heart rate of 129 bpm. Her chest x-ray reveals bibasal infiltrates right greater than left consistent with pneumonia and a small right pleural effusion. Her lab data are significant for serum sodium of 167, BUN of 47, creatinine of  1.90, glucose of 197, lactic acid of 358, and platelet count of 40. Her urinalysis revealed large hemoglobin and RBCs, but no leukocytes or WBCs (cath specimen). She is being admitted for further evaluation and management.    Primary Diagnoses  Present on Admission:  . HCAP (healthcare-associated pneumonia) . Lactic acidosis . Hypernatremia . Acute encephalopathy . AKI (acute kidney injury) (HCC) . Multiple sclerosis (HCC) . Sepsis (HCC) . Thrombocytopenia (HCC) . Hypotension (arterial) . Hyperglycemia . Pressure ulcer . Acute respiratory failure with hypoxia (HCC)  Palliative Review of Systems: [name] is unable to participate in ROS d/t nonverbal.  I have reviewed the medical record, interviewed the patient and family, and examined the patient. The following aspects are pertinent.  Past Medical History  Diagnosis Date  . Multiple sclerosis (HCC)   . Seizures (HCC)   . Dysphagia   . Dehydration   . Hypertension   . Hemiparesis, left (HCC)   . Pneumonia   . Thrombocytopenia (HCC)   . Aphasia   . Depression   . Hypothyroid   . Esophageal reflux   . Stroke (HCC)   . Lack of coordination   . Contracture of ankle and foot joint   . Obesity   . Muscle weakness   . Dysphagia 12/01/2011  . Chronic pain syndrome 12/01/2011  . GERD (gastroesophageal reflux disease)   . Status post insertion of percutaneous endoscopic gastrostomy (PEG) tube (HCC) 03/01/2015    at [facility]   Social History   Social History  . Marital Status: Single    Spouse Name: N/A  . Number of Children: N/A  . Years of Education: N/A   Social History Main Topics  . Smoking status: Never Smoker   . Smokeless tobacco: None  . Alcohol Use: No  . Drug Use: No  . Sexual Activity: No   Other Topics Concern  . None   Social History Narrative   History reviewed. No pertinent family history. Scheduled Meds:  Continuous Infusions: . morphine     PRN Meds:. Medications Prior to Admission:    Prior to Admission medications   Medication Sig Start Date End Date Taking? Authorizing Provider  acetaminophen (TYLENOL) 650 MG suppository Place 650 mg rectally daily as needed for mild pain.   Yes Historical Provider, MD  BIOTIN PO Take 5,000 mcg by mouth daily.   Yes Historical Provider, MD  cefTRIAXone 1 g in dextrose 5 % 50 mL Inject 1 g into the vein daily. 02/25/15 03/06/15 Yes Historical Provider, MD  divalproex (DEPAKOTE) 500 MG DR tablet Take 500 mg by mouth 3 (three) times daily.   Yes Historical Provider, MD  interferon beta-1a (AVONEX) 30 MCG/0.5ML injection Inject 30 mcg into the muscle every 7 (seven) days. On Tuesday   Yes Historical Provider, MD  levETIRAcetam (KEPPRA) 1000 MG tablet Take 1,000 mg by mouth 2 (two) times daily.    Yes Historical Provider, MD  levETIRAcetam (KEPPRA) 250 MG tablet Take 250 mg by mouth daily with lunch.   Yes Historical Provider, MD  levothyroxine (SYNTHROID, LEVOTHROID)  137 MCG tablet Take 137 mcg by mouth daily before breakfast.   Yes Historical Provider, MD  LORazepam (ATIVAN) 2 MG/ML concentrated solution Inject 1 mg into the muscle every 4 (four) hours as needed for seizure. Sign or symptoms of seizure activity   Yes Historical Provider, MD  Multiple Vitamins-Minerals (MULTIVITAMIN WITH MINERALS) tablet Take 1 tablet by mouth daily.   Yes Historical Provider, MD  Multiple Vitamins-Minerals (ZINC PO) Take 1 tablet by mouth daily.   Yes Historical Provider, MD  Nutritional Supplements (ISOSOURCE 1.5 CAL PO) Take 25 mLs by mouth daily.   Yes Historical Provider, MD  oxyCODONE (OXY IR/ROXICODONE) 5 MG immediate release tablet Take 5 mg by mouth every 8 (eight) hours.   Yes Historical Provider, MD  ranitidine (ZANTAC) 150 MG tablet Take 150 mg by mouth at bedtime.   Yes Historical Provider, MD  senna (SENOKOT) 8.6 MG tablet Take 1 tablet by mouth 2 (two) times daily.    Yes Historical Provider, MD  vitamin C (ASCORBIC ACID) 500 MG tablet Take 500 mg  by mouth 2 (two) times daily.   Yes Historical Provider, MD   No Known Allergies CBC:    Component Value Date/Time   WBC 11.7* 03/03/2015 1135   HGB 12.9 03/03/2015 1202   HCT 38.0 03/03/2015 1202   PLT 40* 03/03/2015 1135   MCV 99.5 03/03/2015 1135   NEUTROABS 5.1 03/03/2015 1135   LYMPHSABS 4.5* 03/03/2015 1135   MONOABS 1.9* 03/03/2015 1135   EOSABS 0.0 03/03/2015 1135   BASOSABS 0.1 03/03/2015 1135   Comprehensive Metabolic Panel:    Component Value Date/Time   NA 158* 03/04/2015 0102   K 4.0 03/04/2015 0102   CL 129* 03/04/2015 0102   CO2 23 03/04/2015 0102   BUN 37* 03/04/2015 0102   CREATININE 1.33* 03/04/2015 0102   GLUCOSE 147* 03/04/2015 0102   CALCIUM 8.2* 03/04/2015 0102   AST 55* 03/04/2015 0300   ALT 10* 03/04/2015 0300   ALKPHOS 122 03/04/2015 0300   BILITOT 0.7 03/04/2015 0300   PROT 6.2* 03/04/2015 0300   ALBUMIN 1.6* 03/04/2015 0300    Physical Exam: Vital Signs: BP 103/76 mmHg  Pulse 76  Temp(Src) 97.5 F (36.4 C) (Other (Comment))  Resp 20  Ht 5' (1.524 m)  Wt 49.2 kg (108 lb 7.5 oz)  BMI 21.18 kg/m2  SpO2 95% SpO2: SpO2: 95 % O2 Device: O2 Device: Nasal Cannula O2 Flow Rate: O2 Flow Rate (L/min): 2 L/min Intake/output summary:  Intake/Output Summary (Last 24 hours) at 03/05/15 1204 Last data filed at 03/05/15 0800  Gross per 24 hour  Intake      0 ml  Output   1000 ml  Net  -1000 ml   LBM: Last BM Date: 03/05/15 Baseline Weight: Weight: 49.896 kg (110 lb) Most recent weight: Weight: 49.2 kg (108 lb 7.5 oz)  Exam Findings:  Constitutional: Ill appearing, frail, looks older than age. shed, and in no distress.   Cardiovascular: RRR, no edema. Pulmonary/Chest: Effort normal and breath sounds normal. He has no wheezes.   Abdominal: Soft, rounded.  There is no tenderness/guarding.  Musculoskeletal:  No voluntary movement.  Skin: Skin is warm and dry. No rash noted.  Psychiatric: Unable to assess.  Nursing note and vitals reviewed.          Palliative Performance Scale: 10%  Tube feeding history, but on hold at this time.  Additional Data Reviewed: Recent Labs     03/03/15  1135  03/03/15  1202  03/03/15  2030  03/04/15  0102  WBC  11.7*   --    --    --   HGB  11.2*  12.9   --    --   PLT  40*   --    --    --   NA  160*  167*  146*  158*  BUN  46*  47*  34*  37*  CREATININE  1.98*  1.90*  1.51*  1.33*     Time In: 0915 Time Out: 1015 Time Total: 60 minutes Greater than 50%  of this time was spent counseling and coordinating care related to the above assessment and plan. GOC discussion shared with nursing staff, CM, SW, and Dr. Sherrie Mustache.   Signed by: Katheran Awe, NP  Katheran Awe, NP  03/05/2015, 12:04 PM  Please contact Palliative Medicine Team phone at [phone] for questions and concerns.

## 2015-03-05 NOTE — Progress Notes (Signed)
Arrived to Room 305 via bed from ICU, foley catheter intact with amber urine, O2 at 2L [place], PEG tube clamped with bile in tubing, patient non-verbal, Alisia Ferrari accompanied patient to room and brought phone numbers of patient's sisters.  No evidence of pain or discomfort.

## 2015-03-05 NOTE — Care Management Note (Signed)
Case Management Note  Patient Details  Name: [name] MRN: [id number] Date of Birth: Sep 20, 1950  Palliative consult has been placed for discussions about GOC. Will cont to follow.   Expected Discharge Date:  03/06/15               Expected Discharge Plan:  Hospice Medical Facility  In-House Referral:  Clinical Social Work, Hospice / Palliative Care  Discharge planning Services  CM Consult  Post Acute Care Choice:  NA Choice offered to:  NA  DME Arranged:    DME Agency:     HH Arranged:    HH Agency:     Status of Service:  In process, will continue to follow  Medicare Important Message Given:  Yes Date Medicare IM Given:    Medicare IM give by:    Date Additional Medicare IM Given:    Additional Medicare Important Message give by:     If discussed at Long Length of Stay Meetings, dates discussed:    Additional Comments:  Malcolm Metro, RN 03/05/2015, 11:26 AM

## 2015-03-05 NOTE — Progress Notes (Addendum)
[facility] PROGRESS NOTE  [name] BJY:[id number] DOB: [birth year] DOA: 03/03/2015 PCP: Colon Branch, MD    Code Status: DO NOT RESUSCITATE Family Communication: Discussed with POA and sister, [name] Tristan Schroeder Disposition Plan: Undetermined.   Consultants:  None  Procedures:  Intubation/mechanical ventilation 03/04/15. Extubated 03/04/15.  BiPAP  Antibiotics:  Vancomycin 03/03/15>> 03/04/15  Zosyn 03/03/15>> 03/04/15  HPI/Subjective: Nursing reports that the patient has been more awake overnight and this morning. When I entered the room, I called patient's name and she opens her eyes and looks around, but does not give direct eye contact  Objective: Filed Vitals:   03/05/15 1200 03/05/15 1300  BP: 99/65 110/72  Pulse:    Temp:    Resp: 24    temperature 97.5. Pulse 76. Respiratory rate 24. Blood pressure 110/72. Oxygen saturation 95% on nasal cannula oxygen.  Intake/Output Summary (Last 24 hours) at 03/05/15 1802 Last data filed at 03/05/15 0800  Gross per 24 hour  Intake      0 ml  Output    650 ml  Net   -650 ml   Filed Weights   03/03/15 1200 03/03/15 1500  Weight: 49.896 kg (110 lb) 49.2 kg (108 lb 7.5 oz)    Exam:   General: 64 year old African-American woman who opens her eyes to voice, but is chronically aphasic.  Cardiovascular: S1, S2, with a soft systolic murmur.  Respiratory: Coarse breath sounds. Breathing nonlabored at rest.  Abdomen: PEG tube in place and currently clamped. No active drainage. Bowel sounds are present with no tenderness or appreciable distention.  Musculoskeletal/extremities: No acute hot red joints. No pedal edema.  Neurologic/psychiatric: She opens her eyes to voice. She does not follow commands, due to severe multiple sclerosis and history of previous stroke and encephalopathic state.   Data Reviewed: Basic Metabolic Panel:  Recent Labs Lab 03/03/15 1135 03/03/15 1202 03/03/15 2030 03/04/15 0102  NA 160*  167* 146* 158*  K 3.7 3.8 3.6 4.0  CL 127* 126* 109 129*  CO2 29  --  20* 23  GLUCOSE 202* 197* 144* 147*  BUN 46* 47* 34* 37*  CREATININE 1.98* 1.90* 1.51* 1.33*  CALCIUM 9.2  --  7.0* 8.2*   Liver Function Tests:  Recent Labs Lab 03/03/15 1135 03/04/15 0300  AST 58* 55*  ALT 14 10*  ALKPHOS 136* 122  BILITOT 0.5 0.7  PROT 7.2 6.2*  ALBUMIN 1.8* 1.6*   No results for input(s): LIPASE, AMYLASE in the last 168 hours. No results for input(s): AMMONIA in the last 168 hours. CBC:  Recent Labs Lab 03/03/15 1135 03/03/15 1202  WBC 11.7*  --   NEUTROABS 5.1  --   HGB 11.2* 12.9  HCT 38.5 38.0  MCV 99.5  --   PLT 40*  --    Cardiac Enzymes: No results for input(s): CKTOTAL, CKMB, CKMBINDEX, TROPONINI in the last 168 hours. BNP (last 3 results) No results for input(s): BNP in the last 8760 hours.  ProBNP (last 3 results) No results for input(s): PROBNP in the last 8760 hours.  CBG:  Recent Labs Lab 03/03/15 1700 03/03/15 1826 03/03/15 2011 03/03/15 2317  GLUCAP 119* 143* 126* 109*    Recent Results (from the past 240 hour(s))  Urine culture     Status: None   Collection Time: 03/03/15 11:23 AM  Result Value Ref Range Status   Specimen Description URINE, CATHETERIZED  Final   Special Requests NONE  Final   Culture   Final  NO GROWTH 1 DAY Performed at [facility]    Report Status 03/04/2015 FINAL  Final  Culture, blood (routine x 2)     Status: None (Preliminary result)   Collection Time: 03/03/15 11:37 AM  Result Value Ref Range Status   Specimen Description BLOOD RIGHT HAND DRAWN BY RN  Final   Special Requests BOTTLES DRAWN AEROBIC ONLY 4CC  Final   Culture NO GROWTH < 24 HOURS  Final   Report Status PENDING  Incomplete  Culture, blood (routine x 2)     Status: None (Preliminary result)   Collection Time: 03/03/15 11:47 AM  Result Value Ref Range Status   Specimen Description BLOOD LEFT ANTECUBITAL  Final   Special Requests BOTTLES  DRAWN AEROBIC ONLY 6CC  Final   Culture NO GROWTH < 24 HOURS  Final   Report Status PENDING  Incomplete  MRSA PCR Screening     Status: None   Collection Time: 03/03/15  2:46 PM  Result Value Ref Range Status   MRSA by PCR NEGATIVE NEGATIVE Final    Comment:        The GeneXpert MRSA Assay (FDA approved for NASAL specimens only), is one component of a comprehensive MRSA colonization surveillance program. It is not intended to diagnose MRSA infection nor to guide or monitor treatment for MRSA infections.      Studies: Dg Chest Port 1 View  03/04/2015  CLINICAL DATA:  64 year old female with endotracheal tube placement. EXAM: PORTABLE CHEST 1 VIEW COMPARISON:  Chest radiograph dated 09/30/2010. FINDINGS: Endotracheal tube with tip approximately 1.7 cm above the carina. The tip of the endotracheal tube while above the carina it is tilted towards the right mainstem bronchus recommend retraction and repositioning by approximately 3-4 cm. Single-view of the chest demonstrates bibasilar interstitial prominence, likely atelectatic changes. Pneumonia is less likely. Clinical correlation and follow-up recommended. No focal consolidation, pleural effusion, or pneumothorax. The cardiac silhouette is within normal limits. The osseous structures are grossly unremarkable. Prior IMPRESSION: Endotracheal tube above the carina. Recommend retraction by approximately 3 - 4 cm for better positioning. Electronically Signed   By: Elgie Collard M.D.   On: 03/04/2015 02:01    Scheduled Meds: . free water  100 mL Per Tube Q6H  . levETIRAcetam  1,000 mg Intravenous Q12H  . levothyroxine  65 mcg Intravenous QAC breakfast  . pantoprazole (PROTONIX) IV  40 mg Intravenous Q12H   Continuous Infusions: . dextrose 5 % and 0.45% NaCl     Assessment and plan:  Principal Problem:   Sepsis (HCC) Active Problems:   HCAP (healthcare-associated pneumonia)   Hypernatremia   Hypotension (arterial)   Acute  respiratory failure with hypoxia (HCC)   UGI bleed   Multiple sclerosis (HCC)   Thrombocytopenia (HCC)   Lactic acidosis   Acute encephalopathy   Seizure disorder (HCC)   Status post insertion of percutaneous endoscopic gastrostomy (PEG) tube (HCC)   AKI (acute kidney injury) (HCC)   Hyperglycemia   Pressure ulcer   Palliative care encounter  See complete detailed note from 03/04/2015.  Due to the patient's poor prognosis, she was extubated with no intent to reintubate her as discussed with her family on 03/04/2015. However, the patient has survived extubation and is approximately stable from a respiratory standpoint. Therefore, the family now desires that the patient receive fluids and restart PEG feedings and to investigate the upper GI leading or bleeding from the PEG tube. She remains a DO NOT RESUSCITATE as discussed with her  sister [name]. If the patient survives the hospitalization, they desire for the patient to return to a skilled nursing facility. Hospice was brought up, but they are not ready for total hospice care yet. In essence, comfort care measures have been rescinded.   Plan: 1. The patient will remain a DO NOT RESUSCITATE. Palliative care was consulted, but the palliative care NP discussed long-term management with the other sister who does not have as much say in the patient's care. I discussed long-term management with [name], who is the primary POA for the patient.  2. Before starting PEG feedings, will instruct the nursing staff to give free water via the PEG tube every 6 hours to see if she can tolerate it. If there is no evidence of GI bleeding or discomfort, will cautiously start trickle tube feedings with Jevity. 3. Will start IV fluids with dextrose added gently. 4. Will discontinue morphine drip and order as needed morphine for pain or increase in work of breathing. Continue oxygen titrated to proximal using saturations greater than 90%. 5. Will restart Keppra IV.  Will restart Synthroid but gives IV for now. 6. Will start IV Protonix every 12 hours. 7. Will recheck chest x-ray to reevaluate for pneumonia. 8. Will recheck laboratory studies tomorrow.  Time spent: 35 minutes including discussion with family.    [facility]  [facility] Pager [phone]. If 7PM-7AM, please contact night-coverage at [email or web address], password [facility] 03/05/2015, 6:02 PM  LOS: 2 days

## 2015-03-05 NOTE — Progress Notes (Signed)
Pt transferred to room 305 via bed by nursing staff. Report given to K. Joseph Art, RN and she verbalized understanding of report.

## 2015-03-06 ENCOUNTER — Inpatient Hospital Stay (HOSPITAL_COMMUNITY): Payer: Medicare Other

## 2015-03-06 DIAGNOSIS — G40909 Epilepsy, unspecified, not intractable, without status epilepticus: Secondary | ICD-10-CM

## 2015-03-06 LAB — PROTIME-INR
INR: 1.27 (ref 0.00–1.49)
PROTHROMBIN TIME: 16.1 s — AB (ref 11.6–15.2)

## 2015-03-06 LAB — CBC
HEMATOCRIT: 36.2 % (ref 36.0–46.0)
HEMOGLOBIN: 10.9 g/dL — AB (ref 12.0–15.0)
MCH: 29.1 pg (ref 26.0–34.0)
MCHC: 30.1 g/dL (ref 30.0–36.0)
MCV: 96.5 fL (ref 78.0–100.0)
Platelets: 14 10*3/uL — CL (ref 150–400)
RBC: 3.75 MIL/uL — AB (ref 3.87–5.11)
RDW: 23.8 % — AB (ref 11.5–15.5)
WBC: 8.1 10*3/uL (ref 4.0–10.5)

## 2015-03-06 LAB — COMPREHENSIVE METABOLIC PANEL
ALBUMIN: 1.6 g/dL — AB (ref 3.5–5.0)
ALT: 14 U/L (ref 14–54)
AST: 54 U/L — AB (ref 15–41)
Alkaline Phosphatase: 94 U/L (ref 38–126)
Anion gap: 3 — ABNORMAL LOW (ref 5–15)
BUN: 49 mg/dL — AB (ref 6–20)
CHLORIDE: 126 mmol/L — AB (ref 101–111)
CO2: 28 mmol/L (ref 22–32)
Calcium: 8.9 mg/dL (ref 8.9–10.3)
Creatinine, Ser: 1.09 mg/dL — ABNORMAL HIGH (ref 0.44–1.00)
GFR calc Af Amer: >60 mL/min (ref >60)
GFR calc non Af Amer: 52 mL/min — ABNORMAL LOW (ref >60)
GLUCOSE: 144 mg/dL — AB (ref 65–99)
POTASSIUM: 3.4 mmol/L — AB (ref 3.5–5.1)
Sodium: 157 mmol/L — ABNORMAL HIGH (ref 135–145)
Total Bilirubin: 0.7 mg/dL (ref 0.3–1.2)
Total Protein: 6 g/dL — ABNORMAL LOW (ref 6.5–8.1)

## 2015-03-06 LAB — APTT: APTT: 29 s (ref 24–37)

## 2015-03-06 LAB — TYPE AND SCREEN
ABO/RH(D): O POS
ANTIBODY SCREEN: NEGATIVE

## 2015-03-06 MED ORDER — LORAZEPAM 2 MG/ML IJ SOLN
1.0000 mg | Freq: Once | INTRAMUSCULAR | Status: AC
  Administered 2015-03-06: 1 mg via INTRAVENOUS
  Filled 2015-03-06: qty 1

## 2015-03-06 MED ORDER — FUROSEMIDE 10 MG/ML IJ SOLN
10.0000 mg | Freq: Once | INTRAMUSCULAR | Status: AC
  Administered 2015-03-06: 10 mg via INTRAVENOUS
  Filled 2015-03-06: qty 2

## 2015-03-06 MED ORDER — JEVITY 1.2 CAL PO LIQD
1000.0000 mL | ORAL | Status: DC
  Administered 2015-03-06: 1000 mL
  Filled 2015-03-06: qty 1000

## 2015-03-06 MED ORDER — POTASSIUM CL IN DEXTROSE 5% 20 MEQ/L IV SOLN
20.0000 meq | INTRAVENOUS | Status: DC
  Administered 2015-03-06 – 2015-03-08 (×3): 20 meq via INTRAVENOUS

## 2015-03-06 MED ORDER — LACOSAMIDE 200 MG/20ML IV SOLN
INTRAVENOUS | Status: AC
  Filled 2015-03-06: qty 20

## 2015-03-06 MED ORDER — SODIUM CHLORIDE 0.9 % IV SOLN
Freq: Once | INTRAVENOUS | Status: AC
  Administered 2015-03-06: 1 mL via INTRAVENOUS

## 2015-03-06 MED ORDER — LORAZEPAM 2 MG/ML IJ SOLN
0.5000 mg | Freq: Once | INTRAMUSCULAR | Status: DC
Start: 2015-03-06 — End: 2015-03-06

## 2015-03-06 MED ORDER — SODIUM CHLORIDE 0.9 % IV SOLN
50.0000 mg | Freq: Two times a day (BID) | INTRAVENOUS | Status: DC
  Administered 2015-03-06: 50 mg via INTRAVENOUS
  Filled 2015-03-06 (×5): qty 5

## 2015-03-06 MED ORDER — METOPROLOL TARTRATE 1 MG/ML IV SOLN: 5.0000 mg | INTRAVENOUS | Status: DC | PRN

## 2015-03-06 MED ORDER — LORAZEPAM 2 MG/ML IJ SOLN: 1.0000 mg | INTRAMUSCULAR | Status: DC | PRN

## 2015-03-06 MED ORDER — SODIUM CHLORIDE 0.9 % IV SOLN
100.0000 mg | Freq: Two times a day (BID) | INTRAVENOUS | Status: DC
  Administered 2015-03-06 – 2015-03-07 (×3): 100 mg via INTRAVENOUS
  Filled 2015-03-06 (×6): qty 10

## 2015-03-06 NOTE — Progress Notes (Signed)
Critical Platelets called from lab, Fisher notified via epage.

## 2015-03-06 NOTE — Progress Notes (Signed)
[facility] PROGRESS NOTE  [name] ZOX:[id number] DOB: [birth year] DOA: 03/03/2015 PCP: Colon Branch, MD    Code Status: DO NOT RESUSCITATE Family Communication: Discussed with POA and sister, [name] Leeson on 03/05/15. Disposition Plan: Undetermined. Family desire that the patient be discharged to a different skilled nursing facility upon discharge.   Consultants:  Brief assessment a palliative care. Signed off.  Procedures:  Intubation/mechanical ventilation 03/04/15. Extubated 03/04/15.  BiPAP  Antibiotics:  Vancomycin 03/03/15>> 03/04/15  Zosyn 03/03/15>> 03/04/15  HPI/Subjective: Patient is chronically aphasic and has severe multiple sclerosis and a previous stroke. However, she opens her eyes to voice and attempts to speak when questions are asked.  Objective: Filed Vitals:   03/06/15 0530 03/06/15 1338  BP: 92/61 108/71  Pulse: 91 92  Temp: 97.4 F (36.3 C) 97.9 F (36.6 C)  Resp: 20 20   temperature 97.9. Oxygen saturation 90%.  Intake/Output Summary (Last 24 hours) at 03/06/15 1452 Last data filed at 03/06/15 1443  Gross per 24 hour  Intake    910 ml  Output    500 ml  Net    410 ml   Filed Weights   03/03/15 1200 03/03/15 1500  Weight: 49.896 kg (110 lb) 49.2 kg (108 lb 7.5 oz)    Exam:   General: 64 year old African-American woman who opens her eyes to voice, but is chronically aphasic.  Cardiovascular: S1, S2, with a soft systolic murmur.  Respiratory: Coarse breath sounds ; decreased breath sounds in the bases Breathing nonlabored at rest.  Abdomen: PEG tube in place site without erythema or purulence ; dressing clean and dry. Bowel sounds are present with no tenderness or appreciable distention.  Musculoskeletal/extremities: No acute hot red joints. No pedal edema.  Neurologic/psychiatric: She opens her eyes to voice. She actually tries to speak , but her speech is severely dysarthric. She does not follow commands, due to severe  multiple sclerosis and history of previous stroke and encephalopathic state.   Data Reviewed: Basic Metabolic Panel:  Recent Labs Lab 03/03/15 1135 03/03/15 1202 03/03/15 2030 03/04/15 0102 03/06/15 0628  NA 160* 167* 146* 158* 157*  K 3.7 3.8 3.6 4.0 3.4*  CL 127* 126* 109 129* 126*  CO2 29  --  20* 23 28  GLUCOSE 202* 197* 144* 147* 144*  BUN 46* 47* 34* 37* 49*  CREATININE 1.98* 1.90* 1.51* 1.33* 1.09*  CALCIUM 9.2  --  7.0* 8.2* 8.9   Liver Function Tests:  Recent Labs Lab 03/03/15 1135 03/04/15 0300 03/06/15 0628  AST 58* 55* 54*  ALT 14 10* 14  ALKPHOS 136* 122 94  BILITOT 0.5 0.7 0.7  PROT 7.2 6.2* 6.0*  ALBUMIN 1.8* 1.6* 1.6*   No results for input(s): LIPASE, AMYLASE in the last 168 hours. No results for input(s): AMMONIA in the last 168 hours. CBC:  Recent Labs Lab 03/03/15 1135 03/03/15 1202 03/06/15 0628  WBC 11.7*  --  8.1  NEUTROABS 5.1  --   --   HGB 11.2* 12.9 10.9*  HCT 38.5 38.0 36.2  MCV 99.5  --  96.5  PLT 40*  --  14*   Cardiac Enzymes: No results for input(s): CKTOTAL, CKMB, CKMBINDEX, TROPONINI in the last 168 hours. BNP (last 3 results) No results for input(s): BNP in the last 8760 hours.  ProBNP (last 3 results) No results for input(s): PROBNP in the last 8760 hours.  CBG:  Recent Labs Lab 03/03/15 1700 03/03/15 1826 03/03/15 2011 03/03/15 2317  GLUCAP 119*  143* 126* 109*    Recent Results (from the past 240 hour(s))  Urine culture     Status: None   Collection Time: 03/03/15 11:23 AM  Result Value Ref Range Status   Specimen Description URINE, CATHETERIZED  Final   Special Requests NONE  Final   Culture   Final    NO GROWTH 1 DAY Performed at [facility]    Report Status 03/04/2015 FINAL  Final  Culture, blood (routine x 2)     Status: None (Preliminary result)   Collection Time: 03/03/15 11:37 AM  Result Value Ref Range Status   Specimen Description BLOOD RIGHT HAND DRAWN BY RN  Final   Special  Requests BOTTLES DRAWN AEROBIC ONLY 4CC  Final   Culture NO GROWTH 3 DAYS  Final   Report Status PENDING  Incomplete  Culture, blood (routine x 2)     Status: None (Preliminary result)   Collection Time: 03/03/15 11:47 AM  Result Value Ref Range Status   Specimen Description BLOOD LEFT ANTECUBITAL  Final   Special Requests BOTTLES DRAWN AEROBIC ONLY 6CC  Final   Culture NO GROWTH 3 DAYS  Final   Report Status PENDING  Incomplete  MRSA PCR Screening     Status: None   Collection Time: 03/03/15  2:46 PM  Result Value Ref Range Status   MRSA by PCR NEGATIVE NEGATIVE Final    Comment:        The GeneXpert MRSA Assay (FDA approved for NASAL specimens only), is one component of a comprehensive MRSA colonization surveillance program. It is not intended to diagnose MRSA infection nor to guide or monitor treatment for MRSA infections.      Studies: Dg Chest 1 View  03/06/2015  CLINICAL DATA:  Pneumonia.  Short of breath. EXAM: CHEST 1 VIEW COMPARISON:  Radiograph 03/04/2015 FINDINGS: The patient is angled in position. Interval extubation. Normal cardiac silhouette. Moderate RIGHT effusion noted. No pneumothorax. No focal airspace disease. Nipple shadow noted on the LEFT. IMPRESSION: 1. Interval extubation. 2. Increase in RIGHT pleural effusion. 3. No overt infiltrate identified. Electronically Signed   By: Genevive Bi M.D.   On: 03/06/2015 08:40    Scheduled Meds: . sodium chloride   Intravenous Once  . free water  100 mL Per Tube Q6H  . lacosamide (VIMPAT) IV  50 mg Intravenous Q12H  . levothyroxine  65 mcg Intravenous QAC breakfast  . pantoprazole (PROTONIX) IV  40 mg Intravenous Q12H   Continuous Infusions: . dextrose 5 % and 0.45% NaCl 1 mL (03/06/15 0825)   Assessment and plan:  Principal Problem:   Sepsis (HCC) Active Problems:   HCAP (healthcare-associated pneumonia)   Hypernatremia   Hypotension (arterial)   Acute respiratory failure with hypoxia (HCC)   UGI  bleed   Multiple sclerosis (HCC)   Thrombocytopenia (HCC)   Lactic acidosis   Acute encephalopathy   Seizure disorder (HCC)   Status post insertion of percutaneous endoscopic gastrostomy (PEG) tube (HCC)   AKI (acute kidney injury) (HCC)   Hyperglycemia   Pressure ulcer   Palliative care encounter  HPI: [name] is a 64 y.o. female with a history of end-stage multiple sclerosis, previous stroke with chronic left-sided hemiparesis, chronic expressive aphasia and dysphagia secondary to severe multiple sclerosis seizure disorder, who presented from [facility] skilled nursing facility with a report of altered mental status. She is s/p PEG tube insertion at [facility] 2 days prior to admission due to progressive dysphagia. She  was also started on Rocephin at the SNF for pneumonia and decubitus ulcer. In the ED, she was febrile with T of 103.1, hypotensive with a SBP ranging from 87-107, tachycardic with heart rate in the 120s to 130s, and with a respiratory rate ranging from 13 to 45. Her Her chest x-ray revealed bibasal infiltrates right greater than left consistent with pneumonia and a small right pleural effusion. Her lab data revealed serum sodium of 167, BUN of 47, creatinine of 1.90, glucose of 197, lactic acid of 3.58, and platelet count of 40. Her UA revealed large hemoglobin and RBCs, but no leukocytes or WBCs (cath specimen). She was admitted for sepsis secondary to HCAP.  1. Sepsis secondary to healthcare acquired pneumonia /ventilator dependent respiratory failure.  Blood cultures were ordered in the ED. She was started on Zosyn and vancomycin. Vigorous IV fluids were started with half-normal saline due to her dehydration, AKI, and severe hypernatremia. All of her chronic medications were withheld with exception of Keppra and Depakote which were given IV.   Several hours after admission, the patient became more hypoxic and tachypneic. She was placed on BiPAP. ABG on BiPAP  revealed pH of 7.36, PCO2 of 46, and PO2 of 121. After several hours on BiPAP, her follow-up ABG worsened. Her lactic acid acid increased Her valproic acid level was therapeutic at 60. Eventually, the ICU E-link physician recommended intubation due to worsening ABG.  The family was updated and agreed with intubation/mechanical ventilation. Dr. Conley Rolls performed intubation successfully. IV hydrocortisone was given for sepsis. IV Lasix was also given empirically. Her blood pressure dropped and she was given a bolus of normal saline. She was then started on pressor support with Levophed. She developed paroxysmal atrial fibrillation with RVR. IV diltiazem was given which improved her heart rate. Several hours later, the patient apparently developed some bleeding from the PEG tube. An NG tube was placed to low intermittent suction with bloody drainage noted. Patient was started on IV Protonix.  her family was updated by Dr. Conley Rolls who explained to them her poor prognosis and possible eminent death. Therefore, the patient's family agreed with discontinuation of  intubation/mechanical ventilation, stopping all drips and medications and to make her comfortable. She was made a DNR and progressed to comfort care measures. Morphine drip was ordered. -Following extubation, the patient stabilized and was breathing without assistance and was oxygenating in the 90s on nasal cannula oxygen. Her blood pressure stabilized in the 100s systolically. -The family rescinded the comfort care measures and wanted the patient to be started on reasonable treatment for conditions that were treatable , but she remains DNR. -Follow-up chest x-ray on 03/06/15 revealed increase in right pleural effusion comment but no overt infiltrate identified. Blood cultures are negative to date. Antibiotics were not restarted.   Hypernatremia. The patient's serum sodium was 160 on admission. She was started on half-normal saline. Her serum sodium improved 146  prior to becoming comfort care. Fluids were discontinued.  Comfort care measures were rescinded. Labs were reordered. Her serum sodium increased to 157.  -Will start D5 with potassium chloride added for treatment. We'll continue to monitor.  Upper GI bleed /acute blood loss anemia. Following intubation, the patient developed bleeding from her PEG tube. NG tube was placed which suctioned bloody drainage. The etiology of her upper GI bleeding was unknown. GI was not consulted in the setting of comfort care measures. The bleeding from the PEG tube resolved. She was restarted on Protonix every 12 hours. Patient's follow-up  platelet count was significantly lower which could have attributed to the bleeding. She has not had a significant drop in her H&H. 11.2 on admission and 10.9 today.  Thrombocytopenia. Patient has a history of thrombocytopenia with a baseline platelet count of 94-123 per chart review. It was 40 on admission. The decrease was thought to be secondary to sepsis. However, both Keppra and Depakote can cause thrombocytopenia. Her vitamin B12 level was actually elevated. Her TSH was within normal limits. Her follow-up INR was  modestly elevated at 16. Her PTT was within normal limits. -Her platelet count fell to 14. 2 units of platelets have been ordered for transfusion. We'll continue to monitor.  Chronic dysphagia , status post PEG tube insertion on 03/01/15 at [facility] Apparently the upper GI bleeding has resolved.Her family wanted PEG feedings restarted.   Water flushes were tried first with no obvious sequelae Therefore, we'll start trickle tube feedings with Jevity without advancement for now.  Long-standing seizure disorder Patient is treated both with Keppra and Depakote. Both were restarted on admission, but were discontinued when she was made comfort care. Keppra was restarted when comfort care was rescinded. However in light of her profound thrombocytopenia, will stop Keppra  and start Vimpat.  Hypothyroidism. Synthroid was restarted IV at half the usual home dose. Her TSH on admission was within normal limits. -Will eventually transition the Synthroid to per PEG.  Hyperglycemia /prediabetes. The patient's venous glucose was greater than 200 on admission. She was started on sliding scale NovoLog , which was discontinued when she was made comfort care. Now that comfort care has been rescinded, will restart sliding scale NovoLog. Her hemoglobin A1c was 6.3.      Total time spent 30 minutes. [facility]  [facility] Pager [phone]. If 7PM-7AM, please contact night-coverage at [email or web address], password [facility] 03/06/2015, 2:52 PM  LOS: 3 days

## 2015-03-06 NOTE — Progress Notes (Signed)
CRITICAL VALUE ALERT  Critical value received:  Platelets  Date of notification:  03/06/15  Time of notification:  0809  Critical value read back:Yes.    Nurse who received alert:  Servando Snare. Joseph Art, RN  MD notified (1st page):  Fisher  Time of first page:  [phone]  MD notified (2nd page):  Time of second page:  Responding MD:  Sherrie Mustache  Time MD responded:  [phone]

## 2015-03-07 LAB — GLUCOSE, CAPILLARY
Glucose-Capillary: 142 mg/dL — ABNORMAL HIGH (ref 65–99)
Glucose-Capillary: 132 mg/dL — ABNORMAL HIGH (ref 65–99)

## 2015-03-07 LAB — CBC
HCT: 29.3 % — ABNORMAL LOW (ref 36.0–46.0)
Hemoglobin: 8.8 g/dL — ABNORMAL LOW (ref 12.0–15.0)
MCH: 28.9 pg (ref 26.0–34.0)
MCHC: 30 g/dL (ref 30.0–36.0)
MCV: 96.4 fL (ref 78.0–100.0)
Platelets: 292 10*3/uL (ref 150–400)
RBC: 3.04 MIL/uL — ABNORMAL LOW (ref 3.87–5.11)
RDW: 23.4 % — ABNORMAL HIGH (ref 11.5–15.5)
WBC: 7.8 10*3/uL (ref 4.0–10.5)

## 2015-03-07 LAB — BASIC METABOLIC PANEL
Anion gap: 6 (ref 5–15)
BUN: 35 mg/dL — ABNORMAL HIGH (ref 6–20)
CO2: 27 mmol/L (ref 22–32)
Calcium: 8.8 mg/dL — ABNORMAL LOW (ref 8.9–10.3)
Chloride: 118 mmol/L — ABNORMAL HIGH (ref 101–111)
Creatinine, Ser: 0.93 mg/dL (ref 0.44–1.00)
GFR calc Af Amer: >60 mL/min (ref >60)
GFR calc non Af Amer: >60 mL/min (ref >60)
Glucose, Bld: 179 mg/dL — ABNORMAL HIGH (ref 65–99)
Potassium: 3.3 mmol/L — ABNORMAL LOW (ref 3.5–5.1)
Sodium: 151 mmol/L — ABNORMAL HIGH (ref 135–145)

## 2015-03-07 LAB — PREPARE PLATELET PHERESIS
UNIT DIVISION: 0
UNIT DIVISION: 0

## 2015-03-07 MED ORDER — JEVITY 1.2 CAL PO LIQD
1000.0000 mL | ORAL | Status: DC
  Administered 2015-03-07: 1000 mL
  Filled 2015-03-07 (×2): qty 1000

## 2015-03-07 MED ORDER — INSULIN GLARGINE 100 UNIT/ML ~~LOC~~ SOLN
10.0000 [IU] | Freq: Every day | SUBCUTANEOUS | Status: DC
  Administered 2015-03-07 – 2015-03-08 (×2): 10 [IU] via SUBCUTANEOUS
  Filled 2015-03-07 (×3): qty 0.1

## 2015-03-07 MED ORDER — LACOSAMIDE 200 MG/20ML IV SOLN
INTRAVENOUS | Status: AC
  Filled 2015-03-07: qty 20

## 2015-03-07 MED ORDER — INSULIN ASPART 100 UNIT/ML ~~LOC~~ SOLN
0.0000 [IU] | SUBCUTANEOUS | Status: DC
  Administered 2015-03-07 – 2015-03-08 (×7): 1 [IU] via SUBCUTANEOUS
  Administered 2015-03-09: 2 [IU] via SUBCUTANEOUS
  Administered 2015-03-09 (×3): 1 [IU] via SUBCUTANEOUS

## 2015-03-07 MED ORDER — POTASSIUM CHLORIDE CRYS ER 20 MEQ PO TBCR
20.0000 meq | EXTENDED_RELEASE_TABLET | Freq: Two times a day (BID) | ORAL | Status: AC
  Administered 2015-03-07 (×2): 20 meq via ORAL
  Filled 2015-03-07 (×3): qty 1

## 2015-03-07 NOTE — Progress Notes (Signed)
[facility] PROGRESS NOTE  [name] LKG:[id number] DOB: 10-09-1950 DOA: 03/03/2015 PCP: Colon Branch, MD    Code Status: DO NOT RESUSCITATE Family Communication: Discussed with POA and sister, [name] on 03/06/15. Disposition Plan: Undetermined. Family desire that the patient be discharged to a different skilled nursing facility upon discharge.   Consultants:  Brief assessment by palliative care. Signed off.  Procedures:  Intubation/mechanical ventilation 03/04/15. Extubated 03/04/15.  BiPAP  Antibiotics:  Vancomycin 03/03/15>> 03/04/15  Zosyn 03/03/15>> 03/04/15  HPI/Subjective: Patient is chronically aphasic and has severe multiple sclerosis and a previous stroke. However, she opens her eyes to voice and attempts to speak when questions are asked. Nursing reports no acute changes overnight.   Objective: Filed Vitals:   03/07/15 0037 03/07/15 0430  BP: 94/57 105/50  Pulse: 76 95  Temp: 97.6 F (36.4 C) 98.1 F (36.7 C)  Resp: 20 20   temperature 97.9. Oxygen saturation 99% on 2 L nasal cannula  Intake/Output Summary (Last 24 hours) at 03/07/15 1412 Last data filed at 03/07/15 0912  Gross per 24 hour  Intake 1131.5 ml  Output    700 ml  Net  431.5 ml   Filed Weights   03/03/15 1200 03/03/15 1500  Weight: 49.896 kg (110 lb) 49.2 kg (108 lb 7.5 oz)    Exam:   General: 64 year old African-American woman who opens her eyes to voice, but is chronically aphasic.  Cardiovascular: S1, S2, with a soft systolic murmur.  Respiratory: Coarse breath sounds ; decreased breath sounds in the bases. Breathing nonlabored at rest.  Abdomen: PEG tube in place site without erythema or purulence ; dressing clean and dry. Bowel sounds are present with no tenderness or appreciable distention.  Musculoskeletal/extremities: No acute hot red joints. No pedal edema.  Neurologic/psychiatric: She is asleep, but opens her eyes to her name. Otherwise, she is  nonresponsive.  Data Reviewed: Basic Metabolic Panel:  Recent Labs Lab 03/03/15 1135 03/03/15 1202 03/03/15 2030 03/04/15 0102 03/06/15 0628 03/07/15 0618  NA 160* 167* 146* 158* 157* 151*  K 3.7 3.8 3.6 4.0 3.4* 3.3*  CL 127* 126* 109 129* 126* 118*  CO2 29  --  20* 23 28 27   GLUCOSE 202* 197* 144* 147* 144* 179*  BUN 46* 47* 34* 37* 49* 35*  CREATININE 1.98* 1.90* 1.51* 1.33* 1.09* 0.93  CALCIUM 9.2  --  7.0* 8.2* 8.9 8.8*   Liver Function Tests:  Recent Labs Lab 03/03/15 1135 03/04/15 0300 03/06/15 0628  AST 58* 55* 54*  ALT 14 10* 14  ALKPHOS 136* 122 94  BILITOT 0.5 0.7 0.7  PROT 7.2 6.2* 6.0*  ALBUMIN 1.8* 1.6* 1.6*   No results for input(s): LIPASE, AMYLASE in the last 168 hours. No results for input(s): AMMONIA in the last 168 hours. CBC:  Recent Labs Lab 03/03/15 1135 03/03/15 1202 03/06/15 0628 03/07/15 0618  WBC 11.7*  --  8.1 7.8  NEUTROABS 5.1  --   --   --   HGB 11.2* 12.9 10.9* 8.8*  HCT 38.5 38.0 36.2 29.3*  MCV 99.5  --  96.5 96.4  PLT 40*  --  14* 292   Cardiac Enzymes: No results for input(s): CKTOTAL, CKMB, CKMBINDEX, TROPONINI in the last 168 hours. BNP (last 3 results) No results for input(s): BNP in the last 8760 hours.  ProBNP (last 3 results) No results for input(s): PROBNP in the last 8760 hours.  CBG:  Recent Labs Lab 03/03/15 1700 03/03/15 1826 03/03/15 2011 03/03/15  2317  GLUCAP 119* 143* 126* 109*    Recent Results (from the past 240 hour(s))  Urine culture     Status: None   Collection Time: 03/03/15 11:23 AM  Result Value Ref Range Status   Specimen Description URINE, CATHETERIZED  Final   Special Requests NONE  Final   Culture   Final    NO GROWTH 1 DAY Performed at [facility]    Report Status 03/04/2015 FINAL  Final  Culture, blood (routine x 2)     Status: None (Preliminary result)   Collection Time: 03/03/15 11:37 AM  Result Value Ref Range Status   Specimen Description BLOOD RIGHT HAND  DRAWN BY RN  Final   Special Requests BOTTLES DRAWN AEROBIC ONLY 4CC  Final   Culture NO GROWTH 3 DAYS  Final   Report Status PENDING  Incomplete  Culture, blood (routine x 2)     Status: None (Preliminary result)   Collection Time: 03/03/15 11:47 AM  Result Value Ref Range Status   Specimen Description BLOOD LEFT ANTECUBITAL  Final   Special Requests BOTTLES DRAWN AEROBIC ONLY 6CC  Final   Culture NO GROWTH 3 DAYS  Final   Report Status PENDING  Incomplete  MRSA PCR Screening     Status: None   Collection Time: 03/03/15  2:46 PM  Result Value Ref Range Status   MRSA by PCR NEGATIVE NEGATIVE Final    Comment:        The GeneXpert MRSA Assay (FDA approved for NASAL specimens only), is one component of a comprehensive MRSA colonization surveillance program. It is not intended to diagnose MRSA infection nor to guide or monitor treatment for MRSA infections.      Studies: Dg Chest 1 View  03/06/2015  CLINICAL DATA:  Pneumonia.  Short of breath. EXAM: CHEST 1 VIEW COMPARISON:  Radiograph 03/04/2015 FINDINGS: The patient is angled in position. Interval extubation. Normal cardiac silhouette. Moderate RIGHT effusion noted. No pneumothorax. No focal airspace disease. Nipple shadow noted on the LEFT. IMPRESSION: 1. Interval extubation. 2. Increase in RIGHT pleural effusion. 3. No overt infiltrate identified. Electronically Signed   By: Genevive Bi M.D.   On: 03/06/2015 08:40    Scheduled Meds: . free water  100 mL Per Tube Q6H  . lacosamide (VIMPAT) IV  100 mg Intravenous Q12H  . levothyroxine  65 mcg Intravenous QAC breakfast  . pantoprazole (PROTONIX) IV  40 mg Intravenous Q12H   Continuous Infusions: . dextrose 5 % with KCl 20 mEq / L 20 mEq (03/07/15 1256)  . feeding supplement (JEVITY 1.2 CAL) 1,000 mL (03/06/15 1515)   Assessment and plan:  Principal Problem:   Sepsis (HCC) Active Problems:   HCAP (healthcare-associated pneumonia)   Hypernatremia   Hypotension  (arterial)   Acute respiratory failure with hypoxia (HCC)   UGI bleed   Multiple sclerosis (HCC)   Thrombocytopenia (HCC)   Lactic acidosis   Acute encephalopathy   Seizure disorder (HCC)   Status post insertion of percutaneous endoscopic gastrostomy (PEG) tube (HCC)   AKI (acute kidney injury) (HCC)   Hyperglycemia   Pressure ulcer   Palliative care encounter  HPI: [name] is a 64 y.o. female with a history of end-stage multiple sclerosis, previous stroke with chronic left-sided hemiparesis, chronic expressive aphasia and dysphagia secondary to severe multiple sclerosis seizure disorder, who presented from [facility] skilled nursing facility with a report of altered mental status. She is s/p PEG tube insertion at [facility]  2 days prior to admission due to progressive dysphagia. She was also started on Rocephin at the SNF for pneumonia and decubitus ulcer. In the ED, she was febrile with T of 103.1, hypotensive with a SBP ranging from 87-107, tachycardic with heart rate in the 120s to 130s, and with a respiratory rate ranging from 13 to 45. Her Her chest x-ray revealed bibasal infiltrates right greater than left consistent with pneumonia and a small right pleural effusion. Her lab data revealed serum sodium of 167, BUN of 47, creatinine of 1.90, glucose of 197, lactic acid of 3.58, and platelet count of 40. Her UA revealed large hemoglobin and RBCs, but no leukocytes or WBCs (cath specimen). She was admitted for sepsis secondary to HCAP.  1. Sepsis secondary to healthcare acquired pneumonia /ventilator dependent respiratory failure.  Blood cultures were ordered in the ED. She was started on Zosyn and vancomycin. Vigorous IV fluids were started with half-normal saline due to her dehydration, AKI, and severe hypernatremia. All of her chronic medications were withheld with exception of Keppra and Depakote which were given IV.   Several hours after admission, the patient became more  hypoxic and tachypneic. She was placed on BiPAP. ABG on BiPAP revealed pH of 7.36, PCO2 of 46, and PO2 of 121. After several hours on BiPAP, her follow-up ABG worsened. Her lactic acid acid increased Her valproic acid level was therapeutic at 60. Eventually, the ICU E-link physician recommended intubation due to worsening ABG.  The family was updated and agreed with intubation/mechanical ventilation. Dr. Conley Rolls performed intubation successfully. IV hydrocortisone was given for sepsis. IV Lasix was also given empirically. Her blood pressure dropped and she was given a bolus of normal saline. She was then started on pressor support with Levophed. She developed paroxysmal atrial fibrillation with RVR. IV diltiazem was given which improved her heart rate. Several hours later, the patient apparently developed some bleeding from the PEG tube. An NG tube was placed to low intermittent suction with bloody drainage noted. Patient was started on IV Protonix.  her family was updated by Dr. Conley Rolls who explained to them her poor prognosis and possible eminent death. Therefore, the patient's family agreed with discontinuation of  intubation/mechanical ventilation, stopping all drips and medications and to make her comfortable. She was made a DNR and progressed to comfort care measures. Morphine drip was ordered. -Following extubation, the patient stabilized and was breathing without assistance and was oxygenating in the 90s on nasal cannula oxygen. Her blood pressure stabilized in the 100s systolically. -The family rescinded the comfort care measures and wanted the patient to be started on reasonable treatment for conditions that were treatable, but she remains a DNR. -Follow-up chest x-ray on 03/06/15 revealed increase in right pleural effusion comment but no overt infiltrate identified. Blood cultures are negative to date. Antibiotics were not restarted. 10 mg of Lasix IV given 1 empirically.   Hypernatremia. The patient's  serum sodium was 160 on admission. She was started on half-normal saline. Her serum sodium improved to 146 prior to becoming comfort care. Fluids were discontinued.  Comfort care measures were rescinded. Labs were reordered. Her serum sodium increased to 157.  -IV fluids with D5 with potassium chloride was started for treatment. Also ordered was free water every 6 hours via the PEG tube.  Upper GI bleed /acute blood loss anemia. Following intubation, the patient developed bleeding from her PEG tube. NG tube was placed which suctioned bloody drainage. The etiology of her upper GI bleeding was  unknown. GI was not consulted in the setting of comfort care measures. The bleeding from the PEG tube resolved. She was restarted on Protonix every 12 hours. Patient's follow-up platelet count was significantly lower which could have attributed to the bleeding. She has had a slow decrease in her hemoglobin, which could be attributable to the hemodilution from free water. There has been no active gross bleeding from the PEG tube or per rectum. -We'll continue to monitor, but no indication for transfusion yet.  Thrombocytopenia. Patient has a history of thrombocytopenia with a baseline platelet count of 94-123 per chart review. It was 40 on admission. The decrease was thought to be secondary to sepsis. However, both Keppra and Depakote can cause thrombocytopenia. Her vitamin B12 level was actually elevated. Her TSH was within normal limits. Her follow-up INR was modestly elevated at 16. Her PTT was within normal limits. -Her platelet count fell to 14.  -Both Keppra and valproic acid were discontinued. She was transfused 2 units of platelets on 03/06/2015. Her platelet count increased from 14 to 292.  We'll continue to monitor.  Chronic dysphagia , status post PEG tube insertion on 03/01/15 at [place] The patient developed upper GI bleeding of unknown etiology shortly following intubation/mechanical  ventilation. She was started on IV Protonix. There has been no further upper GI bleeding via the PEG tube. Her family wanted PEG feedings restarted. Water flushes were tried first with no obvious sequelae Therefore, trickle tube feeding with Jevity was started on 03/06/15. -We'll increase the rate from 20-25 mL in monitor.  Long-standing seizure disorder Patient was treated both with Keppra and Depakote chronically. Both were restarted on admission, but were discontinued when she was made comfort care. Keppra was restarted when comfort care was rescinded. However, in light of her profound thrombocytopenia, Keppra was discontinued in favor of Vimpat.  Hypothyroidism. Synthroid was restarted IV at half the usual home dose. Her TSH on admission was within normal limits. -Will eventually transition the Synthroid to per PEG.  Hyperglycemia /prediabetes. The patient's venous glucose was greater than 200 on admission. She was started on sliding scale NovoLog , which was discontinued when she was made comfort care. Now that comfort care has been rescinded,  sliding scale NovoLog was restarted. Her hemoglobin A1c was 6.3.  Hypokalemia. Her serum potassium has trended downward to 3.3 which may be secondary to hemodilution from free water. We'll start potassium chloride via the PEG tube.      Total time spent 25 minutes. [facility]  [facility] Pager [phone]. If 7PM-7AM, please contact night-coverage at [email or web address], password [facility] 03/07/2015, 2:12 PM  LOS: 4 days

## 2015-03-08 ENCOUNTER — Encounter (HOSPITAL_COMMUNITY): Payer: Self-pay | Admitting: Internal Medicine

## 2015-03-08 DIAGNOSIS — L8990 Pressure ulcer of unspecified site, unspecified stage: Secondary | ICD-10-CM

## 2015-03-08 LAB — GLUCOSE, CAPILLARY
GLUCOSE-CAPILLARY: 125 mg/dL — AB (ref 65–99)
GLUCOSE-CAPILLARY: 127 mg/dL — AB (ref 65–99)
GLUCOSE-CAPILLARY: 135 mg/dL — AB (ref 65–99)
Glucose-Capillary: 117 mg/dL — ABNORMAL HIGH (ref 65–99)
Glucose-Capillary: 135 mg/dL — ABNORMAL HIGH (ref 65–99)
Glucose-Capillary: 135 mg/dL — ABNORMAL HIGH (ref 65–99)

## 2015-03-08 LAB — CBC
HCT: 24.9 % — ABNORMAL LOW (ref 36.0–46.0)
Hemoglobin: 7.7 g/dL — ABNORMAL LOW (ref 12.0–15.0)
MCH: 29.5 pg (ref 26.0–34.0)
MCHC: 30.9 g/dL (ref 30.0–36.0)
MCV: 95.4 fL (ref 78.0–100.0)
PLATELETS: 151 10*3/uL (ref 150–400)
RBC: 2.61 MIL/uL — ABNORMAL LOW (ref 3.87–5.11)
RDW: 23.5 % — AB (ref 11.5–15.5)
WBC: 8.4 10*3/uL (ref 4.0–10.5)

## 2015-03-08 LAB — CULTURE, BLOOD (ROUTINE X 2)
CULTURE: NO GROWTH
Culture: NO GROWTH

## 2015-03-08 LAB — BASIC METABOLIC PANEL
BUN: 24 mg/dL — AB (ref 6–20)
CHLORIDE: 116 mmol/L — AB (ref 101–111)
CO2: 28 mmol/L (ref 22–32)
CREATININE: 0.82 mg/dL (ref 0.44–1.00)
Calcium: 8.2 mg/dL — ABNORMAL LOW (ref 8.9–10.3)
GFR calc Af Amer: >60 mL/min (ref >60)
GFR calc non Af Amer: >60 mL/min (ref >60)
Glucose, Bld: 149 mg/dL — ABNORMAL HIGH (ref 65–99)
POTASSIUM: 3.6 mmol/L (ref 3.5–5.1)
Sodium: 146 mmol/L — ABNORMAL HIGH (ref 135–145)

## 2015-03-08 MED ORDER — JEVITY 1.2 CAL PO LIQD
1000.0000 mL | ORAL | Status: DC
Start: 2015-03-08 — End: 2015-03-09
  Filled 2015-03-08 (×3): qty 1000

## 2015-03-08 MED ORDER — LEVOTHYROXINE SODIUM 25 MCG PO TABS
137.0000 ug | ORAL_TABLET | Freq: Every day | ORAL | Status: DC
  Administered 2015-03-09: 137 ug
  Filled 2015-03-08: qty 1

## 2015-03-08 MED ORDER — LACOSAMIDE 50 MG PO TABS
100.0000 mg | ORAL_TABLET | Freq: Two times a day (BID) | ORAL | Status: DC
  Administered 2015-03-08 – 2015-03-09 (×3): 100 mg
  Filled 2015-03-08 (×3): qty 2

## 2015-03-08 MED ORDER — LEVETIRACETAM 500 MG PO TABS: 500.0000 mg | ORAL_TABLET | Freq: Two times a day (BID) | ORAL | Status: DC

## 2015-03-08 MED ORDER — LEVETIRACETAM 500 MG PO TABS
500.0000 mg | ORAL_TABLET | Freq: Two times a day (BID) | ORAL | Status: DC
  Administered 2015-03-08 – 2015-03-09 (×3): 500 mg via ORAL
  Filled 2015-03-08 (×4): qty 1

## 2015-03-08 NOTE — Progress Notes (Signed)
Initial Nutrition Assessment  DOCUMENTATION CODES:   Severe malnutrition in context of chronic illness  INTERVENTION:  Continuous Jevity 1.2 @ 25 ml/hr via PEG and increase  to rate of 30 ml/hr.   Tube feeding regimen provides 864 kcal, 40 grams of protein, and 580 ml of H2O.    Recommend Obtain current weight and place on daily weights due BLE edema and start of tube feeding  Monitor magnesium, potassium, and phosphorus daily for at least 3 days, MD to replete as needed, as pt is at risk for refeeding syndrome given pt  hypocaloric intake and admission from skilled facility.  Initiate adult enteral feeding protocol  NUTRITION DIAGNOSIS:   Inadequate oral intake related to dysphagia as evidenced by NPO status.  GOAL:  Pt to meet >/= 90% of their estimated nutrition needs given her current health-care wishes.      MONITOR:   TF tolerance, Labs, Weight trends, Skin, I & O's  REASON FOR ASSESSMENT:   Consult Enteral/tube feeding initiation and management  ASSESSMENT: Pt is from [facility]. She has chronic expressive aphagia and end-stage multiple sclerosis. Initially pt had PEG tube placed at [facility] on 12/5 due to increased swallow difficulty. Altered mental status and febrile on admission.  Pt was intubated then extubated on 12/8. Palliative consult 12/9. Family not accepting of Hospice and have decided to move forward with enteral feeding. Tube feeding delayed due to gastric bleeding. She was cleared for enteral feeding and Jevity 1.2 was started conservatively @ 20 ml/hr. Agree with gradual advancement of feeding due to risk of refeeding. Current goal caloric intake 20 kcal/kg.  EN: Jevity at 25 ml /hr providing:  720 kcal, 33 gr protein, 483 ml water. She is tolerating well per nursing. Will advance slightly.  Abnormal labs: glucose 135, Sodium 146 and H/H-7.7/24.9.  IV-fluids : D5 with potassium @ 70 ml/hr since 12/10.  Diet Order:   NPO  Skin:   Stage III  to sacrum and stage III to right gluteal fold  Last BM:   12/11  Height:   Ht Readings from Last 1 Encounters:  03/03/15 5' (1.524 m)    Weight:   Wt Readings from Last 1 Encounters:  03/03/15 108 lb 7.5 oz (49.2 kg)    Adjusted Ideal Body Weight:   43 kg  BMI:  Body mass index is 21.18 kg/(m^2).  Estimated Nutritional Needs:   Kcal:   1000 kcal (limited due to refeeding risk)  Protein:   63-73 gr  Fluid:   1.2-1.4 liters daily  EDUCATION NEEDS:   No education needs identified at this time   Royann Shivers MS,RD,CSG,LDN Office: #[phone] Pager: [phone]

## 2015-03-08 NOTE — Progress Notes (Addendum)
[facility] PROGRESS NOTE  [name]:[id number] DOB: 1950/09/10 DOA: 03/03/2015 PCP: Colon Branch, MD    Code Status: DO NOT RESUSCITATE Family Communication: Discussed with POA and sister, [name] on 03/06/15-discussion lasted approximately 30 minutes. Updated on 03/07/15. Disposition Plan: Undetermined. Family desire that the patient be discharged to a different skilled nursing facility upon discharge.   Consultants:  Curbside with Dr. Gerilyn Pilgrim, neurologist.  Brief assessment by palliative care. Signed off.  Procedures:  Intubation/mechanical ventilation 03/04/15. Extubated 03/04/15.  BiPAP  Antibiotics:  Vancomycin 03/03/15>> 03/04/15  Zosyn 03/03/15>> 03/04/15  HPI/Subjective: Nursing reports no new changes overnight. She appears to be tolerating the tube feedings. No evidence of bright red blood in her stools or bloody drainage from the PEG Patient is chronically aphasic and has severe multiple sclerosis and a previous stroke. However, she opens her eyes to her name.   Objective: Filed Vitals:   03/08/15 0019 03/08/15 0419  BP: 91/60 84/53  Pulse: 93 97  Temp: 99 F (37.2 C) 98.8 F (37.1 C)  Resp: 20 20   temperature 97.9. Oxygen saturation 96% on 2 L nasal cannula  Intake/Output Summary (Last 24 hours) at 03/08/15 1310 Last data filed at 03/08/15 0427  Gross per 24 hour  Intake      0 ml  Output    450 ml  Net   -450 ml   Filed Weights   03/03/15 1200 03/03/15 1500 03/08/15 1308  Weight: 49.896 kg (110 lb) 49.2 kg (108 lb 7.5 oz) 53.887 kg (118 lb 12.8 oz)    Exam:   General: 64 year old African-American woman who opens her eyes to voice, but is chronically aphasic.  Cardiovascular: S1, S2, with a soft systolic murmur.  Respiratory: Coarse breath sounds ; decreased breath sounds in the bases. Breathing nonlabored at rest.  Abdomen: PEG tube in place site without erythema or purulence ; dressing clean and dry. Bowel sounds are  present with no tenderness or appreciable distention.  Musculoskeletal/extremities: No acute hot red joints. No pedal edema.  Neurologic/psychiatric: She opens her eyes to voice and moans a little; not interactive.  Skin: Foam bandages on place on the sacrum; not taken off. Nursing reports to stage 3 sacral ulcer with no infected features.  Data Reviewed: Basic Metabolic Panel:  Recent Labs Lab 03/03/15 2030 03/04/15 0102 03/06/15 0628 03/07/15 0618 03/08/15 0544  NA 146* 158* 157* 151* 146*  K 3.6 4.0 3.4* 3.3* 3.6  CL 109 129* 126* 118* 116*  CO2 20* GLUCOSE 144* 147* 144* 179* 149*  BUN 34* 37* 49* 35* 24*  CREATININE 1.51* 1.33* 1.09* 0.93 0.82  CALCIUM 7.0* 8.2* 8.9 8.8* 8.2*   Liver Function Tests:  Recent Labs Lab 03/03/15 1135 03/04/15 0300 03/06/15 0628  AST 58* 55* 54*  ALT 14 10* 14  ALKPHOS 136* 122 94  BILITOT 0.5 0.7 0.7  PROT 7.2 6.2* 6.0*  ALBUMIN 1.8* 1.6* 1.6*   No results for input(s): LIPASE, AMYLASE in the last 168 hours. No results for input(s): AMMONIA in the last 168 hours. CBC:  Recent Labs Lab 03/03/15 1135 03/03/15 1202 03/06/15 0628 03/07/15 0618 03/08/15 0544  WBC 11.7*  --  8.1 7.8 8.4  NEUTROABS 5.1  --   --   --   --   HGB 11.2* 12.9 10.9* 8.8* 7.7*  HCT 38.5 38.0 36.2 29.3* 24.9*  MCV 99.5  --  96.5 96.4 95.4  PLT 40*  --  14* 292  151   Cardiac Enzymes: No results for input(s): CKTOTAL, CKMB, CKMBINDEX, TROPONINI in the last 168 hours. BNP (last 3 results) No results for input(s): BNP in the last 8760 hours.  ProBNP (last 3 results) No results for input(s): PROBNP in the last 8760 hours.  CBG:  Recent Labs Lab 03/07/15 1816 03/07/15 2013 03/08/15 0008 03/08/15 0422 03/08/15 0756  GLUCAP 142* 132* 135* 125* 135*    Recent Results (from the past 240 hour(s))  Urine culture     Status: None   Collection Time: 03/03/15 11:23 AM  Result Value Ref Range Status   Specimen Description URINE,  CATHETERIZED  Final   Special Requests NONE  Final   Culture   Final    NO GROWTH 1 DAY Performed at [facility]    Report Status 03/04/2015 FINAL  Final  Culture, blood (routine x 2)     Status: None   Collection Time: 03/03/15 11:37 AM  Result Value Ref Range Status   Specimen Description BLOOD RIGHT HAND DRAWN BY RN  Final   Special Requests BOTTLES DRAWN AEROBIC ONLY 4CC  Final   Culture NO GROWTH 5 DAYS  Final   Report Status 03/08/2015 FINAL  Final  Culture, blood (routine x 2)     Status: None   Collection Time: 03/03/15 11:47 AM  Result Value Ref Range Status   Specimen Description BLOOD LEFT ANTECUBITAL  Final   Special Requests BOTTLES DRAWN AEROBIC ONLY 6CC  Final   Culture NO GROWTH 5 DAYS  Final   Report Status 03/08/2015 FINAL  Final  MRSA PCR Screening     Status: None   Collection Time: 03/03/15  2:46 PM  Result Value Ref Range Status   MRSA by PCR NEGATIVE NEGATIVE Final    Comment:        The GeneXpert MRSA Assay (FDA approved for NASAL specimens only), is one component of a comprehensive MRSA colonization surveillance program. It is not intended to diagnose MRSA infection nor to guide or monitor treatment for MRSA infections.      Studies: No results found.  Scheduled Meds: . feeding supplement (JEVITY 1.2 CAL)  1,000 mL Per Tube Q24H  . free water  100 mL Per Tube Q6H  . insulin aspart  0-9 Units Subcutaneous 6 times per day  . insulin glargine  10 Units Subcutaneous QHS  . lacosamide  100 mg Per Tube BID  . levothyroxine  65 mcg Intravenous QAC breakfast  . pantoprazole (PROTONIX) IV  40 mg Intravenous Q12H   Continuous Infusions: . dextrose 5 % with KCl 20 mEq / L 20 mEq (03/07/15 1256)   Assessment and plan:  Principal Problem:   Sepsis (HCC) Active Problems:   HCAP (healthcare-associated pneumonia)   Hypernatremia   Hypotension (arterial)   Acute respiratory failure with hypoxia (HCC)   UGI bleed   Multiple sclerosis  (HCC)   Thrombocytopenia (HCC)   Lactic acidosis   Acute encephalopathy   Seizure disorder (HCC)   Status post insertion of percutaneous endoscopic gastrostomy (PEG) tube (HCC)   AKI (acute kidney injury) (HCC)   Hyperglycemia   Pressure ulcer   Palliative care encounter  HPI: [name] is a 64 y.o. female with a history of end-stage multiple sclerosis, previous stroke with chronic left-sided hemiparesis, chronic expressive aphasia and dysphagia secondary to severe multiple sclerosis seizure disorder, who presented from [facility] skilled nursing facility with a report of altered mental status. She is s/p PEG tube  insertion at [facility] 2 days prior to admission due to progressive dysphagia. She was also started on Rocephin at the SNF for pneumonia and decubitus ulcer. In the ED, she was febrile with T of 103.1, hypotensive with a SBP ranging from 87-107, tachycardic with heart rate in the 120s to 130s, and with a respiratory rate ranging from 13 to 45. Her Her chest x-ray revealed bibasal infiltrates right greater than left consistent with pneumonia and a small right pleural effusion. Her lab data revealed serum sodium of 167, BUN of 47, creatinine of 1.90, glucose of 197, lactic acid of 3.58, and platelet count of 40. Her UA revealed large hemoglobin and RBCs, but no leukocytes or WBCs (cath specimen). She was admitted for sepsis secondary to HCAP.  1. Sepsis secondary to healthcare acquired pneumonia /ventilator dependent respiratory failure.  Blood cultures were ordered in the ED. She was started on Zosyn and vancomycin. Vigorous IV fluids were started with half-normal saline due to her dehydration, AKI, and severe hypernatremia. All of her chronic medications were withheld with exception of Keppra and Depakote which were given IV.   Several hours after admission, the patient became more hypoxic and tachypneic. She was placed on BiPAP. ABG on BiPAP revealed pH of 7.36, PCO2 of 46,  and PO2 of 121. After several hours on BiPAP, her follow-up ABG worsened. Her lactic acid acid increased Her valproic acid level was therapeutic at 60. Eventually, the ICU E-link physician recommended intubation due to worsening ABG.  The family was updated and agreed with intubation/mechanical ventilation. Dr. Conley Rolls performed intubation successfully. IV hydrocortisone was given for sepsis. IV Lasix was also given empirically. Her blood pressure dropped and she was given a bolus of normal saline. She was then started on pressor support with Levophed. She developed paroxysmal atrial fibrillation with RVR. IV diltiazem was given which improved her heart rate. Several hours later, the patient apparently developed some bleeding from the PEG tube. An NG tube was placed to low intermittent suction with bloody drainage noted. Patient was started on IV Protonix.  her family was updated by Dr. Conley Rolls who explained to them her poor prognosis and possible eminent death. Therefore, the patient's family agreed with discontinuation of  intubation/mechanical ventilation, stopping all drips and medications and to make her comfortable. She was made a DNR and progressed to comfort care measures. Morphine drip was ordered. -Following extubation, the patient stabilized and was breathing without assistance and was oxygenating in the 90s on nasal cannula oxygen. Her blood pressure stabilized in the 100s systolically. -The family rescinded the comfort care measures and wanted the patient to be started on reasonable treatment for conditions that were treatable, but she remains a DNR. -Follow-up chest x-ray on 03/06/15 revealed increase in right pleural effusion comment but no overt infiltrate identified. Blood cultures are negative to date. Antibiotics were not restarted. 10 mg of Lasix IV given 1 empirically.   Hypernatremia. The patient's serum sodium was 160 on admission. She was started on half-normal saline. Her serum sodium improved  to 146 prior to becoming comfort care. Fluids were discontinued.  Comfort care measures were rescinded. Labs were reordered. Her serum sodium increased to 157.  -IV fluids with D5 with potassium chloride was started for treatment. Also ordered was free water every 6 hours via the PEG tube. -Her serum sodium is improving progressively. 146 today.  Upper GI bleed /acute blood loss anemia. Following intubation, the patient developed bleeding from her PEG tube. NG tube was placed  which suctioned bloody drainage. The etiology of her upper GI bleeding was unknown. GI was not consulted in the setting of comfort care measures. The bleeding from the PEG tube resolved. She was restarted on Protonix IV every 12 hours. Patient's follow-up platelet count was significantly lower which could have attributed to the bleeding. She has had a slow decrease in her hemoglobin, which could be attributable to the hemodilution from free water. There has been no new active gross bleeding from the PEG tube or per rectum. -Consider some type of bone marrow insult as possible etiology. -We'll continue to monitor, but no indication for transfusion yet. -We'll change Protonix to per PEG.  Thrombocytopenia. Patient has a history of thrombocytopenia with a baseline platelet count of 94-123 per chart review. It was 40 on admission. The decrease was thought to be secondary to sepsis. However, both Keppra and Depakote can cause thrombocytopenia. Her vitamin B12 level was actually elevated. Her TSH was within normal limits. Her follow-up INR was modestly elevated at 16. Her PTT was within normal limits. -Her platelet count fell to 14.  -Both Keppra and valproic acid were discontinued. She was transfused 2 units of platelets on 03/06/2015. Her platelet count increased from 14 to 292. Today 151. We'll continue to monitor.  Chronic dysphagia , status post PEG tube insertion on 03/01/15 at [facility] The patient developed upper GI  bleeding of unknown etiology shortly following intubation/mechanical ventilation. She was started on IV Protonix. There has been no further upper GI bleeding via the PEG tube. Her family wanted PEG feedings restarted. Water flushes were tried first with no obvious sequelae Therefore, trickle tube feeding with Jevity was started on 03/06/15. -Rate increased  from 20-25 mL in monitor. RD consulted for further recommendations for rate and nutritional needs.  Long-standing seizure disorder Patient was treated both with Keppra and Depakote chronically. Both were restarted on admission, but were discontinued when she was made comfort care. Keppra was restarted when comfort care was rescinded. However, in light of her profound thrombocytopenia, Keppra was discontinued in favor of Vimpat. -The patient may have had a mild tonic-clonic seizure on 03/07/15. -Discussed with Dr. Gerilyn Pilgrim who stated that thrombocytopenia with Keppra is rare, and likely the insult was from long-standing Depakote. He recommended restarting Keppra at a lower dose-500 mg twice a day and continuing Vimpat at the same dose.  Hypothyroidism. Synthroid was restarted IV at half the usual home dose. Her TSH on admission was within normal limits. -Will transition the Synthroid to per PEG.  Hyperglycemia /prediabetes. The patient's venous glucose was greater than 200 on admission. She was started on sliding scale NovoLog , which was discontinued when she was made comfort care. Now that comfort care has been rescinded,  sliding scale NovoLog was restarted. Her hemoglobin A1c was 6.3.  Hypokalemia. Her serum potassium has trended downward to 3.3 which may be secondary to hemodilution from free water. Started potassium chloride via the PEG tube. Her potassium level is better.  Stage III sacral ulcer. Nursing is providing wound care and frequent turning. Nurse reports no infective-like features.     Total time spent 25  minutes. [facility]  [facility] Pager [phone]. If 7PM-7AM, please contact night-coverage at [email or web address], password [facility] 03/08/2015, 1:10 PM  LOS: 5 days

## 2015-03-08 NOTE — Clinical Social Work Placement (Signed)
   CLINICAL SOCIAL WORK PLACEMENT  NOTE  Date:  03/08/2015  Patient Details  Name: [name] MRN: [id number] Date of Birth: Jan 04, 1951  Clinical Social Work is seeking post-discharge placement for this patient at the Skilled  Nursing Facility level of care (*CSW will initial, date and re-position this form in  chart as items are completed):  Yes   Patient/family provided with [facility] Clinical Social Work Department's list of facilities offering this level of care within the geographic area requested by the patient (or if unable, by the patient's family).  Yes   Patient/family informed of their freedom to choose among providers that offer the needed level of care, that participate in Medicare, Medicaid or managed care program needed by the patient, have an available bed and are willing to accept the patient.  Yes   Patient/family informed of [facility]'s ownership interest in [facility] and [facility], as well as of the fact that they are under no obligation to receive care at these facilities.  PASRR submitted to EDS on 03/08/15     PASRR number received on       Existing PASRR number confirmed on       FL2 transmitted to all facilities in geographic area requested by pt/family on 03/08/15     FL2 transmitted to all facilities within larger geographic area on       Patient informed that his/her managed care company has contracts with or will negotiate with certain facilities, including the following:            Patient/family informed of bed offers received.  Patient chooses bed at       Physician recommends and patient chooses bed at      Patient to be transferred to   on  .  Patient to be transferred to facility by       Patient family notified on   of transfer.  Name of family member notified:        PHYSICIAN       Additional Comment:    _______________________________________________ Karn Cassis, LCSW 03/08/2015, 11:23  AM [phone]

## 2015-03-08 NOTE — Clinical Social Work Note (Signed)
CSW met with pt's sister [name] to discuss d/c plan. She requests new SNF. CSW discussed placement process and provided SNF list. [name] requests [facility] or [facility] and has a few other facilities in mind and she will call CSW with this information later. CSW submitted pasarr as no pasarr listed in system.   Benay Pike, [place]

## 2015-03-08 NOTE — NC FL2 (Signed)
[place] MEDICAID FL2 LEVEL OF CARE SCREENING TOOL     IDENTIFICATION  Patient Name: [name] Birthdate: Oct 20, 1950 Sex: female Admission Date (Current Location): 03/03/2015  [place] and [place] Number: [name]  [id number] Facility and Address:  [facility],  [address] [place] [address]      Provider Number:    Attending Physician Name and Address:  Elliot Cousin, MD  Relative Name and Phone Number:       Current Level of Care: Hospital Recommended Level of Care: Skilled Nursing Facility Prior Approval Number:    Date Approved/Denied:   PASRR Number:    Discharge Plan: SNF    Current Diagnoses: Patient Active Problem List   Diagnosis Date Noted  . Palliative care encounter   . UGI bleed 03/04/2015  . Acute respiratory failure with hypoxia (HCC) 03/04/2015  . Septic shock (HCC) 03/03/2015  . HCAP (healthcare-associated pneumonia) 03/03/2015  . Lactic acidosis 03/03/2015  . Hypernatremia 03/03/2015  . Acute encephalopathy 03/03/2015  . Seizure disorder (HCC) 03/03/2015  . Status post insertion of percutaneous endoscopic gastrostomy (PEG) tube (HCC) 03/03/2015  . AKI (acute kidney injury) (HCC) 03/03/2015  . Sepsis (HCC) 03/03/2015  . Hypotension (arterial) 03/03/2015  . Hyperglycemia 03/03/2015  . Pressure ulcer 03/03/2015  . Bradycardia 12/01/2011  . Dysphagia 12/01/2011  . Chronic pain syndrome 12/01/2011  . Altered mental status 11/30/2011  . Lethargy 11/30/2011  . Decreased responsiveness 11/30/2011  . Thrombocytopenia (HCC) 09/08/2011  . Multiple sclerosis (HCC)   . Seizures (HCC)   . Hemiparesis, left (HCC)   . Hypertension   . Hypothyroid   . Depression   . Aphasia   . Obesity   . Muscle weakness     Orientation RESPIRATION BLADDER Height & Weight     (unable to assess)  O2 (2L) Indwelling catheter 5' (152.4 cm) 108 lbs.  BEHAVIORAL SYMPTOMS/MOOD NEUROLOGICAL BOWEL NUTRITION STATUS  Other (Comment) (n/a)  Convulsions/Seizures (history) Incontinent Feeding tube  AMBULATORY STATUS COMMUNICATION OF NEEDS Skin   Total Care Non-Verbally (Pt occasionally speaks per sister) Skin abrasions, PU Stage and Appropriate Care (skin tear to right ear.)     PU Stage 3 Dressing: Daily (to sacrum and right buttocks. Silicone dressing changes)                 Personal Care Assistance Level of Assistance  Total care           Functional Limitations Info  Sight, Hearing Sight Info: Adequate Hearing Info: Adequate Speech Info: Impaired    SPECIAL CARE FACTORS FREQUENCY                       Contractures Contractures Info: Present    Additional Factors Info  Insulin Sliding Scale Code Status Info: DNR Allergies Info: No known allergies Psychotropic Info: Ativan Insulin Sliding Scale Info: 6x daily       Current Medications (03/08/2015):  This is the current hospital active medication list Current Facility-Administered Medications  Medication Dose Route Frequency Provider Last Rate Last Dose  . dextrose 5 % with KCl 20 mEq / L  infusion  20 mEq Intravenous Continuous Elliot Cousin, MD 70 mL/hr at 03/07/15 1256 20 mEq at 03/07/15 1256  . feeding supplement (JEVITY 1.2 CAL) liquid 1,000 mL  1,000 mL Per Tube Continuous Elliot Cousin, MD 25 mL/hr at 03/07/15 1515 1,000 mL at 03/07/15 1515  . free water 100 mL  100 mL Per Tube Q6H Elliot Cousin, MD  100 mL at 03/08/15 1001  . insulin aspart (novoLOG) injection 0-9 Units  0-9 Units Subcutaneous 6 times per day Elliot Cousin, MD   1 Units at 03/08/15 1001  . insulin glargine (LANTUS) injection 10 Units  10 Units Subcutaneous QHS Elliot Cousin, MD   10 Units at 03/07/15 2114  . lacosamide (VIMPAT) oral solution 100 mg  100 mg Per Tube BID Elliot Cousin, MD      . levothyroxine (SYNTHROID, LEVOTHROID) injection 65 mcg  65 mcg Intravenous QAC breakfast Elliot Cousin, MD   65 mcg at 03/08/15 1001  . metoprolol (LOPRESSOR) injection 5 mg  5 mg  Intravenous Q4H PRN Elliot Cousin, MD      . morphine 2 MG/ML injection 2 mg  2 mg Intravenous Q2H PRN Elliot Cousin, MD   2 mg at 03/06/15 1848  . pantoprazole (PROTONIX) injection 40 mg  40 mg Intravenous Q12H Elliot Cousin, MD   40 mg at 03/08/15 1001     Discharge Medications: Please see discharge summary for a list of discharge medications.  Relevant Imaging Results:  Relevant Lab Results:   Additional Information From SNF long term.   Derenda Fennel [place], [place] [phone]

## 2015-03-09 DIAGNOSIS — L89153 Pressure ulcer of sacral region, stage 3: Secondary | ICD-10-CM

## 2015-03-09 LAB — BASIC METABOLIC PANEL
Anion gap: 7 (ref 5–15)
BUN: 17 mg/dL (ref 6–20)
CO2: 24 mmol/L (ref 22–32)
Calcium: 8.2 mg/dL — ABNORMAL LOW (ref 8.9–10.3)
Chloride: 108 mmol/L (ref 101–111)
Creatinine, Ser: 0.79 mg/dL (ref 0.44–1.00)
Glucose, Bld: 156 mg/dL — ABNORMAL HIGH (ref 65–99)
POTASSIUM: 4.1 mmol/L (ref 3.5–5.1)
Sodium: 139 mmol/L (ref 135–145)

## 2015-03-09 LAB — CBC
HEMATOCRIT: 39.1 % (ref 36.0–46.0)
HEMOGLOBIN: 12.5 g/dL (ref 12.0–15.0)
MCH: 29.4 pg (ref 26.0–34.0)
MCHC: 31.5 g/dL (ref 30.0–36.0)
MCV: 93.3 fL (ref 78.0–100.0)
Platelets: 104 10*3/uL — ABNORMAL LOW (ref 150–400)
RBC: 4.19 MIL/uL (ref 3.87–5.11)
RDW: 24.1 % — ABNORMAL HIGH (ref 11.5–15.5)
WBC: 14.4 10*3/uL — ABNORMAL HIGH (ref 4.0–10.5)

## 2015-03-09 LAB — GLUCOSE, CAPILLARY
GLUCOSE-CAPILLARY: 133 mg/dL — AB (ref 65–99)
GLUCOSE-CAPILLARY: 142 mg/dL — AB (ref 65–99)
Glucose-Capillary: 162 mg/dL — ABNORMAL HIGH (ref 65–99)
Glucose-Capillary: 134 mg/dL — ABNORMAL HIGH (ref 65–99)

## 2015-03-09 MED ORDER — LACOSAMIDE 100 MG PO TABS: 100.0000 mg | ORAL_TABLET | Freq: Two times a day (BID) | ORAL | Status: AC

## 2015-03-09 MED ORDER — FREE WATER: 100.0000 mL | Freq: Four times a day (QID) | Status: DC

## 2015-03-09 MED ORDER — INSULIN ASPART 100 UNIT/ML ~~LOC~~ SOLN: SUBCUTANEOUS | Status: DC

## 2015-03-09 MED ORDER — LEVETIRACETAM 500 MG PO TABS: 500.0000 mg | ORAL_TABLET | Freq: Two times a day (BID) | ORAL | Status: AC

## 2015-03-09 MED ORDER — JEVITY 1.2 CAL PO LIQD: 1000.0000 mL | ORAL | Status: DC

## 2015-03-09 MED ORDER — LORAZEPAM 2 MG/ML PO CONC: 1.0000 mg | ORAL | Status: DC | PRN

## 2015-03-09 MED ORDER — INSULIN GLARGINE 100 UNIT/ML ~~LOC~~ SOLN: 10.0000 [IU] | Freq: Every day | SUBCUTANEOUS | Status: DC

## 2015-03-09 MED ORDER — PANTOPRAZOLE SODIUM 40 MG PO PACK: PACK | ORAL | Status: DC

## 2015-03-09 MED ORDER — OXYCODONE HCL 5 MG PO TABS: 5.0000 mg | ORAL_TABLET | Freq: Three times a day (TID) | ORAL | Status: DC

## 2015-03-09 NOTE — Care Management Note (Signed)
Case Management Note  Patient Details  Name: [name] MRN: [id number] Date of Birth: 1950/09/23  Pt has improved medically and DC is anticipated today. Pt's sister has requested pt be found new facility. CSW is working with pt's sister on options. Pt will DC to SNF. No CM needs.   Expected Discharge Date:  03/06/15               Expected Discharge Plan:  Skilled Nursing Facility  In-House Referral:  Clinical Social Work  Discharge planning Services  CM Consult  Post Acute Care Choice:  NA Choice offered to:  NA  DME Arranged:    DME Agency:     HH Arranged:    HH Agency:     Status of Service:  Completed, signed off  Medicare Important Message Given:  Yes Date Medicare IM Given:    Medicare IM give by:    Date Additional Medicare IM Given:    Additional Medicare Important Message give by:     If discussed at Long Length of Stay Meetings, dates discussed:  03/09/2015  Additional Comments:  Malcolm Metro, RN 03/09/2015, 10:50 AM

## 2015-03-09 NOTE — Progress Notes (Addendum)
Patient discharging back to [facility].  IV's removed - WNL.  PEG flushed and disconnected from feeding and capped, tube patent.  Report called and given to Daleen Snook at facility.    Bed found at [place] last minute,  Report given to [place] at [place].

## 2015-03-09 NOTE — Care Management Important Message (Signed)
Important Message  Patient Details  Name: [name] MRN: [id number] Date of Birth: 12/24/1950   Medicare Important Message Given:  Yes    Malcolm Metro, RN 03/09/2015, 10:50 AM

## 2015-03-09 NOTE — Clinical Social Work Note (Addendum)
CSW met with patient's sister, Raychell Holcomb, and advised that patient had not received any bed offers from the multiple facilities that she was faxed out to. Ms. Rogoff was agreeable for patient to go back to [facility]. She advised that she had hoped that a facility in [place] or [place] would accept patient but she understands that did not.  Ms. Pat stated that she has spoken with family members who have agreed to visit with patient on a weekly basis in the facility to ensure appropriate care.  CSW spoke with Claiborne Billings at [facility] and advised that patient was being discharged today.   Ihor Gully, [place] [phone]

## 2015-03-09 NOTE — Discharge Summary (Signed)
Physician Discharge Summary  [name] ZOX:[id number] DOB: 1950/07/29 DOA: 03/03/2015  PCP: Colon Branch, MD  Admit date: 03/03/2015 Discharge date: 03/09/2015  Time spent: Greater than 30 minutes  Recommendations for Outpatient Follow-up:  1. Please provide wound care and offloading for sacral ulcer daily. 2. Please check the patient's blood sugars at least twice daily and cover with sliding scale NovoLog.  3. Patient was discharged to [facility] skilled nursing facility. 4. Recommend rechecking the patient's hemoglobin/hematocrit, platelet count, magnesium, potassium, and phosphorus in 3-5 days.   Discharge Diagnoses:  1. Presumed sepsis secondary to healthcare acquired pneumonia; required intubation/mechanical ventilation. Extubated successfully. 2. Comfort care measures rescinded as the patient survived extubation. 3. Hypernatremia and acute kidney injury secondary to dehydration. 4. Upper GI bleed with acute blood loss anemia. 5. Thrombocytopenia of all known etiology, but presumed to be Depakote. 6. Chronic dysphagia, status post PEG tube insertion on 03/01/15 at [facility]. 7. Long-standing seizure disorder. 8. History of previous stroke with left-sided hemiparesis. 9. Hypothyroidism. 10. Hyperglycemia secondary to prediabetes. Hemoglobin A1c was 6.3. 11. Hypokalemia. 12. Stage III sacral ulcer. 13. End-stage multiple sclerosis. 14. DO NOT RESUSCITATE.   Discharge Condition: Improved, but chronically ill.  Diet recommendation: Jevity tube feeding.  Filed Weights   03/03/15 1500 03/08/15 1308 03/09/15 0411  Weight: 49.2 kg (108 lb 7.5 oz) 53.887 kg (118 lb 12.8 oz) 55.702 kg (122 lb 12.8 oz)    History of present illness:  [name] is a 64 y.o. female with a history of end-stage multiple sclerosis, previous stroke with chronic left-sided hemiparesis, chronic expressive aphasia and dysphagia secondary to severe multiple sclerosis seizure disorder, who  presented from [facility] skilled nursing facility with a report of altered mental status. She is s/p PEG tube insertion at [facility] 2 days prior to admission due to progressive dysphagia. She was also started on Rocephin at the SNF for pneumonia and decubitus ulcer. In the ED, she was febrile with T of 103.1, hypotensive with a SBP ranging from 87-107, tachycardic with heart rate in the 120s to 130s, and with a respiratory rate ranging from 13 to 45. Her Her chest x-ray revealed bibasal infiltrates right greater than left consistent with pneumonia and a small right pleural effusion. Her lab data revealed serum sodium of 167, BUN of 47, creatinine of 1.90, glucose of 197, lactic acid of 3.58, and platelet count of 40. Her UA revealed large hemoglobin and RBCs, but no leukocytes or WBCs (cath specimen). She was admitted for sepsis secondary to HCAP.   Hospital Course:  1. Sepsis secondary to healthcare acquired pneumonia /ventilator dependent respiratory failure. Blood cultures were ordered in the ED. She was started on Zosyn and vancomycin. Vigorous IV fluids were started with half-normal saline due to her dehydration, AKI, and severe hypernatremia. All of her chronic medications were withheld with exception of Keppra and Depakote which were given IV.   Several hours after admission, the patient became more hypoxic and tachypneic. She was placed on BiPAP. ABG on BiPAP revealed pH of 7.36, PCO2 of 46, and PO2 of 121. After several hours on BiPAP, her follow-up ABG worsened. Her lactic acid acid increased Her valproic acid level was therapeutic at 60. Eventually, the ICU E-link physician recommended intubation due to worsening ABG. The family was updated and agreed with intubation/mechanical ventilation. Dr. Conley Rolls performed intubation successfully. IV hydrocortisone was given for sepsis. IV Lasix was also given empirically. Her blood pressure dropped and she was given a bolus  of normal saline. She  was then started on pressor support with Levophed. She developed paroxysmal atrial fibrillation with RVR. IV diltiazem was given which improved her heart rate. Several hours later, the patient apparently developed some bleeding from the PEG tube. An NG tube was placed to low intermittent suction with bloody drainage noted. Patient was started on IV Protonix. her family was updated by Dr. Conley Rolls who explained to them her poor prognosis and possible eminent death. Therefore, the patient's family agreed with discontinuation of intubation/mechanical ventilation, stopping all drips and medications and to make her comfortable. She was made a DNR and progressed to comfort care measures. Morphine drip was ordered. -Following extubation, the patient stabilized and was breathing without assistance and was oxygenating in the 90s on nasal cannula oxygen. Her blood pressure stabilized in the 100s systolically. -The family rescinded the comfort care measures and wanted the patient to be started on reasonable treatment for conditions that were treatable, but she remains a DNR. -Follow-up chest x-ray on 03/06/15 revealed increase in right pleural effusion comment but no overt infiltrate identified. Blood cultures are negative to date. Antibiotics were not restarted. 10 mg of Lasix IV given 1 empirically.   Hypernatremia. The patient's serum sodium was 160 on admission. She was started on half-normal saline. Her serum sodium improved to 146 prior to becoming comfort care. Fluids were discontinued. Comfort care measures were rescinded. Labs were reordered. Her serum sodium increased to 157. -IV fluids with D5 with potassium chloride was started for treatment. Also ordered was free water every 6 hours via the PEG tube. -Her serum sodium improved progressively and was 146 on yesterday. The lab was unable to obtain an adequate venipuncture to assess her serum sodium today, but it was presumed to be within normal  limits. -Recommend continuing free water every 6 hours at the SNF.  Upper GI bleed /acute blood loss anemia. Following intubation, the patient developed bleeding from her PEG tube. NG tube was placed which suctioned bloody drainage. The etiology of her upper GI bleeding was unknown. GI was not consulted in the setting of comfort care measures. The bleeding from the PEG tube resolved. She was restarted on Protonix IV every 12 hours. Patient's follow-up platelet count was significantly lower which could have attributed to the bleeding. She has had a slow decrease in her hemoglobin, which could be attributable to the hemodilution from free water and hypotonic IV fluids. There has been no new active gross bleeding from the PEG tube or per rectum since the day after admission. -Her hemoglobin was 7.7 on yesterday, but she was not transfused and there was no evidence of overt bleeding. -Recommend continuing Protonix twice a day for 2 weeks and then daily thereafter. Recommend checking her H&H in 3-5 days.   Thrombocytopenia. Patient has a history of thrombocytopenia with a baseline platelet count of 94-123 per chart review. It was 40 on admission. The decrease was thought to be secondary to sepsis. However, both Keppra and Depakote can cause thrombocytopenia. Her vitamin B12 level was actually elevated. Her TSH was within normal limits. Her follow-up INR was modestly elevated at 16. Her PTT was within normal limits. Per my curbside consult with neurology, Depakote was the likely etiology, although sepsis was also a possibility for the thrombocytopenia. -Her platelet count fell to 14.  -Both Keppra and valproic acid were discontinued. She was transfused 2 units of platelets on 03/06/2015. Her platelet count increased from 14 to 292. It was 151 on yesterday. The  lab was unable to obtain an adequate venipuncture for a CBC on the day of discharge. -Recommend follow-up CBC in 3-5 days. Of note, Keppra was  restarted following my conversation with neurology.  Chronic dysphagia , status post PEG tube insertion on 03/01/15 at [facility] The patient developed upper GI bleeding of unknown etiology shortly following intubation/mechanical ventilation. She was started on IV Protonix. There was no further upper GI bleeding via the PEG tube. Her family wanted PEG feedings restarted. Water flushes were tried first with no obvious sequelae Therefore, trickle tube feeding with Jevity was started on 03/06/15. -The rate was increased to 30 mL's per hour per the recommendation by the registered dietitian. -Recommend free water every 6 hours. -Of note, GI was not consulted during hospitalization, but if the patient has recurrent upper GI bleeding or bleeding from the PEG tube, would recommend a referral for evaluation. -Recommend continuing twice a day dosing of Protonix for several weeks before changing it to daily..  Long-standing seizure disorder Patient was treated both with Keppra and Depakote chronically. Both were restarted on admission, but were discontinued when she was made comfort care. Keppra was restarted when comfort care was rescinded. However, in light of her profound thrombocytopenia, Keppra was discontinued in favor of Vimpat. -The patient may have had a mild tonic-clonic seizure on 03/07/15. -Discussed with Dr. Gerilyn Pilgrim who stated that thrombocytopenia with Keppra was rare, and likely the insult was from long-standing Depakote. He recommended restarting Keppra at a lower dose-500 mg twice a day and continuing Vimpat at the same dose.  Hypothyroidism. Synthroid was restarted IV at half the usual home dose. Her TSH on admission was within normal limits. -Synthroid was restarted IV, and then transition to per PEG.  Hyperglycemia /prediabetes. The patient's venous glucose was greater than 200 on admission. She was started on sliding scale NovoLog , which was discontinued when she was made  comfort care. Now that comfort care has been rescinded, sliding scale NovoLog was restarted. Her hemoglobin A1c was 6.3.  Hypokalemia. Her serum potassium has trended downward to 3.3 which may have been secondary to hemodilution from free water. Started potassium chloride via the PEG tube. Her potassium level improved. Serum potassium was discontinued at the time of discharge, but would recommend follow-up potassium level in 3-5 days.  Stage III sacral ulcer. Barrier wound care was provided during the hospitalization with offloading and position changes. Recommend that these measures continue daily at the skilled nursing facility.     Procedures:  Intubation/mechanical ventilation 03/04/15. Extubated 03/04/15.  BiPAP  Consultations:  Palliative care  Curbside consult with Dr. Gerilyn Pilgrim, neurologist.  Discharge Exam: Filed Vitals:   03/09/15 0411 03/09/15 1223  BP: 92/56 103/72  Pulse: 103 111  Temp: 98.8 F (37.1 C) 98.5 F (36.9 C)  Resp: 20 20   General: 63 year old African-American woman who opens her eyes to voice, but is chronically aphasic. Oxygen saturation 98%.  Cardiovascular: S1, S2, with a soft systolic murmur.  Respiratory: Coarse breath sounds ; decreased breath sounds in the bases. Breathing nonlabored at rest.  Abdomen: PEG tube in place site without erythema or purulence ; dressing clean and dry. Bowel sounds are present with no tenderness or appreciable distention.  Musculoskeletal/extremities: No acute hot red joints. No pedal edema.  Neurologic/psychiatric: She opens her eyes to voice.    Discharge Instructions   Discharge Instructions    Increase activity slowly    Complete by:  As directed  Current Discharge Medication List    START taking these medications   Details  insulin aspart (NOVOLOG) 100 UNIT/ML injection Sliding-scale NovoLog 2 times a day to 3 times a day: CBG 70-120 no units. CBG 151-200 give 2 units. CBG 201-250  give 3 units. CBG 251-300 give 6 units. CBG 301-350 give 8 units. CBG 351-400 give 10 units. CBG greater than 400 give 12 units and call M.D.    insulin glargine (LANTUS) 100 UNIT/ML injection Inject 0.1 mLs (10 Units total) into the skin at bedtime.    lacosamide 100 MG TABS Place 1 tablet (100 mg total) into feeding tube 2 (two) times daily.    pantoprazole sodium (PROTONIX) 40 mg/20 mL PACK Give via PEG tube twice a day for 2 weeks and then daily thereafter.    Water For Irrigation, Sterile (FREE WATER) SOLN Place 100 mLs into feeding tube every 6 (six) hours.      CONTINUE these medications which have CHANGED   Details  levETIRAcetam (KEPPRA) 500 MG tablet Take 1 tablet (500 mg total) by mouth 2 (two) times daily.    LORazepam (ATIVAN) 2 MG/ML concentrated solution Place 0.5 mLs (1 mg total) into feeding tube every 4 (four) hours as needed for seizure. Sign or symptoms of seizure activity Qty: 30 mL, Refills: 0    Nutritional Supplements (FEEDING SUPPLEMENT, JEVITY 1.2 CAL,) LIQD Place 1,000 mLs into feeding tube daily.    oxyCODONE (OXY IR/ROXICODONE) 5 MG immediate release tablet Place 1 tablet (5 mg total) into feeding tube every 8 (eight) hours. Qty: 30 tablet, Refills: 0      CONTINUE these medications which have NOT CHANGED   Details  acetaminophen (TYLENOL) 650 MG suppository Place 650 mg rectally daily as needed for mild pain.    BIOTIN PO Take 5,000 mcg by mouth daily.    interferon beta-1a (AVONEX) 30 MCG/0.5ML injection Inject 30 mcg into the muscle every 7 (seven) days. On Tuesday    levothyroxine (SYNTHROID, LEVOTHROID) 137 MCG tablet Take 137 mcg by mouth daily before breakfast.    Multiple Vitamins-Minerals (ZINC PO) Take 1 tablet by mouth daily.    senna (SENOKOT) 8.6 MG tablet Take 1 tablet by mouth 2 (two) times daily.     vitamin C (ASCORBIC ACID) 500 MG tablet Take 500 mg by mouth 2 (two) times daily.      STOP taking these medications      cefTRIAXone 1 g in dextrose 5 % 50 mL      divalproex (DEPAKOTE) 500 MG DR tablet      Multiple Vitamins-Minerals (MULTIVITAMIN WITH MINERALS) tablet      ranitidine (ZANTAC) 150 MG tablet        No Known Allergies    The results of significant diagnostics from this hospitalization (including imaging, microbiology, ancillary and laboratory) are listed below for reference.    Significant Diagnostic Studies: Dg Chest 1 View  03/06/2015  CLINICAL DATA:  Pneumonia.  Short of breath. EXAM: CHEST 1 VIEW COMPARISON:  Radiograph 03/04/2015 FINDINGS: The patient is angled in position. Interval extubation. Normal cardiac silhouette. Moderate RIGHT effusion noted. No pneumothorax. No focal airspace disease. Nipple shadow noted on the LEFT. IMPRESSION: 1. Interval extubation. 2. Increase in RIGHT pleural effusion. 3. No overt infiltrate identified. Electronically Signed   By: Genevive Bi M.D.   On: 03/06/2015 08:40   Dg Chest Port 1 View  03/04/2015  CLINICAL DATA:  64 year old female with endotracheal tube placement. EXAM: PORTABLE CHEST 1 VIEW COMPARISON:  Chest radiograph dated 09/30/2010. FINDINGS: Endotracheal tube with tip approximately 1.7 cm above the carina. The tip of the endotracheal tube while above the carina it is tilted towards the right mainstem bronchus recommend retraction and repositioning by approximately 3-4 cm. Single-view of the chest demonstrates bibasilar interstitial prominence, likely atelectatic changes. Pneumonia is less likely. Clinical correlation and follow-up recommended. No focal consolidation, pleural effusion, or pneumothorax. The cardiac silhouette is within normal limits. The osseous structures are grossly unremarkable. Prior IMPRESSION: Endotracheal tube above the carina. Recommend retraction by approximately 3 - 4 cm for better positioning. Electronically Signed   By: Elgie Collard M.D.   On: 03/04/2015 02:01   Dg Chest Portable 1 View  03/03/2015   CLINICAL DATA:  Fever.  Recent PEG tube placement EXAM: PORTABLE CHEST 1 VIEW COMPARISON:  05/21/2014 FINDINGS: Bibasilar infiltrates right greater than left. Small right effusion. Negative for heart failure. Heart size within normal limits. IMPRESSION: Bibasilar infiltrates right greater left consistent with pneumonia. Small right effusion. Electronically Signed   By: Marlan Palau M.D.   On: 03/03/2015 12:00    Microbiology: Recent Results (from the past 240 hour(s))  Urine culture     Status: None   Collection Time: 03/03/15 11:23 AM  Result Value Ref Range Status   Specimen Description URINE, CATHETERIZED  Final   Special Requests NONE  Final   Culture   Final    NO GROWTH 1 DAY Performed at [facility]    Report Status 03/04/2015 FINAL  Final  Culture, blood (routine x 2)     Status: None   Collection Time: 03/03/15 11:37 AM  Result Value Ref Range Status   Specimen Description BLOOD RIGHT HAND DRAWN BY RN  Final   Special Requests BOTTLES DRAWN AEROBIC ONLY 4CC  Final   Culture NO GROWTH 5 DAYS  Final   Report Status 03/08/2015 FINAL  Final  Culture, blood (routine x 2)     Status: None   Collection Time: 03/03/15 11:47 AM  Result Value Ref Range Status   Specimen Description BLOOD LEFT ANTECUBITAL  Final   Special Requests BOTTLES DRAWN AEROBIC ONLY 6CC  Final   Culture NO GROWTH 5 DAYS  Final   Report Status 03/08/2015 FINAL  Final  MRSA PCR Screening     Status: None   Collection Time: 03/03/15  2:46 PM  Result Value Ref Range Status   MRSA by PCR NEGATIVE NEGATIVE Final    Comment:        The GeneXpert MRSA Assay (FDA approved for NASAL specimens only), is one component of a comprehensive MRSA colonization surveillance program. It is not intended to diagnose MRSA infection nor to guide or monitor treatment for MRSA infections.      Labs: Basic Metabolic Panel:  Recent Labs Lab 03/04/15 0102 03/06/15 0628 03/07/15 0618 03/08/15 0544  03/09/15 0947  NA 158* 157* 151* 146* 139  K 4.0 3.4* 3.3* 3.6 4.1  CL 129* 126* 118* 116* 108  CO2 GLUCOSE 147* 144* 179* 149* 156*  BUN 37* 49* 35* 24* 17  CREATININE 1.33* 1.09* 0.93 0.82 0.79  CALCIUM 8.2* 8.9 8.8* 8.2* 8.2*   Liver Function Tests:  Recent Labs Lab 03/03/15 1135 03/04/15 0300 03/06/15 0628  AST 58* 55* 54*  ALT 14 10* 14  ALKPHOS 136* 122 94  BILITOT 0.5 0.7 0.7  PROT 7.2 6.2* 6.0*  ALBUMIN 1.8* 1.6* 1.6*   No results for input(s): LIPASE,  AMYLASE in the last 168 hours. No results for input(s): AMMONIA in the last 168 hours. CBC:  Recent Labs Lab 03/03/15 1135 03/03/15 1202 03/06/15 0628 03/07/15 0618 03/08/15 0544  WBC 11.7*  --  8.1 7.8 8.4  NEUTROABS 5.1  --   --   --   --   HGB 11.2* 12.9 10.9* 8.8* 7.7*  HCT 38.5 38.0 36.2 29.3* 24.9*  MCV 99.5  --  96.5 96.4 95.4  PLT 40*  --  14* 292 151   Cardiac Enzymes: No results for input(s): CKTOTAL, CKMB, CKMBINDEX, TROPONINI in the last 168 hours. BNP: BNP (last 3 results) No results for input(s): BNP in the last 8760 hours.  ProBNP (last 3 results) No results for input(s): PROBNP in the last 8760 hours.  CBG:  Recent Labs Lab 03/08/15 1959 03/09/15 0020 03/09/15 0410 03/09/15 0731 03/09/15 1139  GLUCAP 135* 142* 162* 134* 133*       Signed:  FISHER,DENISE  [facility] 03/09/2015, 12:44 PM

## 2015-03-09 NOTE — Clinical Social Work Note (Signed)
CSW facilitated discharge.  CSW notified patient's sister's [name] and [name] that patient was being discharged and would be transported to [facility] via ambulance.   CSW notified Debbie at [facility] that patient was being discharged.  CSW signing off. Annice Needy, [place] [phone]

## 2015-03-26 ENCOUNTER — Inpatient Hospital Stay (HOSPITAL_COMMUNITY)
Admission: EM | Admit: 2015-03-26 | Discharge: 2015-03-28 | DRG: 640 | Disposition: A | Discharge status: Skilled Nursing Facility | Payer: Medicare Other | Attending: Internal Medicine | Admitting: Internal Medicine

## 2015-03-26 ENCOUNTER — Encounter (HOSPITAL_COMMUNITY): Payer: Self-pay | Admitting: *Deleted

## 2015-03-26 DIAGNOSIS — K219 Gastro-esophageal reflux disease without esophagitis: Secondary | ICD-10-CM | POA: Diagnosis present

## 2015-03-26 DIAGNOSIS — G894 Chronic pain syndrome: Secondary | ICD-10-CM | POA: Diagnosis present

## 2015-03-26 DIAGNOSIS — L89154 Pressure ulcer of sacral region, stage 4: Secondary | ICD-10-CM | POA: Diagnosis present

## 2015-03-26 DIAGNOSIS — I10 Essential (primary) hypertension: Secondary | ICD-10-CM | POA: Diagnosis present

## 2015-03-26 DIAGNOSIS — G35 Multiple sclerosis: Secondary | ICD-10-CM | POA: Diagnosis present

## 2015-03-26 DIAGNOSIS — E039 Hypothyroidism, unspecified: Secondary | ICD-10-CM | POA: Diagnosis present

## 2015-03-26 DIAGNOSIS — Z66 Do not resuscitate: Secondary | ICD-10-CM | POA: Diagnosis present

## 2015-03-26 DIAGNOSIS — Z931 Gastrostomy status: Secondary | ICD-10-CM | POA: Diagnosis not present

## 2015-03-26 DIAGNOSIS — G40909 Epilepsy, unspecified, not intractable, without status epilepticus: Secondary | ICD-10-CM | POA: Diagnosis present

## 2015-03-26 DIAGNOSIS — D62 Acute posthemorrhagic anemia: Secondary | ICD-10-CM | POA: Diagnosis present

## 2015-03-26 DIAGNOSIS — E871 Hypo-osmolality and hyponatremia: Secondary | ICD-10-CM | POA: Diagnosis present

## 2015-03-26 DIAGNOSIS — G8194 Hemiplegia, unspecified affecting left nondominant side: Secondary | ICD-10-CM | POA: Diagnosis present

## 2015-03-26 DIAGNOSIS — E860 Dehydration: Secondary | ICD-10-CM | POA: Diagnosis present

## 2015-03-26 DIAGNOSIS — D638 Anemia in other chronic diseases classified elsewhere: Secondary | ICD-10-CM | POA: Diagnosis present

## 2015-03-26 DIAGNOSIS — N179 Acute kidney failure, unspecified: Secondary | ICD-10-CM | POA: Diagnosis present

## 2015-03-26 DIAGNOSIS — R739 Hyperglycemia, unspecified: Secondary | ICD-10-CM

## 2015-03-26 DIAGNOSIS — E1165 Type 2 diabetes mellitus with hyperglycemia: Secondary | ICD-10-CM | POA: Diagnosis present

## 2015-03-26 DIAGNOSIS — Z794 Long term (current) use of insulin: Secondary | ICD-10-CM

## 2015-03-26 DIAGNOSIS — E875 Hyperkalemia: Secondary | ICD-10-CM | POA: Diagnosis present

## 2015-03-26 DIAGNOSIS — Z820 Family history of epilepsy and other diseases of the nervous system: Secondary | ICD-10-CM | POA: Diagnosis not present

## 2015-03-26 DIAGNOSIS — D649 Anemia, unspecified: Secondary | ICD-10-CM

## 2015-03-26 DIAGNOSIS — L89153 Pressure ulcer of sacral region, stage 3: Secondary | ICD-10-CM | POA: Diagnosis present

## 2015-03-26 DIAGNOSIS — R000 Tachycardia, unspecified: Secondary | ICD-10-CM

## 2015-03-26 HISTORY — DX: Acidosis: E87.2

## 2015-03-26 HISTORY — DX: Acidosis, unspecified: E87.20

## 2015-03-26 LAB — URINALYSIS, ROUTINE W REFLEX MICROSCOPIC
BILIRUBIN URINE: NEGATIVE
GLUCOSE, UA: NEGATIVE mg/dL
KETONES UR: NEGATIVE mg/dL
Nitrite: NEGATIVE
PROTEIN: 30 mg/dL — AB
Specific Gravity, Urine: 1.015 (ref 1.005–1.030)
pH: 5.5 (ref 5.0–8.0)

## 2015-03-26 LAB — CBG MONITORING, ED: GLUCOSE-CAPILLARY: 135 mg/dL — AB (ref 65–99)

## 2015-03-26 LAB — GLUCOSE, CAPILLARY
GLUCOSE-CAPILLARY: 58 mg/dL — AB (ref 65–99)
Glucose-Capillary: 96 mg/dL (ref 65–99)

## 2015-03-26 LAB — CBC WITH DIFFERENTIAL/PLATELET
BASOS ABS: 0 10*3/uL (ref 0.0–0.1)
BASOS PCT: 1 %
Eosinophils Absolute: 0 10*3/uL (ref 0.0–0.7)
Eosinophils Relative: 0 %
HEMATOCRIT: 24.6 % — AB (ref 36.0–46.0)
HEMOGLOBIN: 7.6 g/dL — AB (ref 12.0–15.0)
LYMPHS PCT: 24 %
Lymphs Abs: 1.9 10*3/uL (ref 0.7–4.0)
MCH: 30.3 pg (ref 26.0–34.0)
MCHC: 30.9 g/dL (ref 30.0–36.0)
MCV: 98 fL (ref 78.0–100.0)
MONOS PCT: 22 %
Monocytes Absolute: 1.7 10*3/uL — ABNORMAL HIGH (ref 0.1–1.0)
NEUTROS ABS: 4.3 10*3/uL (ref 1.7–7.7)
NEUTROS PCT: 53 %
Platelets: 235 10*3/uL (ref 150–400)
RBC: 2.51 MIL/uL — ABNORMAL LOW (ref 3.87–5.11)
RDW: 24.6 % — ABNORMAL HIGH (ref 11.5–15.5)
WBC: 8 10*3/uL (ref 4.0–10.5)

## 2015-03-26 LAB — URINE MICROSCOPIC-ADD ON

## 2015-03-26 LAB — ABO/RH: ABO/RH(D): O POS

## 2015-03-26 LAB — COMPREHENSIVE METABOLIC PANEL
ALBUMIN: 1.9 g/dL — AB (ref 3.5–5.0)
ALK PHOS: 170 U/L — AB (ref 38–126)
ALT: 27 U/L (ref 14–54)
AST: 64 U/L — AB (ref 15–41)
Anion gap: 14 (ref 5–15)
BILIRUBIN TOTAL: 1.1 mg/dL (ref 0.3–1.2)
BUN: 61 mg/dL — AB (ref 6–20)
CALCIUM: 9.6 mg/dL (ref 8.9–10.3)
CO2: 23 mmol/L (ref 22–32)
CREATININE: 2.51 mg/dL — AB (ref 0.44–1.00)
Chloride: 91 mmol/L — ABNORMAL LOW (ref 101–111)
GFR calc Af Amer: 22 mL/min — ABNORMAL LOW (ref >60)
GFR calc non Af Amer: 19 mL/min — ABNORMAL LOW (ref >60)
GLUCOSE: 97 mg/dL (ref 65–99)
Potassium: 6.5 mmol/L (ref 3.5–5.1)
Sodium: 128 mmol/L — ABNORMAL LOW (ref 135–145)
TOTAL PROTEIN: 7.8 g/dL (ref 6.5–8.1)

## 2015-03-26 LAB — POC OCCULT BLOOD, ED: FECAL OCCULT BLD: NEGATIVE

## 2015-03-26 LAB — CREATININE, URINE, RANDOM: CREATININE, URINE: 27.82 mg/dL

## 2015-03-26 LAB — SODIUM, URINE, RANDOM: Sodium, Ur: 12 mmol/L

## 2015-03-26 MED ORDER — LORAZEPAM 0.5 MG PO TABS: 1.0000 mg | ORAL_TABLET | ORAL | Status: DC | PRN

## 2015-03-26 MED ORDER — INSULIN ASPART 100 UNIT/ML ~~LOC~~ SOLN: 0.0000 [IU] | Freq: Every day | SUBCUTANEOUS | Status: DC

## 2015-03-26 MED ORDER — LEVOTHYROXINE SODIUM 25 MCG PO TABS
137.0000 ug | ORAL_TABLET | Freq: Every day | ORAL | Status: DC
  Administered 2015-03-27 – 2015-03-28 (×2): 137 ug via ORAL
  Filled 2015-03-26 (×2): qty 1

## 2015-03-26 MED ORDER — PRO-STAT SUGAR FREE PO LIQD
30.0000 mL | Freq: Two times a day (BID) | ORAL | Status: DC
  Administered 2015-03-26 – 2015-03-28 (×4): 30 mL
  Filled 2015-03-26 (×3): qty 30

## 2015-03-26 MED ORDER — ONDANSETRON HCL 4 MG/2ML IJ SOLN: 4.0000 mg | Freq: Four times a day (QID) | INTRAMUSCULAR | Status: DC | PRN

## 2015-03-26 MED ORDER — OXYCODONE HCL 5 MG PO TABS
5.0000 mg | ORAL_TABLET | Freq: Three times a day (TID) | ORAL | Status: DC
  Administered 2015-03-26 – 2015-03-28 (×4): 5 mg
  Filled 2015-03-26 (×4): qty 1

## 2015-03-26 MED ORDER — INSULIN ASPART 100 UNIT/ML ~~LOC~~ SOLN
0.0000 [IU] | Freq: Three times a day (TID) | SUBCUTANEOUS | Status: DC
Start: 2015-03-27 — End: 2015-03-28
  Administered 2015-03-27 (×2): 2 [IU] via SUBCUTANEOUS
  Administered 2015-03-27: 3 [IU] via SUBCUTANEOUS

## 2015-03-26 MED ORDER — JEVITY 1.5 CAL/FIBER PO LIQD
1000.0000 mL | ORAL | Status: DC
  Administered 2015-03-26 – 2015-03-28 (×2): 1000 mL
  Filled 2015-03-26 (×3): qty 1000

## 2015-03-26 MED ORDER — DEXTROSE 50 % IV SOLN
INTRAVENOUS | Status: AC
  Administered 2015-03-26: 50 mL
  Filled 2015-03-26: qty 50

## 2015-03-26 MED ORDER — INSULIN ASPART 100 UNIT/ML ~~LOC~~ SOLN
SUBCUTANEOUS | Status: AC
  Administered 2015-03-26: 19:00:00
  Filled 2015-03-26: qty 1

## 2015-03-26 MED ORDER — ACETAMINOPHEN ER 650 MG PO TBCR: 650.0000 mg | EXTENDED_RELEASE_TABLET | Freq: Three times a day (TID) | ORAL | Status: DC | PRN

## 2015-03-26 MED ORDER — SODIUM CHLORIDE 0.9 % IV SOLN
INTRAVENOUS | Status: DC
  Administered 2015-03-26 – 2015-03-28 (×3): via INTRAVENOUS

## 2015-03-26 MED ORDER — ENOXAPARIN SODIUM 30 MG/0.3ML ~~LOC~~ SOLN
30.0000 mg | SUBCUTANEOUS | Status: DC
  Administered 2015-03-26: 30 mg via SUBCUTANEOUS
  Filled 2015-03-26: qty 0.3

## 2015-03-26 MED ORDER — SODIUM CHLORIDE 0.9 % IV SOLN: 250.0000 mL | INTRAVENOUS | Status: DC | PRN

## 2015-03-26 MED ORDER — SODIUM CHLORIDE 0.9 % IV SOLN
1.0000 g | Freq: Once | INTRAVENOUS | Status: AC
  Administered 2015-03-26: 1 g via INTRAVENOUS
  Filled 2015-03-26: qty 10

## 2015-03-26 MED ORDER — INSULIN GLARGINE 100 UNIT/ML ~~LOC~~ SOLN
10.0000 [IU] | Freq: Every day | SUBCUTANEOUS | Status: DC
  Administered 2015-03-27: 10 [IU] via SUBCUTANEOUS
  Filled 2015-03-26 (×3): qty 0.1

## 2015-03-26 MED ORDER — SODIUM CHLORIDE 0.9 % IV BOLUS (SEPSIS)
1000.0000 mL | Freq: Once | INTRAVENOUS | Status: AC
  Administered 2015-03-26: 1000 mL via INTRAVENOUS

## 2015-03-26 MED ORDER — DEXTROSE 5 % IV SOLN
INTRAVENOUS | Status: AC
  Filled 2015-03-26: qty 10

## 2015-03-26 MED ORDER — LORAZEPAM 2 MG/ML PO CONC: 1.0000 mg | ORAL | Status: DC | PRN

## 2015-03-26 MED ORDER — LEVETIRACETAM 500 MG PO TABS
500.0000 mg | ORAL_TABLET | Freq: Two times a day (BID) | ORAL | Status: DC
  Administered 2015-03-27: 500 mg via ORAL
  Filled 2015-03-26: qty 1

## 2015-03-26 MED ORDER — SODIUM CHLORIDE 0.9 % IV SOLN: Freq: Once | INTRAVENOUS | Status: DC

## 2015-03-26 MED ORDER — INSULIN ASPART 100 UNIT/ML IV SOLN
10.0000 [IU] | Freq: Once | INTRAVENOUS | Status: AC
  Administered 2015-03-26: 10 [IU] via INTRAVENOUS

## 2015-03-26 MED ORDER — INTERFERON BETA-1A 30 MCG/0.5ML IM PSKT: 30.0000 ug | PREFILLED_SYRINGE | INTRAMUSCULAR | Status: DC

## 2015-03-26 MED ORDER — DEXTROSE 5 % IV SOLN
1.0000 g | INTRAVENOUS | Status: DC
  Administered 2015-03-26 – 2015-03-28 (×2): 1 g via INTRAVENOUS
  Filled 2015-03-26 (×3): qty 10

## 2015-03-26 MED ORDER — DEXTROSE 50 % IV SOLN
INTRAVENOUS | Status: AC
  Administered 2015-03-26: 20:00:00
  Filled 2015-03-26: qty 50

## 2015-03-26 MED ORDER — ACETAMINOPHEN 325 MG PO TABS
650.0000 mg | ORAL_TABLET | Freq: Four times a day (QID) | ORAL | Status: DC | PRN
  Administered 2015-03-27: 650 mg via ORAL
  Filled 2015-03-26: qty 2

## 2015-03-26 MED ORDER — SODIUM POLYSTYRENE SULFONATE 15 GM/60ML PO SUSP
15.0000 g | Freq: Once | ORAL | Status: AC
  Administered 2015-03-26: 15 g via ORAL
  Filled 2015-03-26: qty 60

## 2015-03-26 MED ORDER — LACOSAMIDE 50 MG PO TABS
100.0000 mg | ORAL_TABLET | Freq: Two times a day (BID) | ORAL | Status: DC
  Administered 2015-03-26 – 2015-03-28 (×4): 100 mg
  Filled 2015-03-26 (×3): qty 2

## 2015-03-26 MED ORDER — DEXTROSE 50 % IV SOLN
1.0000 | Freq: Once | INTRAVENOUS | Status: AC
  Administered 2015-03-26: 50 mL via INTRAVENOUS

## 2015-03-26 MED ORDER — PANTOPRAZOLE SODIUM 40 MG IV SOLR
40.0000 mg | Freq: Every day | INTRAVENOUS | Status: DC
  Administered 2015-03-26 – 2015-03-28 (×2): 40 mg via INTRAVENOUS
  Filled 2015-03-26 (×2): qty 40

## 2015-03-26 NOTE — ED Notes (Signed)
O2 sat fluctuating from 85-92. O2 started @ 2 L/min via for comfort

## 2015-03-26 NOTE — H&P (Signed)
History and Physical  [name]:[id number] DOB: December 28, 1950 DOA: 03/26/2015  Referring physician: Dr Clayborne Dana, ED physician PCP: Colon Branch, MD   Chief Complaint: Elevated lab  HPI: [name] is a 64 y.o. female  with a history of end-stage multiple sclerosis, previous stroke with chronic left-sided hemiparesis, chronic expressive aphasia and dysphagia secondary to severe multiple sclerosis seizure disorder, chronic pain syndrome, and hypothyroidism, with a recent hospitalization for sepsis secondary to stage IV sacral ulcer and acute blood loss anemia. She presents from [facility] skilled nursing [facility] with a report of abnormal lab. The patient had routine blood work, which showed a potassium of 5.9. The patient was brought to the emergency department for evaluation. Here the patient's potassium is 6.5 and slightly hemolyzed. Patient is aphasic and is unable to provide any history. From the nursing home, there is no accounts of melena, rectal bleeding, or any other bleeding.  Review of Systems:  Unable to assess secondary to patient's aphasia  Past Medical History  Diagnosis Date  . Multiple sclerosis (HCC)   . Seizures (HCC)   . Dysphagia   . Dehydration   . Hypertension   . Hemiparesis, left (HCC)   . Pneumonia   . Thrombocytopenia (HCC)   . Aphasia   . Depression   . Hypothyroid   . Esophageal reflux   . Stroke (HCC)   . Lack of coordination   . Contracture of ankle and foot joint   . Obesity   . Muscle weakness   . Dysphagia 12/01/2011  . Chronic pain syndrome 12/01/2011  . GERD (gastroesophageal reflux disease)   . Status post insertion of percutaneous endoscopic gastrostomy (PEG) tube (HCC) 03/01/2015    at [facility]  . 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 Acidosis   . Aphasia    History reviewed. No pertinent past surgical history. Social History:  reports that she has never smoked. She does not have any smokeless tobacco  history on file. She reports that she does not drink alcohol or use illicit drugs. Patient lives at [name] & is unable to participate in activities of daily living  No Known Allergies  Patient unable to provide family history, but per old records there is a family history of multiple sclerosis  Prior to Admission medications   Medication Sig Start Date End Date Taking? Authorizing Provider  acetaminophen (TYLENOL) 650 MG CR tablet 650 mg by Enteral route daily as needed for pain.   Yes Historical Provider, MD  Amino Acids-Protein Hydrolys (FEEDING SUPPLEMENT, PRO-STAT SUGAR FREE 64,) LIQD 30 mLs by Enteral route 2 (two) times daily.   Yes Historical Provider, MD  BIOTIN PO 5,000 mcg by Enteral route daily.    Yes Historical Provider, MD  insulin aspart (NOVOLOG) 100 UNIT/ML injection Sliding-scale NovoLog 2 times a day to 3 times a day: CBG 70-120 no units. CBG 151-200 give 2 units. CBG 201-250 give 3 units. CBG 251-300 give 6 units. CBG 301-350 give 8 units. CBG 351-400 give 10 units. CBG greater than 400 give 12 units and call M.D. Patient taking differently: Inject 2-4 Units into the skin 3 (three) times daily with meals. Sliding-scale NovoLog 2 times a day to 3 times a day: CBG 70-120 no units. CBG 151-200 give 2 units. CBG 201-250 give 3 units. CBG 251-300 give 6 units. CBG 301-350 give 8 units. CBG 351-400 give 10 units. CBG greater than 400 give 12 units and call M.D.  03/09/15  Yes Elliot Cousin, MD  insulin glargine (LANTUS) 100 UNIT/ML injection Inject 0.1 mLs (10 Units total) into the skin at bedtime. 03/09/15  Yes Elliot Cousin, MD  interferon beta-1a (AVONEX) 30 MCG/0.5ML injection Inject 30 mcg into the muscle every 7 (seven) days. On Tuesday   Yes Historical Provider, MD  lacosamide 100 MG TABS Place 1 tablet (100 mg total) into feeding tube 2 (two) times daily. Patient taking differently: 100 mg by Enteral route 2 (two) times daily.  03/09/15  Yes Elliot Cousin, MD  levETIRAcetam (KEPPRA) 500 MG tablet Take 1 tablet (500 mg total) by mouth 2 (two) times daily. Patient taking differently: 500 mg by Enteral route 2 (two) times daily.  03/09/15  Yes Elliot Cousin, MD  levothyroxine (SYNTHROID, LEVOTHROID) 137 MCG tablet 137 mcg by Enteral route daily before breakfast.    Yes Historical Provider, MD  LORazepam (ATIVAN) 2 MG/ML concentrated solution Place 0.5 mLs (1 mg total) into feeding tube every 4 (four) hours as needed for seizure. Sign or symptoms of seizure activity 03/09/15  Yes Elliot Cousin, MD  oxyCODONE (OXY IR/ROXICODONE) 5 MG immediate release tablet Place 1 tablet (5 mg total) into feeding tube every 8 (eight) hours. 03/09/15  Yes Elliot Cousin, MD  pantoprazole (PROTONIX) 40 MG tablet 40 mg by Enteral route daily.   Yes Historical Provider, MD  senna (SENOKOT) 8.6 MG tablet 1 tablet by Enteral route 2 (two) times daily.    Yes Historical Provider, MD  vitamin C (ASCORBIC ACID) 500 MG tablet 500 mg by Enteral route 2 (two) times daily.    Yes Historical Provider, MD  zinc sulfate 220 MG capsule 220 mg by Enteral route daily.   Yes Historical Provider, MD    Physical Exam: BP 94/33 mmHg  Pulse 123  Temp(Src) 99.3 F (37.4 C) (Rectal)  Resp 34  SpO2 93%  General: Older black female. Awake and alert. No acute cardiopulmonary distress.  Eyes: Pupils equal, round, reactive to light. Extraocular muscles are intact. Sclerae anicteric and noninjected.  ENT:  Dry mucosal membranes. No mucosal lesions. Neck: Neck supple without lymphadenopathy. No carotid bruits. No masses palpated.  Cardiovascular: Regular rate with normal S1-S2 sounds. No murmurs, rubs, gallops auscultated. No JVD.  Respiratory: Good respiratory effort with no wheezes, rales, rhonchi. Lungs clear to auscultation bilaterally.  Abdomen: Soft, nontender, nondistended. Active bowel sounds. No masses or hepatosplenomegaly  Skin: Dry, warm to touch. 2+ dorsalis pedis and  radial pulses. There is a large 5-6 cm stage III pressure ulcer on the sacral area. Musculoskeletal: No calf or leg pain. All major joints not erythematous nontender.  Psychiatric: Unable to assess.  Neurologic: Patient unable to move extremities.           Labs on Admission:  Basic Metabolic Panel:  Recent Labs Lab 03/26/15 1648  NA 128*  K 6.5*  CL 91*  CO2 23  GLUCOSE 97  BUN 61*  CREATININE 2.51*  CALCIUM 9.6   Liver Function Tests:  Recent Labs Lab 03/26/15 1648  AST 64*  ALT 27  ALKPHOS 170*  BILITOT 1.1  PROT 7.8  ALBUMIN 1.9*   No results for input(s): LIPASE, AMYLASE in the last 168 hours. No results for input(s): AMMONIA in the last 168 hours. CBC:  Recent Labs Lab 03/26/15 1648  WBC 8.0  NEUTROABS 4.3  HGB 7.6*  HCT 24.6*  MCV 98.0  PLT 235   Cardiac Enzymes: No results for input(s): CKTOTAL, CKMB, CKMBINDEX, TROPONINI in  the last 168 hours.  BNP (last 3 results) No results for input(s): BNP in the last 8760 hours.  ProBNP (last 3 results) No results for input(s): PROBNP in the last 8760 hours.  CBG: No results for input(s): GLUCAP in the last 168 hours.  Radiological Exams on Admission: No results found.  EKG: Independently reviewed. Sinus tachycardia. No peak T waves. No ST changes. There is a lot of artifact.  Assessment/Plan Present on Admission:  . Hyperkalemia . AKI (acute kidney injury) (HCC) . Dehydration with hyponatremia . Acute blood loss anemia . Stage III pressure ulcer of sacral region [facility])  This patient was discussed with the ED physician, including pertinent vitals, physical exam findings, labs, and imaging.  We also discussed care given by the ED provider.  #1 hyperkalemia  Admit to stepdown  Urine studies  Repeat potassium now  Patient has already received calcium gluconate's, insulin, glucose. Will give Kayexalate.  Repeat potassium in the morning #2 dehydration with hypernatremia  Repeat sodium in  the morning  Will likely resolve with hydration #3 acute kidney failure  Secondary to dehydration  Full fluid replace - 1 L bolus now then 125 mL per hour after  Recheck creatinine in the morning #4 stage III pressure ulcer of sacral region  Wound care consult #5 acute blood loss anemia  Hemoccult negative in the ER. Will continue to send Hemoccult cards 3 #6 diabetes  Continue Lantus 10 units at bedtime and sliding scale insulin #7 multiple sclerosis-end-stage  Rotate patient every 2 hours  DVT prophylaxis: Lovenox  Consultants: None  Code Status: DO NOT RESUSCITATE  Family Communication: Family members in the room   Disposition Plan: Admit to stepdown   Levie Heritage, DO [facility] Pager [phone]

## 2015-03-26 NOTE — ED Notes (Signed)
Resident alert with eyes open . Non verbal. Total care pt. Makes no voluntary movements. Incontinent of bowel and bladder. #24 g tube to upper abdomen.

## 2015-03-26 NOTE — ED Notes (Signed)
Pt without urine output # 16 foley inserted with 1800 ml of urine obtained. Urine dark amber in color. Noted to have pressure ulcer to sacrum and right ischium. Foul smelling w necrotic tissue  Dry dressing applied

## 2015-03-26 NOTE — ED Provider Notes (Addendum)
CSN: [id number]     Arrival date & time 03/26/15  1621 History   First MD Initiated Contact with Patient 03/26/15 1625     Chief Complaint  Patient presents with  . Abnormal Lab     (Consider location/radiation/quality/duration/timing/severity/associated sxs/prior Treatment) Patient is a 64 y.o. female presenting with general illness.  Illness Location:  Body Quality:  Decreased hemoglobin, elevated potassium Severity:  Mild Onset quality:  Unable to specify Timing:  Unable to specify Chronicity:  New Context:  At SNF Relieved by:  None tried Worsened by:  None tried   Past Medical History  Diagnosis Date  . Multiple sclerosis (HCC)   . Seizures (HCC)   . Dysphagia   . Dehydration   . Hypertension   . Hemiparesis, left (HCC)   . Pneumonia   . Thrombocytopenia (HCC)   . Aphasia   . Depression   . Hypothyroid   . Esophageal reflux   . Stroke (HCC)   . Lack of coordination   . Contracture of ankle and foot joint   . Obesity   . Muscle weakness   . Dysphagia 12/01/2011  . Chronic pain syndrome 12/01/2011  . GERD (gastroesophageal reflux disease)   . Status post insertion of percutaneous endoscopic gastrostomy (PEG) tube (HCC) 03/01/2015    at [facility]  . 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 Acidosis   . Aphasia    History reviewed. No pertinent past surgical history. No family history on file. Social History  Substance Use Topics  . Smoking status: Never Smoker   . Smokeless tobacco: None  . Alcohol Use: No   OB History    No data available     Review of Systems  Unable to perform ROS: Patient nonverbal      Allergies  Review of patient's allergies indicates no known allergies.  Home Medications   Prior to Admission medications   Medication Sig Start Date End Date Taking? Authorizing Provider  acetaminophen (TYLENOL) 650 MG suppository Place 650 mg rectally daily as needed for mild pain.    Historical Provider, MD   BIOTIN PO Take 5,000 mcg by mouth daily.    Historical Provider, MD  insulin aspart (NOVOLOG) 100 UNIT/ML injection Sliding-scale NovoLog 2 times a day to 3 times a day: CBG 70-120 no units. CBG 151-200 give 2 units. CBG 201-250 give 3 units. CBG 251-300 give 6 units. CBG 301-350 give 8 units. CBG 351-400 give 10 units. CBG greater than 400 give 12 units and call M.D. 03/09/15   Elliot Cousin, MD  insulin glargine (LANTUS) 100 UNIT/ML injection Inject 0.1 mLs (10 Units total) into the skin at bedtime. 03/09/15   Elliot Cousin, MD  interferon beta-1a (AVONEX) 30 MCG/0.5ML injection Inject 30 mcg into the muscle every 7 (seven) days. On Tuesday    Historical Provider, MD  lacosamide 100 MG TABS Place 1 tablet (100 mg total) into feeding tube 2 (two) times daily. 03/09/15   Elliot Cousin, MD  levETIRAcetam (KEPPRA) 500 MG tablet Take 1 tablet (500 mg total) by mouth 2 (two) times daily. 03/09/15   Elliot Cousin, MD  levothyroxine (SYNTHROID, LEVOTHROID) 137 MCG tablet Take 137 mcg by mouth daily before breakfast.    Historical Provider, MD  LORazepam (ATIVAN) 2 MG/ML concentrated solution Place 0.5 mLs (1 mg total) into feeding tube every 4 (four) hours as needed for seizure. Sign or symptoms of seizure activity 03/09/15   Elliot Cousin, MD  Multiple Vitamins-Minerals (ZINC  PO) Take 1 tablet by mouth daily.    Historical Provider, MD  Nutritional Supplements (FEEDING SUPPLEMENT, JEVITY 1.2 CAL,) LIQD Place 1,000 mLs into feeding tube daily. 03/09/15   Elliot Cousin, MD  oxyCODONE (OXY IR/ROXICODONE) 5 MG immediate release tablet Place 1 tablet (5 mg total) into feeding tube every 8 (eight) hours. 03/09/15   Elliot Cousin, MD  pantoprazole sodium (PROTONIX) 40 mg/20 mL PACK Give via PEG tube twice a day for 2 weeks and then daily thereafter. 03/09/15   Elliot Cousin, MD  senna (SENOKOT) 8.6 MG tablet Take 1 tablet by mouth 2 (two) times daily.     Historical Provider, MD  vitamin C (ASCORBIC ACID) 500 MG  tablet Take 500 mg by mouth 2 (two) times daily.    Historical Provider, MD  Water For Irrigation, Sterile (FREE WATER) SOLN Place 100 mLs into feeding tube every 6 (six) hours. 03/09/15   Elliot Cousin, MD   BP 96/79 mmHg  Pulse 104  Resp 18  SpO2 95% Physical Exam  Constitutional: She appears well-developed and well-nourished.  HENT:  Head: Normocephalic and atraumatic.  Eyes: Conjunctivae are normal. Pupils are equal, round, and reactive to light.  Neck: Normal range of motion.  Cardiovascular: Regular rhythm.  Tachycardia present.   Pulmonary/Chest: No stridor. Tachypnea noted. No respiratory distress. She has wheezes. She has no rales.  Abdominal: She exhibits no distension.  Musculoskeletal: She exhibits no edema or tenderness.  Neurological: She is alert.  Skin: Skin is warm and dry.  Nursing note and vitals reviewed.   ED Course  Procedures (including critical care time)  CRITICAL CARE Performed by: Marily Memos   Total critical care time: 45 minutes Critical care time was exclusive of separately billable procedures and treating other patients. Critical care was necessary to treat or prevent imminent or life-threatening deterioration. Critical care was time spent personally by me on the following activities: development of treatment plan with patient and/or surrogate as well as nursing, discussions with consultants, evaluation of patient's response to treatment, examination of patient, obtaining history from patient or surrogate, ordering and performing treatments and interventions, ordering and review of laboratory studies, ordering and review of radiographic studies, pulse oximetry and re-evaluation of patient's condition.  Angiocath insertion Performed by: Marily Memos  Consent: Verbal consent obtained. Risks and benefits: risks, benefits and alternatives were discussed Time out: Immediately prior to procedure a "time out" was called to verify the correct patient,  procedure, equipment, support staff and site/side marked as required.  Preparation: Patient was prepped and draped in the usual sterile fashion.  Vein Location: left AC  Ultrasound Guided  Gauge: 20  Normal blood return and flush without difficulty Patient tolerance: Patient tolerated the procedure well with no immediate complications.     Labs Review Labs Reviewed  MRSA PCR SCREENING - Abnormal; Notable for the following:    MRSA by PCR POSITIVE (*)    All other components within normal limits  CBC WITH DIFFERENTIAL/PLATELET - Abnormal; Notable for the following:    RBC 2.51 (*)    Hemoglobin 7.6 (*)    HCT 24.6 (*)    RDW 24.6 (*)    Monocytes Absolute 1.7 (*)    All other components within normal limits  COMPREHENSIVE METABOLIC PANEL - Abnormal; Notable for the following:    Sodium 128 (*)    Potassium 6.5 (*)    Chloride 91 (*)    BUN 61 (*)    Creatinine, Ser 2.51 (*)  Albumin 1.9 (*)    AST 64 (*)    Alkaline Phosphatase 170 (*)    GFR calc non Af Amer 19 (*)    GFR calc Af Amer 22 (*)    All other components within normal limits  URINALYSIS, ROUTINE W REFLEX MICROSCOPIC (NOT AT [facility]) - Abnormal; Notable for the following:    APPearance TURBID (*)    Hgb urine dipstick MODERATE (*)    Protein, ur 30 (*)    Leukocytes, UA LARGE (*)    All other components within normal limits  URINE MICROSCOPIC-ADD ON - Abnormal; Notable for the following:    Squamous Epithelial / LPF 6-30 (*)    Bacteria, UA MANY (*)    All other components within normal limits  BASIC METABOLIC PANEL - Abnormal; Notable for the following:    Sodium 130 (*)    Chloride 98 (*)    BUN 55 (*)    Creatinine, Ser 2.33 (*)    GFR calc non Af Amer 21 (*)    GFR calc Af Amer 24 (*)    All other components within normal limits  CBC - Abnormal; Notable for the following:    RBC 1.95 (*)    Hemoglobin 5.9 (*)    HCT 19.5 (*)    RDW 25.1 (*)    All other components within normal limits   BASIC METABOLIC PANEL - Abnormal; Notable for the following:    Sodium 131 (*)    Chloride 100 (*)    CO2 21 (*)    Glucose, Bld 143 (*)    BUN 54 (*)    Creatinine, Ser 1.95 (*)    GFR calc non Af Amer 26 (*)    GFR calc Af Amer 30 (*)    All other components within normal limits  OSMOLALITY, URINE - Abnormal; Notable for the following:    Osmolality, Ur 227 (*)    All other components within normal limits  GLUCOSE, CAPILLARY - Abnormal; Notable for the following:    Glucose-Capillary 58 (*)    All other components within normal limits  GLUCOSE, CAPILLARY - Abnormal; Notable for the following:    Glucose-Capillary 161 (*)    All other components within normal limits  GLUCOSE, CAPILLARY - Abnormal; Notable for the following:    Glucose-Capillary 129 (*)    All other components within normal limits  BASIC METABOLIC PANEL - Abnormal; Notable for the following:    Glucose, Bld 129 (*)    BUN 35 (*)    Calcium 8.6 (*)    All other components within normal limits  CBC - Abnormal; Notable for the following:    WBC 10.7 (*)    RBC 3.37 (*)    Hemoglobin 10.2 (*)    HCT 31.5 (*)    RDW 19.6 (*)    All other components within normal limits  GLUCOSE, CAPILLARY - Abnormal; Notable for the following:    Glucose-Capillary 139 (*)    All other components within normal limits  GLUCOSE, CAPILLARY - Abnormal; Notable for the following:    Glucose-Capillary 100 (*)    All other components within normal limits  GLUCOSE, CAPILLARY - Abnormal; Notable for the following:    Glucose-Capillary 113 (*)    All other components within normal limits  CBG MONITORING, ED - Abnormal; Notable for the following:    Glucose-Capillary 135 (*)    All other components within normal limits  POTASSIUM, URINE RANDOM  SODIUM, URINE, RANDOM  CREATININE, URINE, RANDOM  GLUCOSE, CAPILLARY  GLUCOSE, CAPILLARY  POC OCCULT BLOOD, ED  ABO/RH  TYPE AND SCREEN  PREPARE RBC (CROSSMATCH)    Imaging Review No  results found. I have personally reviewed and evaluated these images and lab results as part of my medical decision-making.   EKG Interpretation   Date/Time:  Friday March 26 2015 18:19:30 EST Ventricular Rate:  120 PR Interval:    QRS Duration: 102 QT Interval:  333 QTC Calculation: 470 R Axis:   42 Text Interpretation:  Atrial fibrillation Borderline low voltage,  extremity leads Nonspecific T abnormalities, lateral leads Artifact in  lead(s) I II III aVR aVL aVF V1 Confirmed by Keion Neels MD, Barbara Cower [phone]) on  03/26/2015 6:33:31 PM      MDM   Final diagnoses:  Hyponatremia  Anemia, unspecified anemia type  Hyperkalemia  Tachycardia    Sent from facility 2/2 anemia and hyperkalemia. Here, tachycardic, found to have low Na, elevated K, slightly low Hb. BP stable. Unsure of cuase, unless from chronic disease as she has severe MS. D/w family on phone, confirmed DNR status but want aggressive care otherwise. Calcium, insulin, d50 given to help with hyperkalemia. Type/screen done in case she needed blood. HR somewhat improved with fluids. Discussed with medicine and she was admitted to ICU for for further management.     Marily Memos, MD 03/28/15 1020  Marily Memos, MD 03/28/15 1021

## 2015-03-26 NOTE — ED Notes (Signed)
Pt sent form [facility] due to abnormal labs. Hgb of 6.8, K of 5.9. Hx of GI bleed.

## 2015-03-27 DIAGNOSIS — E871 Hypo-osmolality and hyponatremia: Secondary | ICD-10-CM

## 2015-03-27 DIAGNOSIS — D62 Acute posthemorrhagic anemia: Secondary | ICD-10-CM

## 2015-03-27 DIAGNOSIS — E875 Hyperkalemia: Principal | ICD-10-CM

## 2015-03-27 DIAGNOSIS — N179 Acute kidney failure, unspecified: Secondary | ICD-10-CM

## 2015-03-27 LAB — GLUCOSE, CAPILLARY
GLUCOSE-CAPILLARY: 100 mg/dL — AB (ref 65–99)
Glucose-Capillary: 89 mg/dL (ref 65–99)
Glucose-Capillary: 161 mg/dL — ABNORMAL HIGH (ref 65–99)
Glucose-Capillary: 129 mg/dL — ABNORMAL HIGH (ref 65–99)
Glucose-Capillary: 139 mg/dL — ABNORMAL HIGH (ref 65–99)

## 2015-03-27 LAB — CBC
HEMATOCRIT: 19.5 % — AB (ref 36.0–46.0)
HEMOGLOBIN: 5.9 g/dL — AB (ref 12.0–15.0)
MCH: 30.3 pg (ref 26.0–34.0)
MCHC: 30.3 g/dL (ref 30.0–36.0)
MCV: 100 fL (ref 78.0–100.0)
Platelets: 215 10*3/uL (ref 150–400)
RBC: 1.95 MIL/uL — AB (ref 3.87–5.11)
RDW: 25.1 % — ABNORMAL HIGH (ref 11.5–15.5)
WBC: 8.1 10*3/uL (ref 4.0–10.5)

## 2015-03-27 LAB — BASIC METABOLIC PANEL
ANION GAP: 10 (ref 5–15)
ANION GAP: 10 (ref 5–15)
BUN: 54 mg/dL — ABNORMAL HIGH (ref 6–20)
BUN: 55 mg/dL — ABNORMAL HIGH (ref 6–20)
CALCIUM: 9.1 mg/dL (ref 8.9–10.3)
CHLORIDE: 100 mmol/L — AB (ref 101–111)
CHLORIDE: 98 mmol/L — AB (ref 101–111)
CO2: 21 mmol/L — AB (ref 22–32)
CO2: 22 mmol/L (ref 22–32)
Calcium: 9 mg/dL (ref 8.9–10.3)
Creatinine, Ser: 1.95 mg/dL — ABNORMAL HIGH (ref 0.44–1.00)
Creatinine, Ser: 2.33 mg/dL — ABNORMAL HIGH (ref 0.44–1.00)
GFR calc Af Amer: 24 mL/min — ABNORMAL LOW (ref >60)
GFR calc non Af Amer: 26 mL/min — ABNORMAL LOW (ref >60)
GFR, EST AFRICAN AMERICAN: 30 mL/min — AB (ref >60)
GFR, EST NON AFRICAN AMERICAN: 21 mL/min — AB (ref >60)
Glucose, Bld: 143 mg/dL — ABNORMAL HIGH (ref 65–99)
Glucose, Bld: 98 mg/dL (ref 65–99)
POTASSIUM: 4.7 mmol/L (ref 3.5–5.1)
POTASSIUM: 4.9 mmol/L (ref 3.5–5.1)
SODIUM: 130 mmol/L — AB (ref 135–145)
Sodium: 131 mmol/L — ABNORMAL LOW (ref 135–145)

## 2015-03-27 LAB — POTASSIUM, URINE RANDOM: POTASSIUM UR: 32 mmol/L

## 2015-03-27 LAB — OSMOLALITY, URINE: Osmolality, Ur: 227 mOsm/kg — ABNORMAL LOW (ref 300–900)

## 2015-03-27 LAB — PREPARE RBC (CROSSMATCH)

## 2015-03-27 LAB — MRSA PCR SCREENING: MRSA BY PCR: POSITIVE — AB

## 2015-03-27 MED ORDER — CHLORHEXIDINE GLUCONATE CLOTH 2 % EX PADS
6.0000 | MEDICATED_PAD | Freq: Every day | CUTANEOUS | Status: DC
  Administered 2015-03-27 – 2015-03-28 (×2): 6 via TOPICAL

## 2015-03-27 MED ORDER — MUPIROCIN 2 % EX OINT
1.0000 "application " | TOPICAL_OINTMENT | Freq: Two times a day (BID) | CUTANEOUS | Status: DC
  Administered 2015-03-27 – 2015-03-28 (×4): 1 via NASAL
  Filled 2015-03-27: qty 22

## 2015-03-27 MED ORDER — CHLORHEXIDINE GLUCONATE 0.12 % MT SOLN
15.0000 mL | Freq: Two times a day (BID) | OROMUCOSAL | Status: DC
  Administered 2015-03-27 – 2015-03-28 (×2): 15 mL via OROMUCOSAL
  Filled 2015-03-27: qty 15

## 2015-03-27 MED ORDER — LEVETIRACETAM 100 MG/ML PO SOLN
500.0000 mg | Freq: Two times a day (BID) | ORAL | Status: DC
  Administered 2015-03-27 – 2015-03-28 (×2): 500 mg
  Filled 2015-03-27 (×4): qty 5

## 2015-03-27 MED ORDER — CETYLPYRIDINIUM CHLORIDE 0.05 % MT LIQD
7.0000 mL | Freq: Two times a day (BID) | OROMUCOSAL | Status: DC
  Administered 2015-03-27 – 2015-03-28 (×3): 7 mL via OROMUCOSAL

## 2015-03-27 MED ORDER — SODIUM CHLORIDE 0.9 % IV SOLN: Freq: Once | INTRAVENOUS | Status: DC

## 2015-03-27 NOTE — Progress Notes (Signed)
Blood consent obtained via telephone from patients sister - darlene Brooke.  Verified with Glade Lloyd, RN

## 2015-03-27 NOTE — Progress Notes (Signed)
[facility] PROGRESS NOTE  MAHA FISCHEL ZOX:[id number] DOB: April 28, 1950 DOA: 03/26/2015 PCP: Colon Branch, MD  Assessment/Plan: Hyperkalemia -Resolved with IV fluids  Anemia of chronic disease -Due to end-stage multiple sclerosis. -Hemoglobin is 5.9, will receive 2 units of PRBCs today.  Acute renal failure -Improving with IV fluids. Continue to monitor renal function  Severe dehydration with hyponatremia -Currently receiving IV fluids. -Would benefit from free water via PEG.  End-stage multiple sclerosis -Poor long-term prognosis, is nonverbal, uses a PEG for nutrition. -Strongly recommend palliative care follow-up at SNF.  Stage III and 4 sacral decubitus ulcers -Examined in presence of RN. Will order some wound care dressings pending wound consult.  Insulin-dependent Diabetes mellitus -Control, continue current regimen and adjust as needed  Code Status: DO NOT RESUSCITATE Family Communication: Will attempt to contact family later today  Disposition Plan: Back to SNF when ready, anticipate 24-48 hours   Consultants:  None   Antibiotics:  None   Subjective: Nonverbal  Objective: Filed Vitals:   03/27/15 0700 03/27/15 0752 03/27/15 0800 03/27/15 0900  BP: 123/67  123/70 130/73  Pulse: 119     Temp:  99.8 F (37.7 C)    TempSrc:  Axillary    Resp: Height:      Weight:      SpO2: 98%       Intake/Output Summary (Last 24 hours) at 03/27/15 1005 Last data filed at 03/27/15 0900  Gross per 24 hour  Intake 1577.08 ml  Output   1500 ml  Net  77.08 ml   Filed Weights   03/26/15 2133 03/27/15 0400  Weight: 61.5 kg (135 lb 9.3 oz) 60.9 kg (134 lb 4.2 oz)    Exam:   General:  Awake, nonverbal  Cardiovascular: Regular rate and rhythm  Respiratory: Clear to auscultation bilaterally  Abdomen: Soft, nontender, nondistended, PEG in place  Extremities: 2+ edema bilaterally   Neurologic:  Flaccid paralysis, nonverbal  Data  Reviewed: Basic Metabolic Panel:  Recent Labs Lab 03/26/15 1648 03/27/15 0012 03/27/15 0410  NA 128* 130* 131*  K 6.5* 4.9 4.7  CL 91* 98* 100*  CO2 23 22 21*  GLUCOSE 97 98 143*  BUN 61* 55* 54*  CREATININE 2.51* 2.33* 1.95*  CALCIUM 9.6 9.0 9.1   Liver Function Tests:  Recent Labs Lab 03/26/15 1648  AST 64*  ALT 27  ALKPHOS 170*  BILITOT 1.1  PROT 7.8  ALBUMIN 1.9*   No results for input(s): LIPASE, AMYLASE in the last 168 hours. No results for input(s): AMMONIA in the last 168 hours. CBC:  Recent Labs Lab 03/26/15 1648 03/27/15 0410  WBC 8.0 8.1  NEUTROABS 4.3  --   HGB 7.6* 5.9*  HCT 24.6* 19.5*  MCV 98.0 100.0  PLT 235 215   Cardiac Enzymes: No results for input(s): CKTOTAL, CKMB, CKMBINDEX, TROPONINI in the last 168 hours. BNP (last 3 results) No results for input(s): BNP in the last 8760 hours.  ProBNP (last 3 results) No results for input(s): PROBNP in the last 8760 hours.  CBG:  Recent Labs Lab 03/26/15 1954 03/26/15 2241 03/26/15 2321 03/27/15 0100 03/27/15 0727  GLUCAP 135* 58* 96 89 161*    Recent Results (from the past 240 hour(s))  MRSA PCR Screening     Status: Abnormal   Collection Time: 03/26/15  9:23 PM  Result Value Ref Range Status   MRSA by PCR POSITIVE (A) NEGATIVE Final    Comment:  The GeneXpert MRSA Assay (FDA approved for NASAL specimens only), is one component of a comprehensive MRSA colonization surveillance program. It is not intended to diagnose MRSA infection nor to guide or monitor treatment for MRSA infections. RESULT CALLED TO, READ BACK BY AND VERIFIED WITH:  LEE,B @ 0059 ON 03/27/15 BY WOODIE,J      Studies: No results found.  Scheduled Meds: . sodium chloride   Intravenous Once  . sodium chloride   Intravenous Once  . antiseptic oral rinse  7 mL Mouth Rinse q12n4p  . cefTRIAXone (ROCEPHIN)  IV  1 g Intravenous Q24H  . chlorhexidine  15 mL Mouth Rinse BID  . Chlorhexidine Gluconate  Cloth  6 each Topical Q0600  . feeding supplement (JEVITY 1.5 CAL/FIBER)  1,000 mL Per Tube Q24H  . feeding supplement (PRO-STAT SUGAR FREE 64)  30 mL Per Tube BID  . insulin aspart  0-15 Units Subcutaneous TID WC  . insulin aspart  0-5 Units Subcutaneous QHS  . insulin glargine  10 Units Subcutaneous QHS  . [START ON 03/30/2015] interferon beta-1a  30 mcg Intramuscular Q7 days  . lacosamide  100 mg Per Tube BID  . levETIRAcetam  500 mg Oral BID  . levothyroxine  137 mcg Oral QAC breakfast  . mupirocin ointment  1 application Nasal BID  . oxyCODONE  5 mg Per Tube Q8H  . pantoprazole (PROTONIX) IV  40 mg Intravenous QHS   Continuous Infusions: . sodium chloride 125 mL/hr at 03/27/15 0719    Active Problems:   Multiple sclerosis (HCC)   AKI (acute kidney injury) (HCC)   Hyperglycemia   Stage III pressure ulcer of sacral region (HCC)   Hyperkalemia   Dehydration with hyponatremia   Acute blood loss anemia    Time spent: 25 minutes. Greater than 50% of this time was spent in direct contact with the patient coordinating care.    Chaya Jan  [facility] Pager [phone]  If 7PM-7AM, please contact night-coverage at [email or web address], password [facility] 03/27/2015, 10:05 AM  LOS: 1 day

## 2015-03-27 NOTE — Progress Notes (Signed)
Attempted to swab mouth, patient pulls away and refuses to open mouth at this time.  Moisturizer applied to lips and dry skin wiped away.

## 2015-03-27 NOTE — Progress Notes (Signed)
Hgb 5.9 down from 7.6 MD notified.

## 2015-03-27 NOTE — Progress Notes (Signed)
Initial Nutrition Assessment  DOCUMENTATION CODES:  Not applicable  INTERVENTION:  When medically able, re-initiate TF: Osmolite 1.2 @ 20 ml/hr via PEG and increase by 10 ml every 2 hours to goal rate of 45 ml/hr.   Tube feeding regimen provides 1296 kcal (100% of needs), 60 grams of protein, and 886 ml of H2O. If no IV fluids to be used. Add 125 ml flush q 6 hrs to provide addition 500 ml fluid.   Recommend Liquid MVI w/ min or VIT C + Zinc to provide substrate for wound healing.   NUTRITION DIAGNOSIS:  Increased nutrient needs related to wound healing as evidenced by estimated nutritional requirements for this condition  GOAL:  Patient will meet greater than or equal to 90% of their needs  MONITOR:  Labs, Weight trends, TF tolerance, Skin, I & O's  REASON FOR ASSESSMENT:  Consult Assessment of nutrition requirement/status  ASSESSMENT:  64 y/o female PMHx HTN, depression and most significantly a Previous stroke secondary to severe MS seizure disorder with chronic left-sided hemiparesis, aphasia , and  Dysphagia. She was recently hospitalized for sepsis secondary to stage IV sacral ulcer and acute blood loss anemia. She presents from SNF due to abnormal lab value: K:5.9. On admit it was 6.5.   RD operating remotely. Per notes, pt has AKI likely secondary to Dehydration. Also w/ at least 1 stage 3 PU. Pt was seen by RD on 12/12. Family declined hospice care and was started on TF last admission. Her weight measurement would indicate that the TF has had a beneficial affect on her nutritional status as she has gained >10 lbs in the past 3 weeks. HOWEVER, edema was noted by nursing and graded at +2 pitting.   On the surface, Pt's nutritional status would appear to be improving. No order given to initiate TF, will order once given.   NFPE: Unable to assess  Diet Order:     Skin:  Dry, flaky, Stage 3 PU to sacrum and buttocks  Last BM:  Unknown  Height:  Ht Readings from Last 1  Encounters:  03/26/15 5' (1.524 m)   Weight:  Wt Readings from Last 1 Encounters:  03/27/15 134 lb 4.2 oz (60.9 kg)   Wt Readings from Last 10 Encounters:  03/27/15 134 lb 4.2 oz (60.9 kg)  03/09/15 122 lb 12.8 oz (55.702 kg)  05/21/14 143 lb (64.864 kg)  12/02/11 143 lb 11.8 oz (65.2 kg)  09/10/11 154 lb 12.2 oz (70.2 kg)  04/13/11 200 lb (90.719 kg)   Ideal Body Weight:  45.45 kg  BMI:  Body mass index is 26.22 kg/(m^2).  Estimated Nutritional Needs:  Kcal:  1200-1400 kcals (20-23 kcal/kg bw) Protein:  55-68 g (1.2-1.5 g/kg ibw) Fluid:  >1.4 liters fluid  EDUCATION NEEDS:  No education needs identified at this time  Christophe Louis RD, LDN Nutrition Pager: [phone] 03/27/2015 8:31 AM

## 2015-03-27 NOTE — Progress Notes (Signed)
Patient with slight fever, 100.4 orally.  Blood administration has also been delayed d/t loss of IV access.  Working with vascular access team at this time.  MD made aware

## 2015-03-28 LAB — BASIC METABOLIC PANEL
ANION GAP: 7 (ref 5–15)
BUN: 35 mg/dL — AB (ref 6–20)
CHLORIDE: 108 mmol/L (ref 101–111)
CO2: 26 mmol/L (ref 22–32)
Calcium: 8.6 mg/dL — ABNORMAL LOW (ref 8.9–10.3)
Creatinine, Ser: 0.95 mg/dL (ref 0.44–1.00)
GFR calc Af Amer: >60 mL/min (ref >60)
Glucose, Bld: 129 mg/dL — ABNORMAL HIGH (ref 65–99)
POTASSIUM: 3.7 mmol/L (ref 3.5–5.1)
SODIUM: 141 mmol/L (ref 135–145)

## 2015-03-28 LAB — GLUCOSE, CAPILLARY
GLUCOSE-CAPILLARY: 105 mg/dL — AB (ref 65–99)
Glucose-Capillary: 113 mg/dL — ABNORMAL HIGH (ref 65–99)

## 2015-03-28 LAB — CBC
HEMATOCRIT: 31.5 % — AB (ref 36.0–46.0)
HEMOGLOBIN: 10.2 g/dL — AB (ref 12.0–15.0)
MCH: 30.3 pg (ref 26.0–34.0)
MCHC: 32.4 g/dL (ref 30.0–36.0)
MCV: 93.5 fL (ref 78.0–100.0)
Platelets: 231 10*3/uL (ref 150–400)
RBC: 3.37 MIL/uL — AB (ref 3.87–5.11)
RDW: 19.6 % — AB (ref 11.5–15.5)
WBC: 10.7 10*3/uL — AB (ref 4.0–10.5)

## 2015-03-28 MED ORDER — PANTOPRAZOLE SODIUM 40 MG PO PACK
40.0000 mg | PACK | Freq: Every day | ORAL | Status: DC
  Filled 2015-03-28: qty 20

## 2015-03-28 NOTE — Progress Notes (Signed)
PT TRANSFERRING  BACK TO [facility]IA EMS. TRANSFER REPORT CALLED TO [facility] AND [facility], PT ALERT BUT NONVERBAL. IV'S HAVE BEEN D/C'D. FOLEY CATH PATENT.Marland KitchenLEFT IN PLEASE BY PAT'S REQUEST. VVS. NO RESP DISTRESS. NO OBSERVE  D SX OF BLEEDING

## 2015-03-28 NOTE — NC FL2 (Signed)
[place] MEDICAID FL2 LEVEL OF CARE SCREENING TOOL     IDENTIFICATION  Patient Name: [name] Birthdate: 01-Jul-1950 Sex: female Admission Date (Current Location): 03/26/2015  [place] Number:  [name] [id number] [facility] Facility and Address:  [facility], [address], [place], [place] [address]      Provider Number:    Attending Physician Name and Address:  Micael Hampshire Acost*  Relative Name and Phone Number:       Current Level of Care: Hospital Recommended Level of Care: Skilled Nursing Facility Prior Approval Number:    Date Approved/Denied:   PASRR Number:    Discharge Plan: SNF    Current Diagnoses: Patient Active Problem List   Diagnosis Date Noted  . Hyperkalemia 03/26/2015  . Dehydration with hyponatremia 03/26/2015  . Acute blood loss anemia 03/26/2015  . Palliative care encounter   . UGI bleed 03/04/2015  . HCAP (healthcare-associated pneumonia) 03/03/2015  . Seizure disorder (HCC) 03/03/2015  . Status post insertion of percutaneous endoscopic gastrostomy (PEG) tube (HCC) 03/03/2015  . AKI (acute kidney injury) (HCC) 03/03/2015  . Hypotension (arterial) 03/03/2015  . Hyperglycemia 03/03/2015  . 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 Bradycardia 12/01/2011  . Dysphagia 12/01/2011  . Chronic pain syndrome 12/01/2011  . Lethargy 11/30/2011  . Decreased responsiveness 11/30/2011  . Thrombocytopenia (HCC) 09/08/2011  . Multiple sclerosis (HCC)   . Seizures (HCC)   . Hemiparesis, left (HCC)   . Hypertension   . Hypothyroid   . Depression   . Aphasia   . Obesity   . Muscle weakness     Orientation RESPIRATION BLADDER Height & Weight     (unable to assess)  O2 (2L) Indwelling catheter 5' (152.4 cm) 108 lbs.  BEHAVIORAL SYMPTOMS/MOOD NEUROLOGICAL BOWEL NUTRITION STATUS    Convulsions/Seizures (by Hx) Incontinent Feeding tube  AMBULATORY STATUS COMMUNICATION OF NEEDS Skin    Total Care Non-Verbally (Pt occasionally speaks per sister) Skin abrasions, PU Stage and Appropriate Care (skin tear to right ear.)                       Personal Care Assistance Level of Assistance  Total care       Total Care Assistance: Maximum assistance   Functional Limitations Info  Sight, Hearing Sight Info: Adequate Hearing Info: Adequate Speech Info: Impaired    SPECIAL CARE FACTORS FREQUENCY                       Contractures Contractures Info: Present    Additional Factors Info  Insulin Sliding Scale, Code Status, Allergies, Psychotropic Code Status Info: DNR Allergies Info: NKA           Current Medications (03/28/2015):  This is the current hospital active medication list Current Facility-Administered Medications  Medication Dose Route Frequency Provider Last Rate Last Dose  . 0.9 %  sodium chloride infusion   Intravenous Once Marily Memos, MD   Stopped at 03/27/15 1030  . 0.9 %  sodium chloride infusion  250 mL Intravenous PRN Rhona Raider Stinson, DO      . 0.9 %  sodium chloride infusion   Intravenous Continuous Levie Heritage, DO 125 mL/hr at 03/28/15 1100    . 0.9 %  sodium chloride infusion   Intravenous Once Henderson Cloud, MD      . acetaminophen (TYLENOL) tablet 650 mg  650 mg Oral Q6H PRN Rhona Raider  Stinson, DO   650 mg at 03/27/15 1506  . antiseptic oral rinse (CPC / CETYLPYRIDINIUM CHLORIDE 0.05%) solution 7 mL  7 mL Mouth Rinse q12n4p Estela Isaiah Blakes, MD   7 mL at 03/28/15 1200  . cefTRIAXone (ROCEPHIN) 1 g in dextrose 5 % 50 mL IVPB  1 g Intravenous Q24H Rhona Raider Stinson, DO   1 g at 03/28/15 0025  . chlorhexidine (PERIDEX) 0.12 % solution 15 mL  15 mL Mouth Rinse BID Henderson Cloud, MD   15 mL at 03/28/15 1000  . Chlorhexidine Gluconate Cloth 2 % PADS 6 each  6 each Topical Q0600 Levie Heritage, DO   6 each at 03/28/15 0545  . feeding supplement (JEVITY 1.5 CAL/FIBER) liquid 1,000 mL  1,000 mL Per Tube Q24H  Rhona Raider Stinson, DO   1,000 mL at 03/28/15 0055  . feeding supplement (PRO-STAT SUGAR FREE 64) liquid 30 mL  30 mL Per Tube BID Rhona Raider Stinson, DO   30 mL at 03/28/15 1000  . insulin aspart (novoLOG) injection 0-15 Units  0-15 Units Subcutaneous TID WC Levie Heritage, DO   2 Units at 03/27/15 1722  . insulin aspart (novoLOG) injection 0-5 Units  0-5 Units Subcutaneous QHS Rhona Raider Stinson, DO      . insulin glargine (LANTUS) injection 10 Units  10 Units Subcutaneous QHS Rhona Raider Stinson, DO   10 Units at 03/27/15 2332  . [START ON 03/30/2015] interferon beta-1a (AVONEX) injection 30 mcg  30 mcg Intramuscular Q7 days Levie Heritage, DO      . lacosamide (VIMPAT) tablet 100 mg  100 mg Per Tube BID Rhona Raider Stinson, DO   100 mg at 03/28/15 1000  . levETIRAcetam (KEPPRA) 100 MG/ML solution 500 mg  500 mg Per Tube BID Henderson Cloud, MD   500 mg at 03/28/15 1000  . levothyroxine (SYNTHROID, LEVOTHROID) tablet 137 mcg  137 mcg Oral QAC breakfast Levie Heritage, DO   137 mcg at 03/28/15 0800  . LORazepam (ATIVAN) tablet 1 mg  1 mg Oral Q4H PRN Rhona Raider Stinson, DO      . mupirocin ointment (BACTROBAN) 2 % 1 application  1 application Nasal BID Levie Heritage, DO   1 application at 03/28/15 1000  . ondansetron (ZOFRAN) injection 4 mg  4 mg Intravenous Q6H PRN Rhona Raider Stinson, DO      . oxyCODONE (Oxy IR/ROXICODONE) immediate release tablet 5 mg  5 mg Per Tube Q8H Rhona Raider Stinson, DO   5 mg at 03/28/15 0544  . pantoprazole sodium (PROTONIX) 40 mg/20 mL oral suspension 40 mg  40 mg Per Tube QHS Estela Isaiah Blakes, MD         Discharge Medications: Please see discharge summary for a list of discharge medications.  Relevant Imaging Results:  Relevant Lab Results:   Additional Information From SNF  Lindalee Huizinga J, LCSW

## 2015-03-28 NOTE — Discharge Summary (Signed)
Physician Discharge Summary  [name] ZOX:[id number] DOB: [birth year] DOA: 03/26/2015  PCP: Colon Branch, MD  Admit date: 03/26/2015 Discharge date: 03/28/2015  Time spent: 45 minutes  Recommendations for Outpatient Follow-up:  -Will be discharged back to SNF today. -Would recommend continued palliative care discussions with family given poor quality of life and poor overall prognosis.   Discharge Diagnoses:  Active Problems:   Multiple sclerosis (HCC)   AKI (acute kidney injury) (HCC)   Hyperglycemia   Stage III pressure ulcer of sacral region [facility])   Hyperkalemia   Dehydration with hyponatremia   Acute blood loss anemia   Discharge Condition: Stable  Filed Weights   03/26/15 2133 03/27/15 0400 03/28/15 0500  Weight: 61.5 kg (135 lb 9.3 oz) 60.9 kg (134 lb 4.2 oz) 61.5 kg (135 lb 9.3 oz)    History of present illness:  As per Dr. Adrian Blackwater 12/30: [name] is a 65 y.o. female  with a history of end-stage multiple sclerosis, previous stroke with chronic left-sided hemiparesis, chronic expressive aphasia and dysphagia secondary to severe multiple sclerosis seizure disorder, chronic pain syndrome, and hypothyroidism, with a recent hospitalization for sepsis secondary to stage IV sacral ulcer and acute blood loss anemia. She presents from [facility] skilled nursing facility with a report of abnormal lab. The patient had routine blood work, which showed a potassium of 5.9. The patient was brought to the emergency department for evaluation. Here the patient's potassium is 6.5 and slightly hemolyzed. Patient is aphasic and is unable to provide any history. From the nursing home, there is no accounts of melena, rectal bleeding, or any other bleeding.  Hospital Course:   Hyperkalemia -Resolved with IV fluids  Anemia of chronic disease -Due to end-stage multiple sclerosis. -Hemoglobin is 5.9, received 2 units of PRBCs on 03/27/2015. -Hemoglobin is 10.2 on day of  discharge.  Acute renal failure -Resolved with IV fluids.  Severe dehydration with hyponatremia -Hyponatremia is resolved with IV fluids, would benefit from free water via PEG.   End-stage multiple sclerosis -Poor long-term prognosis, is nonverbal, uses a PEG for nutrition. -Strongly recommend palliative care follow-up at SNF.  Stage III and 4 sacral decubitus ulcers -Examined in presence of RN. Will order some wound care dressings pending wound consult.  Insulin-dependent Diabetes mellitus -Control, continue current regimen and adjust as needed   Procedures:  None   Consultations:  None  Discharge Instructions  Discharge Instructions    Diet - low sodium heart healthy    Complete by:  As directed      Increase activity slowly    Complete by:  As directed             Medication List    TAKE these medications        acetaminophen 650 MG CR tablet  Commonly known as:  TYLENOL  650 mg by Enteral route daily as needed for pain.     BIOTIN PO  5,000 mcg by Enteral route daily.     feeding supplement (PRO-STAT SUGAR FREE 64) Liqd  30 mLs by Enteral route 2 (two) times daily.     insulin aspart 100 UNIT/ML injection  Commonly known as:  novoLOG  Sliding-scale NovoLog 2 times a day to 3 times a day: CBG 70-120 no units. CBG 151-200 give 2 units. CBG 201-250 give 3 units. CBG 251-300 give 6 units. CBG 301-350 give 8 units. CBG 351-400 give 10 units. CBG greater than 400 give 12 units and call M.D.  insulin glargine 100 UNIT/ML injection  Commonly known as:  LANTUS  Inject 0.1 mLs (10 Units total) into the skin at bedtime.     interferon beta-1a 30 MCG/0.5ML injection  Commonly known as:  AVONEX  Inject 30 mcg into the muscle every 7 (seven) days. On Tuesday     Lacosamide 100 MG Tabs  Place 1 tablet (100 mg total) into feeding tube 2 (two) times daily.     levETIRAcetam 500 MG tablet  Commonly known as:  KEPPRA  Take 1 tablet (500 mg total) by mouth 2  (two) times daily.     levothyroxine 137 MCG tablet  Commonly known as:  SYNTHROID, LEVOTHROID  137 mcg by Enteral route daily before breakfast.     LORazepam 2 MG/ML concentrated solution  Commonly known as:  ATIVAN  Place 0.5 mLs (1 mg total) into feeding tube every 4 (four) hours as needed for seizure. Sign or symptoms of seizure activity     oxyCODONE 5 MG immediate release tablet  Commonly known as:  Oxy IR/ROXICODONE  Place 1 tablet (5 mg total) into feeding tube every 8 (eight) hours.     pantoprazole 40 MG tablet  Commonly known as:  PROTONIX  40 mg by Enteral route daily.     senna 8.6 MG tablet  Commonly known as:  SENOKOT  1 tablet by Enteral route 2 (two) times daily.     vitamin C 500 MG tablet  Commonly known as:  ASCORBIC ACID  500 mg by Enteral route 2 (two) times daily.     zinc sulfate 220 MG capsule  220 mg by Enteral route daily.       No Known Allergies    The results of significant diagnostics from this hospitalization (including imaging, microbiology, ancillary and laboratory) are listed below for reference.    Significant Diagnostic Studies: Dg Chest 1 View  03/06/2015  CLINICAL DATA:  Pneumonia.  Short of breath. EXAM: CHEST 1 VIEW COMPARISON:  Radiograph 03/04/2015 FINDINGS: The patient is angled in position. Interval extubation. Normal cardiac silhouette. Moderate RIGHT effusion noted. No pneumothorax. No focal airspace disease. Nipple shadow noted on the LEFT. IMPRESSION: 1. Interval extubation. 2. Increase in RIGHT pleural effusion. 3. No overt infiltrate identified. Electronically Signed   By: Genevive Bi M.D.   On: 03/06/2015 08:40   Dg Chest Port 1 View  03/04/2015  CLINICAL DATA:  65 year old female with endotracheal tube placement. EXAM: PORTABLE CHEST 1 VIEW COMPARISON:  Chest radiograph dated 09/30/2010. FINDINGS: Endotracheal tube with tip approximately 1.7 cm above the carina. The tip of the endotracheal tube while above the carina  it is tilted towards the right mainstem bronchus recommend retraction and repositioning by approximately 3-4 cm. Single-view of the chest demonstrates bibasilar interstitial prominence, likely atelectatic changes. Pneumonia is less likely. Clinical correlation and follow-up recommended. No focal consolidation, pleural effusion, or pneumothorax. The cardiac silhouette is within normal limits. The osseous structures are grossly unremarkable. Prior IMPRESSION: Endotracheal tube above the carina. Recommend retraction by approximately 3 - 4 cm for better positioning. Electronically Signed   By: Elgie Collard M.D.   On: 03/04/2015 02:01   Dg Chest Portable 1 View  03/03/2015  CLINICAL DATA:  Fever.  Recent PEG tube placement EXAM: PORTABLE CHEST 1 VIEW COMPARISON:  05/21/2014 FINDINGS: Bibasilar infiltrates right greater than left. Small right effusion. Negative for heart failure. Heart size within normal limits. IMPRESSION: Bibasilar infiltrates right greater left consistent with pneumonia. Small right effusion. Electronically Signed  By: Marlan Palau M.D.   On: 03/03/2015 12:00    Microbiology: Recent Results (from the past 240 hour(s))  MRSA PCR Screening     Status: Abnormal   Collection Time: 03/26/15  9:23 PM  Result Value Ref Range Status   MRSA by PCR POSITIVE (A) NEGATIVE Final    Comment:        The GeneXpert MRSA Assay (FDA approved for NASAL specimens only), is one component of a comprehensive MRSA colonization surveillance program. It is not intended to diagnose MRSA infection nor to guide or monitor treatment for MRSA infections. RESULT CALLED TO, READ BACK BY AND VERIFIED WITH:  LEE,B @ 0059 ON 03/27/15 BY WOODIE,J      Labs: Basic Metabolic Panel:  Recent Labs Lab 03/26/15 1648 03/27/15 0012 03/27/15 0410 03/28/15 0736  NA 128* 130* 131* 141  K 6.5* 4.9 4.7 3.7  CL 91* 98* 100* 108  CO2 23 22 21* 26  GLUCOSE 97 98 143* 129*  BUN 61* 55* 54* 35*  CREATININE  2.51* 2.33* 1.95* 0.95  CALCIUM 9.6 9.0 9.1 8.6*   Liver Function Tests:  Recent Labs Lab 03/26/15 1648  AST 64*  ALT 27  ALKPHOS 170*  BILITOT 1.1  PROT 7.8  ALBUMIN 1.9*   No results for input(s): LIPASE, AMYLASE in the last 168 hours. No results for input(s): AMMONIA in the last 168 hours. CBC:  Recent Labs Lab 03/26/15 1648 03/27/15 0410 03/28/15 0736  WBC 8.0 8.1 10.7*  NEUTROABS 4.3  --   --   HGB 7.6* 5.9* 10.2*  HCT 24.6* 19.5* 31.5*  MCV 98.0 100.0 93.5  PLT 235 215 231   Cardiac Enzymes: No results for input(s): CKTOTAL, CKMB, CKMBINDEX, TROPONINI in the last 168 hours. BNP: BNP (last 3 results) No results for input(s): BNP in the last 8760 hours.  ProBNP (last 3 results) No results for input(s): PROBNP in the last 8760 hours.  CBG:  Recent Labs Lab 03/27/15 0727 03/27/15 1206 03/27/15 1651 03/27/15 2127 03/28/15 0739  GLUCAP 161* 129* 139* 100* 113*       Signed:  HERNANDEZ ACOSTA,Holden Maniscalco  [facility] Pager: [phone] 03/28/2015, 11:28 AM

## 2015-03-29 LAB — TYPE AND SCREEN
ABO/RH(D): O POS
ANTIBODY SCREEN: NEGATIVE
UNIT DIVISION: 0
Unit division: 0

## 2015-04-12 ENCOUNTER — Encounter (HOSPITAL_COMMUNITY): Payer: Self-pay | Admitting: Emergency Medicine

## 2015-04-12 ENCOUNTER — Emergency Department (HOSPITAL_COMMUNITY): Payer: Medicare Other

## 2015-04-12 ENCOUNTER — Inpatient Hospital Stay (HOSPITAL_COMMUNITY)
Admission: EM | Admit: 2015-04-12 | Discharge: 2015-04-15 | DRG: 871 | Disposition: A | Discharge status: Skilled Nursing Facility | Payer: Medicare Other | Attending: Family Medicine | Admitting: Family Medicine

## 2015-04-12 DIAGNOSIS — Z79899 Other long term (current) drug therapy: Secondary | ICD-10-CM

## 2015-04-12 DIAGNOSIS — Z931 Gastrostomy status: Secondary | ICD-10-CM

## 2015-04-12 DIAGNOSIS — I69320 Aphasia following cerebral infarction: Secondary | ICD-10-CM

## 2015-04-12 DIAGNOSIS — A419 Sepsis, unspecified organism: Secondary | ICD-10-CM | POA: Diagnosis not present

## 2015-04-12 DIAGNOSIS — E119 Type 2 diabetes mellitus without complications: Secondary | ICD-10-CM | POA: Diagnosis present

## 2015-04-12 DIAGNOSIS — K219 Gastro-esophageal reflux disease without esophagitis: Secondary | ICD-10-CM | POA: Diagnosis present

## 2015-04-12 DIAGNOSIS — R569 Unspecified convulsions: Secondary | ICD-10-CM

## 2015-04-12 DIAGNOSIS — G40301 Generalized idiopathic epilepsy and epileptic syndromes, not intractable, with status epilepticus: Secondary | ICD-10-CM

## 2015-04-12 DIAGNOSIS — I69354 Hemiplegia and hemiparesis following cerebral infarction affecting left non-dominant side: Secondary | ICD-10-CM

## 2015-04-12 DIAGNOSIS — G9341 Metabolic encephalopathy: Secondary | ICD-10-CM | POA: Diagnosis present

## 2015-04-12 DIAGNOSIS — G40911 Epilepsy, unspecified, intractable, with status epilepticus: Secondary | ICD-10-CM

## 2015-04-12 DIAGNOSIS — J189 Pneumonia, unspecified organism: Secondary | ICD-10-CM

## 2015-04-12 DIAGNOSIS — R1310 Dysphagia, unspecified: Secondary | ICD-10-CM

## 2015-04-12 DIAGNOSIS — Z66 Do not resuscitate: Secondary | ICD-10-CM | POA: Diagnosis present

## 2015-04-12 DIAGNOSIS — Z7401 Bed confinement status: Secondary | ICD-10-CM

## 2015-04-12 DIAGNOSIS — G40901 Epilepsy, unspecified, not intractable, with status epilepticus: Secondary | ICD-10-CM | POA: Diagnosis present

## 2015-04-12 DIAGNOSIS — N390 Urinary tract infection, site not specified: Secondary | ICD-10-CM | POA: Diagnosis present

## 2015-04-12 DIAGNOSIS — Z794 Long term (current) use of insulin: Secondary | ICD-10-CM

## 2015-04-12 DIAGNOSIS — E039 Hypothyroidism, unspecified: Secondary | ICD-10-CM | POA: Diagnosis present

## 2015-04-12 DIAGNOSIS — G8194 Hemiplegia, unspecified affecting left nondominant side: Secondary | ICD-10-CM | POA: Diagnosis present

## 2015-04-12 DIAGNOSIS — I10 Essential (primary) hypertension: Secondary | ICD-10-CM | POA: Diagnosis present

## 2015-04-12 DIAGNOSIS — G35 Multiple sclerosis: Secondary | ICD-10-CM | POA: Diagnosis present

## 2015-04-12 DIAGNOSIS — L89153 Pressure ulcer of sacral region, stage 3: Secondary | ICD-10-CM | POA: Diagnosis present

## 2015-04-12 LAB — COMPREHENSIVE METABOLIC PANEL
ALT: 34 U/L (ref 14–54)
ANION GAP: 8 (ref 5–15)
AST: 59 U/L — ABNORMAL HIGH (ref 15–41)
Albumin: 1.6 g/dL — ABNORMAL LOW (ref 3.5–5.0)
Alkaline Phosphatase: 201 U/L — ABNORMAL HIGH (ref 38–126)
BUN: 13 mg/dL (ref 6–20)
CHLORIDE: 99 mmol/L — AB (ref 101–111)
CO2: 28 mmol/L (ref 22–32)
Calcium: 8.6 mg/dL — ABNORMAL LOW (ref 8.9–10.3)
Creatinine, Ser: 0.52 mg/dL (ref 0.44–1.00)
Glucose, Bld: 98 mg/dL (ref 65–99)
POTASSIUM: 3.7 mmol/L (ref 3.5–5.1)
SODIUM: 135 mmol/L (ref 135–145)
Total Bilirubin: 0.4 mg/dL (ref 0.3–1.2)
Total Protein: 6.9 g/dL (ref 6.5–8.1)

## 2015-04-12 LAB — CBC WITH DIFFERENTIAL/PLATELET
Basophils Absolute: 0.1 10*3/uL (ref 0.0–0.1)
Basophils Relative: 0 %
EOS ABS: 0.4 10*3/uL (ref 0.0–0.7)
Eosinophils Relative: 3 %
HEMATOCRIT: 30.9 % — AB (ref 36.0–46.0)
HEMOGLOBIN: 9.5 g/dL — AB (ref 12.0–15.0)
LYMPHS ABS: 2.2 10*3/uL (ref 0.7–4.0)
LYMPHS PCT: 19 %
MCH: 29.2 pg (ref 26.0–34.0)
MCHC: 30.7 g/dL (ref 30.0–36.0)
MCV: 95.1 fL (ref 78.0–100.0)
MONOS PCT: 10 %
Monocytes Absolute: 1.2 10*3/uL — ABNORMAL HIGH (ref 0.1–1.0)
NEUTROS ABS: 8.1 10*3/uL — AB (ref 1.7–7.7)
NEUTROS PCT: 68 %
Platelets: 562 10*3/uL — ABNORMAL HIGH (ref 150–400)
RBC: 3.25 MIL/uL — AB (ref 3.87–5.11)
RDW: 17.4 % — ABNORMAL HIGH (ref 11.5–15.5)
WBC: 12 10*3/uL — AB (ref 4.0–10.5)

## 2015-04-12 LAB — TROPONIN I

## 2015-04-12 MED ORDER — DEXAMETHASONE SODIUM PHOSPHATE 10 MG/ML IJ SOLN
10.0000 mg | Freq: Once | INTRAMUSCULAR | Status: AC
  Administered 2015-04-13: 10 mg via INTRAVENOUS
  Filled 2015-04-12: qty 1

## 2015-04-12 MED ORDER — ACETAMINOPHEN 650 MG RE SUPP
RECTAL | Status: AC
  Administered 2015-04-13: 650 mg via RECTAL
  Filled 2015-04-12: qty 1

## 2015-04-12 MED ORDER — LORAZEPAM 2 MG/ML IJ SOLN
1.0000 mg | Freq: Once | INTRAMUSCULAR | Status: AC
  Administered 2015-04-12: 1 mg via INTRAVENOUS

## 2015-04-12 MED ORDER — DEXTROSE 5 % IV SOLN
2.0000 g | Freq: Once | INTRAVENOUS | Status: AC
  Administered 2015-04-13: 2 g via INTRAVENOUS
  Filled 2015-04-12: qty 2

## 2015-04-12 MED ORDER — AMPICILLIN SODIUM 2 G IJ SOLR
2.0000 g | Freq: Once | INTRAMUSCULAR | Status: AC
  Administered 2015-04-13: 2 g via INTRAVENOUS
  Filled 2015-04-12: qty 2000

## 2015-04-12 MED ORDER — LEVETIRACETAM IN NACL 1000 MG/100ML IV SOLN
1000.0000 mg | Freq: Once | INTRAVENOUS | Status: AC
  Administered 2015-04-12: 1000 mg via INTRAVENOUS
  Filled 2015-04-12: qty 100

## 2015-04-12 MED ORDER — SODIUM CHLORIDE 0.9 % IV BOLUS (SEPSIS)
1000.0000 mL | INTRAVENOUS | Status: AC
  Administered 2015-04-13 (×2): 1000 mL via INTRAVENOUS

## 2015-04-12 MED ORDER — VANCOMYCIN HCL IN DEXTROSE 1-5 GM/200ML-% IV SOLN
1000.0000 mg | Freq: Once | INTRAVENOUS | Status: AC
  Administered 2015-04-13: 1000 mg via INTRAVENOUS
  Filled 2015-04-12: qty 200

## 2015-04-12 NOTE — ED Notes (Signed)
Per EMS rectal temp 102

## 2015-04-12 NOTE — ED Notes (Signed)
Per EMS pt from [facility] per staff pt has been seizing x 45 minutes, given 2mg  Ativan at facility, Ems started IV gave Ativan 1MG . Pt continues to have seizures. MD at the bedside.

## 2015-04-12 NOTE — ED Provider Notes (Signed)
CSN: [id number]     Arrival date & time 04/12/15  2249 History   First MD Initiated Contact with Patient 04/12/15 2249     Chief Complaint  Patient presents with  . Seizures     (Consider location/radiation/quality/duration/timing/severity/associated sxs/prior Treatment) HPI Comments: The patient is a 65 year old female, she is chronically debilitated with a history of multiple sclerosis, seizure disorder, dehydration, left hemiparesis, thrombocytopenia, stroke, she is bedbound, she has a large sacral decubitus ulcer. She presents after having ongoing seizures for over 30 minutes at her nursing facility through which time she was given 2 mg of intramuscular Ativan. Paramedics were finally called, they found the patient to be febrile, in status epilepticus and gave another milligram of Ativan IV. The patient is unable to give any history, level V caveat.  Patient is a 65 y.o. female presenting with seizures. The history is provided by the patient.  Seizures   Past Medical History  Diagnosis Date  . Multiple sclerosis (HCC)   . Seizures (HCC)   . Dysphagia   . Dehydration   . Hypertension   . Hemiparesis, left (HCC)   . Pneumonia   . Thrombocytopenia (HCC)   . Aphasia   . Depression   . Hypothyroid   . Esophageal reflux   . Stroke (HCC)   . Lack of coordination   . Contracture of ankle and foot joint   . Obesity   . Muscle weakness   . Dysphagia 12/01/2011  . Chronic pain syndrome 12/01/2011  . GERD (gastroesophageal reflux disease)   . Status post insertion of percutaneous endoscopic gastrostomy (PEG) tube (HCC) 03/01/2015    at [facility]  . 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 Acidosis   . Aphasia    History reviewed. No pertinent past surgical history. No family history on file. Social History  Substance Use Topics  . Smoking status: Never Smoker   . Smokeless tobacco: None  . Alcohol Use: No   OB History    No data available     Review  of Systems  Unable to perform ROS: Acuity of condition  Neurological: Positive for seizures.      Allergies  Review of patient's allergies indicates no known allergies.  Home Medications   Prior to Admission medications   Medication Sig Start Date End Date Taking? Authorizing Provider  acetaminophen (TYLENOL) 650 MG CR tablet 650 mg by Enteral route daily as needed for pain.    Historical Provider, MD  Amino Acids-Protein Hydrolys (FEEDING SUPPLEMENT, PRO-STAT SUGAR FREE 64,) LIQD 30 mLs by Enteral route 2 (two) times daily.    Historical Provider, MD  BIOTIN PO 5,000 mcg by Enteral route daily.     Historical Provider, MD  insulin aspart (NOVOLOG) 100 UNIT/ML injection Sliding-scale NovoLog 2 times a day to 3 times a day: CBG 70-120 no units. CBG 151-200 give 2 units. CBG 201-250 give 3 units. CBG 251-300 give 6 units. CBG 301-350 give 8 units. CBG 351-400 give 10 units. CBG greater than 400 give 12 units and call M.D. Patient taking differently: Inject 2-4 Units into the skin 3 (three) times daily with meals. Sliding-scale NovoLog 2 times a day to 3 times a day: CBG 70-120 no units. CBG 151-200 give 2 units. CBG 201-250 give 3 units. CBG 251-300 give 6 units. CBG 301-350 give 8 units. CBG 351-400 give 10 units. CBG greater than 400 give 12 units and call M.D. 03/09/15   Elliot Cousin, MD  insulin glargine (LANTUS) 100 UNIT/ML injection Inject 0.1 mLs (10 Units total) into the skin at bedtime. 03/09/15   Elliot Cousin, MD  interferon beta-1a (AVONEX) 30 MCG/0.5ML injection Inject 30 mcg into the muscle every 7 (seven) days. On Tuesday    Historical Provider, MD  lacosamide 100 MG TABS Place 1 tablet (100 mg total) into feeding tube 2 (two) times daily. Patient taking differently: 100 mg by Enteral route 2 (two) times daily.  03/09/15   Elliot Cousin, MD  levETIRAcetam (KEPPRA) 500 MG tablet Take 1 tablet (500 mg total) by mouth 2 (two) times daily. Patient taking differently: 500 mg by  Enteral route 2 (two) times daily.  03/09/15   Elliot Cousin, MD  levothyroxine (SYNTHROID, LEVOTHROID) 137 MCG tablet 137 mcg by Enteral route daily before breakfast.     Historical Provider, MD  LORazepam (ATIVAN) 2 MG/ML concentrated solution Place 0.5 mLs (1 mg total) into feeding tube every 4 (four) hours as needed for seizure. Sign or symptoms of seizure activity 03/09/15   Elliot Cousin, MD  oxyCODONE (OXY IR/ROXICODONE) 5 MG immediate release tablet Place 1 tablet (5 mg total) into feeding tube every 8 (eight) hours. 03/09/15   Elliot Cousin, MD  pantoprazole (PROTONIX) 40 MG tablet 40 mg by Enteral route daily.    Historical Provider, MD  senna (SENOKOT) 8.6 MG tablet 1 tablet by Enteral route 2 (two) times daily.     Historical Provider, MD  vitamin C (ASCORBIC ACID) 500 MG tablet 500 mg by Enteral route 2 (two) times daily.     Historical Provider, MD  zinc sulfate 220 MG capsule 220 mg by Enteral route daily.    Historical Provider, MD   BP 113/73 mmHg  Pulse 126  Resp 24  SpO2 100% Physical Exam  Constitutional: She appears well-developed and well-nourished. She appears distressed.  HENT:  Head: Normocephalic and atraumatic.  Teeth clenched, actively seizing  Eyes: Conjunctivae are normal. Right eye exhibits no discharge. Left eye exhibits no discharge. No scleral icterus.  Neck:  Ill appearing, stiff  Cardiovascular: Regular rhythm, normal heart sounds and intact distal pulses.  Exam reveals no gallop and no friction rub.   No murmur heard. Tachycardic to 140  Pulmonary/Chest: Breath sounds normal. She is in respiratory distress. She has no wheezes. She has no rales.  Abdominal: Soft. Bowel sounds are normal. She exhibits no distension and no mass. There is no tenderness.  Musculoskeletal: She exhibits edema.  Stiffness in all 4 extremities, rhythmic contractures  Neurological:  The patient is actively seizing, she is unable to follow commands, she has eye twitching, head  jerking, rhythmic arm jerking, stiffness, does not respond to painful stimuli, no speech  Skin:  Large open sacral decubitus ulcer, some foul smell, no drainage  Psychiatric: She has a normal mood and affect. Her behavior is normal.  Nursing note and vitals reviewed.   ED Course  Procedures (including critical care time) Labs Review Labs Reviewed  COMPREHENSIVE METABOLIC PANEL - Abnormal; Notable for the following:    Chloride 99 (*)    Calcium 8.6 (*)    Albumin 1.6 (*)    AST 59 (*)    Alkaline Phosphatase 201 (*)    All other components within normal limits  CBC WITH DIFFERENTIAL/PLATELET - Abnormal; Notable for the following:    WBC 12.0 (*)    RBC 3.25 (*)    Hemoglobin 9.5 (*)    HCT 30.9 (*)    RDW 17.4 (*)  Platelets 562 (*)    Neutro Abs 8.1 (*)    Monocytes Absolute 1.2 (*)    All other components within normal limits  URINALYSIS, ROUTINE W REFLEX MICROSCOPIC (NOT AT [facility]) - Abnormal; Notable for the following:    APPearance HAZY (*)    Hgb urine dipstick LARGE (*)    Protein, ur 30 (*)    Nitrite POSITIVE (*)    Leukocytes, UA LARGE (*)    All other components within normal limits  URINE MICROSCOPIC-ADD ON - Abnormal; Notable for the following:    Squamous Epithelial / LPF 0-5 (*)    Bacteria, UA MANY (*)    Casts GRANULAR CAST (*)    All other components within normal limits  I-STAT CHEM 8, ED - Abnormal; Notable for the following:    Chloride 98 (*)    Creatinine, Ser 0.40 (*)    Glucose, Bld 101 (*)    Hemoglobin 10.2 (*)    HCT 30.0 (*)    All other components within normal limits  CSF CULTURE  CULTURE, BLOOD (ROUTINE X 2)  CULTURE, BLOOD (ROUTINE X 2)  URINE CULTURE  TROPONIN I  CSF CELL COUNT WITH DIFFERENTIAL  CSF CELL COUNT WITH DIFFERENTIAL  CRYPTOCOCCAL ANTIGEN, CSF  HERPES SIMPLEX VIRUS(HSV) DNA BY PCR  PROTEIN AND GLUCOSE, CSF  GLUCOSE, CSF  PROTEIN, CSF  BLOOD GAS, VENOUS  CK  I-STAT CG4 LACTIC ACID, ED    Imaging Review No  results found. I have personally reviewed and evaluated these images and lab results as part of my medical decision-making.    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 Blinda Leatherwood  The patient will get broad-spectrum antibiotics, lab workup for source of fever, she received 2 more milligrams of Ativan as well as a gram of Keppra at my order upon arrival.  Constant cardiac monitoring, there is no CT scan available at the hospital this evening however I feel that her seizures are probably related to the infection that she has. I have reviewed her resuscitation status, she is not to be intubated or to receive CPR however she has wish to receive IV medications and respiratory assist such as BiPAP or CPAP. We'll proceed with admission to the hospital as the patient at high risk for decline. The patient is critically ill, critical care provided  D/w Dr. Sharl Ma who requests transfer of pt to [facility] as there are not any Step Down or ICU beds available at this hospital.  The pt has stopped seizing after meds as below.  Meds given in ED:  Medications  acetaminophen (TYLENOL) 650 MG suppository (not administered)  sodium chloride 0.9 % bolus 1,000 mL (not administered)  dexamethasone (DECADRON) injection 10 mg (not administered)  cefTRIAXone (ROCEPHIN) 2 g in dextrose 5 % 50 mL IVPB (not administered)  vancomycin (VANCOCIN) IVPB 1000 mg/200 mL premix (not administered)  ampicillin (OMNIPEN) 2 g in sodium chloride 0.9 % 50 mL IVPB (not administered)  LORazepam (ATIVAN) injection 1 mg (1 mg Intravenous Given 04/12/15 2252)  LORazepam (ATIVAN) injection 1 mg (1 mg Intravenous Given 04/12/15 2259)  levETIRAcetam (KEPPRA) IVPB 1000 mg/100 mL premix (1,000 mg Intravenous Given 04/12/15  2300)    CRITICAL CARE Performed by: Eber Hong D Total critical care time: 35  minutes Critical care time was exclusive of separately billable procedures and treating other patients. Critical care was necessary to treat or prevent imminent or life-threatening deterioration. Critical care was time spent personally by me on the following activities: development of treatment plan with patient and/or surrogate as well as nursing, discussions with consultants, evaluation of patient's response to treatment, examination of patient, obtaining history from patient or surrogate, ordering and performing treatments and interventions, ordering and review of laboratory studies, ordering and review of radiographic studies, pulse oximetry and re-evaluation of patient's condition.     Eber Hong, MD 04/13/15 [phone]

## 2015-04-13 ENCOUNTER — Inpatient Hospital Stay (HOSPITAL_COMMUNITY): Payer: Medicare Other

## 2015-04-13 DIAGNOSIS — N390 Urinary tract infection, site not specified: Secondary | ICD-10-CM | POA: Diagnosis not present

## 2015-04-13 DIAGNOSIS — L89153 Pressure ulcer of sacral region, stage 3: Secondary | ICD-10-CM

## 2015-04-13 DIAGNOSIS — R1310 Dysphagia, unspecified: Secondary | ICD-10-CM | POA: Diagnosis not present

## 2015-04-13 DIAGNOSIS — E039 Hypothyroidism, unspecified: Secondary | ICD-10-CM | POA: Diagnosis not present

## 2015-04-13 DIAGNOSIS — G9341 Metabolic encephalopathy: Secondary | ICD-10-CM | POA: Diagnosis not present

## 2015-04-13 DIAGNOSIS — A419 Sepsis, unspecified organism: Secondary | ICD-10-CM | POA: Diagnosis present

## 2015-04-13 DIAGNOSIS — K219 Gastro-esophageal reflux disease without esophagitis: Secondary | ICD-10-CM | POA: Diagnosis not present

## 2015-04-13 DIAGNOSIS — R569 Unspecified convulsions: Secondary | ICD-10-CM

## 2015-04-13 DIAGNOSIS — G35 Multiple sclerosis: Secondary | ICD-10-CM | POA: Diagnosis not present

## 2015-04-13 DIAGNOSIS — E119 Type 2 diabetes mellitus without complications: Secondary | ICD-10-CM | POA: Diagnosis not present

## 2015-04-13 DIAGNOSIS — Z515 Encounter for palliative care: Secondary | ICD-10-CM | POA: Diagnosis not present

## 2015-04-13 DIAGNOSIS — Z794 Long term (current) use of insulin: Secondary | ICD-10-CM | POA: Diagnosis not present

## 2015-04-13 DIAGNOSIS — Z79899 Other long term (current) drug therapy: Secondary | ICD-10-CM | POA: Diagnosis not present

## 2015-04-13 DIAGNOSIS — G8194 Hemiplegia, unspecified affecting left nondominant side: Secondary | ICD-10-CM

## 2015-04-13 DIAGNOSIS — Z66 Do not resuscitate: Secondary | ICD-10-CM | POA: Diagnosis not present

## 2015-04-13 DIAGNOSIS — Z931 Gastrostomy status: Secondary | ICD-10-CM | POA: Diagnosis not present

## 2015-04-13 DIAGNOSIS — Z7401 Bed confinement status: Secondary | ICD-10-CM | POA: Diagnosis not present

## 2015-04-13 DIAGNOSIS — I10 Essential (primary) hypertension: Secondary | ICD-10-CM | POA: Diagnosis not present

## 2015-04-13 DIAGNOSIS — G40901 Epilepsy, unspecified, not intractable, with status epilepticus: Secondary | ICD-10-CM | POA: Diagnosis present

## 2015-04-13 DIAGNOSIS — I69320 Aphasia following cerebral infarction: Secondary | ICD-10-CM | POA: Diagnosis not present

## 2015-04-13 DIAGNOSIS — E031 Congenital hypothyroidism without goiter: Secondary | ICD-10-CM | POA: Diagnosis not present

## 2015-04-13 DIAGNOSIS — I69354 Hemiplegia and hemiparesis following cerebral infarction affecting left non-dominant side: Secondary | ICD-10-CM | POA: Diagnosis not present

## 2015-04-13 LAB — BASIC METABOLIC PANEL
ANION GAP: 11 (ref 5–15)
BUN: 10 mg/dL (ref 6–20)
CALCIUM: 8.9 mg/dL (ref 8.9–10.3)
CO2: 26 mmol/L (ref 22–32)
Chloride: 106 mmol/L (ref 101–111)
Creatinine, Ser: 0.53 mg/dL (ref 0.44–1.00)
Glucose, Bld: 96 mg/dL (ref 65–99)
Potassium: 3.9 mmol/L (ref 3.5–5.1)
Sodium: 143 mmol/L (ref 135–145)

## 2015-04-13 LAB — I-STAT CHEM 8, ED
BUN: 15 mg/dL (ref 6–20)
CHLORIDE: 98 mmol/L — AB (ref 101–111)
CREATININE: 0.4 mg/dL — AB (ref 0.44–1.00)
Calcium, Ion: 1.17 mmol/L (ref 1.13–1.30)
Glucose, Bld: 101 mg/dL — ABNORMAL HIGH (ref 65–99)
HEMATOCRIT: 30 % — AB (ref 36.0–46.0)
Hemoglobin: 10.2 g/dL — ABNORMAL LOW (ref 12.0–15.0)
POTASSIUM: 4.8 mmol/L (ref 3.5–5.1)
Sodium: 135 mmol/L (ref 135–145)
TCO2: 30 mmol/L (ref 0–100)

## 2015-04-13 LAB — GLUCOSE, CAPILLARY
GLUCOSE-CAPILLARY: 81 mg/dL (ref 65–99)
GLUCOSE-CAPILLARY: 89 mg/dL (ref 65–99)
GLUCOSE-CAPILLARY: 105 mg/dL — AB (ref 65–99)
GLUCOSE-CAPILLARY: 97 mg/dL (ref 65–99)
Glucose-Capillary: 99 mg/dL (ref 65–99)
Glucose-Capillary: 114 mg/dL — ABNORMAL HIGH (ref 65–99)

## 2015-04-13 LAB — CSF CELL COUNT WITH DIFFERENTIAL
RBC Count, CSF: 173 /mm3 — ABNORMAL HIGH
TUBE #: 4
WBC, CSF: 0 /mm3 (ref 0–5)

## 2015-04-13 LAB — URINALYSIS, ROUTINE W REFLEX MICROSCOPIC
BILIRUBIN URINE: NEGATIVE
Glucose, UA: NEGATIVE mg/dL
Ketones, ur: NEGATIVE mg/dL
NITRITE: POSITIVE — AB
PH: 8 (ref 5.0–8.0)
Protein, ur: 30 mg/dL — AB
SPECIFIC GRAVITY, URINE: 1.01 (ref 1.005–1.030)

## 2015-04-13 LAB — CRYPTOCOCCAL ANTIGEN, CSF: Crypto Ag: NEGATIVE

## 2015-04-13 LAB — BLOOD GAS, VENOUS
ACID-BASE EXCESS: 4.2 mmol/L — AB (ref 0.0–2.0)
Bicarbonate: 28.1 mEq/L — ABNORMAL HIGH (ref 20.0–24.0)
FIO2: 100
O2 SAT: 90.1 %
TCO2: 11.4 mmol/L (ref 0–100)
pCO2, Ven: 38.1 mmHg — ABNORMAL LOW (ref 45.0–50.0)
pH, Ven: 7.475 — ABNORMAL HIGH (ref 7.250–7.300)
pO2, Ven: 59 mmHg — ABNORMAL HIGH (ref 30.0–45.0)

## 2015-04-13 LAB — URINE MICROSCOPIC-ADD ON

## 2015-04-13 LAB — CBC
HEMATOCRIT: 34.5 % — AB (ref 36.0–46.0)
Hemoglobin: 10.7 g/dL — ABNORMAL LOW (ref 12.0–15.0)
MCH: 29.5 pg (ref 26.0–34.0)
MCHC: 31 g/dL (ref 30.0–36.0)
MCV: 95 fL (ref 78.0–100.0)
PLATELETS: 594 10*3/uL — AB (ref 150–400)
RBC: 3.63 MIL/uL — AB (ref 3.87–5.11)
RDW: 17.5 % — AB (ref 11.5–15.5)
WBC: 12.3 10*3/uL — AB (ref 4.0–10.5)

## 2015-04-13 LAB — LACTIC ACID, PLASMA
LACTIC ACID, VENOUS: 1.5 mmol/L (ref 0.5–2.0)
Lactic Acid, Venous: 1.7 mmol/L (ref 0.5–2.0)

## 2015-04-13 LAB — CK: CK TOTAL: 106 U/L (ref 38–234)

## 2015-04-13 LAB — GLUCOSE, CSF: GLUCOSE CSF: 61 mg/dL (ref 40–70)

## 2015-04-13 LAB — PROTEIN, CSF: Total  Protein, CSF: 41 mg/dL (ref 15–45)

## 2015-04-13 LAB — MRSA PCR SCREENING: MRSA by PCR: POSITIVE — AB

## 2015-04-13 LAB — I-STAT CG4 LACTIC ACID, ED: LACTIC ACID, VENOUS: 2.26 mmol/L — AB (ref 0.5–2.0)

## 2015-04-13 MED ORDER — LACOSAMIDE 50 MG PO TABS: 100.0000 mg | ORAL_TABLET | Freq: Two times a day (BID) | ORAL | Status: DC

## 2015-04-13 MED ORDER — MUPIROCIN 2 % EX OINT
1.0000 | TOPICAL_OINTMENT | Freq: Two times a day (BID) | CUTANEOUS | Status: DC
Start: 2015-04-13 — End: 2015-04-15
  Administered 2015-04-13 – 2015-04-15 (×4): 1 via NASAL
  Filled 2015-04-13: qty 22

## 2015-04-13 MED ORDER — SODIUM CHLORIDE 0.9 % IV SOLN
500.0000 mg | Freq: Two times a day (BID) | INTRAVENOUS | Status: DC
  Administered 2015-04-13 (×2): 500 mg via INTRAVENOUS
  Filled 2015-04-13 (×4): qty 5

## 2015-04-13 MED ORDER — LORAZEPAM 2 MG/ML IJ SOLN: 2.0000 mg | INTRAMUSCULAR | Status: DC | PRN

## 2015-04-13 MED ORDER — COLLAGENASE 250 UNIT/GM EX OINT
TOPICAL_OINTMENT | Freq: Every day | CUTANEOUS | Status: DC
  Administered 2015-04-14 – 2015-04-15 (×2): via TOPICAL
  Filled 2015-04-13 (×2): qty 30

## 2015-04-13 MED ORDER — INSULIN ASPART 100 UNIT/ML ~~LOC~~ SOLN: 0.0000 [IU] | Freq: Three times a day (TID) | SUBCUTANEOUS | Status: DC

## 2015-04-13 MED ORDER — DEXTROSE 5 % IV SOLN
1.0000 g | INTRAVENOUS | Status: DC
  Administered 2015-04-13 – 2015-04-14 (×2): 1 g via INTRAVENOUS
  Filled 2015-04-13 (×3): qty 10

## 2015-04-13 MED ORDER — ACETAMINOPHEN 325 MG PO TABS: 650.0000 mg | ORAL_TABLET | Freq: Four times a day (QID) | ORAL | Status: DC | PRN

## 2015-04-13 MED ORDER — ACETAMINOPHEN 650 MG RE SUPP
650.0000 mg | Freq: Once | RECTAL | Status: AC
  Administered 2015-04-13: 650 mg via RECTAL

## 2015-04-13 MED ORDER — ACETAMINOPHEN 650 MG RE SUPP: 650.0000 mg | Freq: Four times a day (QID) | RECTAL | Status: DC | PRN

## 2015-04-13 MED ORDER — ONDANSETRON HCL 4 MG PO TABS: 4.0000 mg | ORAL_TABLET | Freq: Four times a day (QID) | ORAL | Status: DC | PRN

## 2015-04-13 MED ORDER — MORPHINE SULFATE (PF) 2 MG/ML IV SOLN
1.0000 mg | INTRAVENOUS | Status: DC | PRN
  Administered 2015-04-13: 2 mg via INTRAVENOUS
  Filled 2015-04-13: qty 1

## 2015-04-13 MED ORDER — SODIUM CHLORIDE 0.9 % IV SOLN: INTRAVENOUS | Status: DC

## 2015-04-13 MED ORDER — INSULIN ASPART 100 UNIT/ML ~~LOC~~ SOLN
0.0000 [IU] | SUBCUTANEOUS | Status: DC
  Administered 2015-04-14: 1 [IU] via SUBCUTANEOUS

## 2015-04-13 MED ORDER — SODIUM CHLORIDE 0.9 % IV SOLN
INTRAVENOUS | Status: DC
  Administered 2015-04-13 (×2): via INTRAVENOUS

## 2015-04-13 MED ORDER — DEXTROSE 5 % IV SOLN
500.0000 mg | Freq: Three times a day (TID) | INTRAVENOUS | Status: DC
  Administered 2015-04-13 – 2015-04-15 (×6): 500 mg via INTRAVENOUS
  Filled 2015-04-13 (×9): qty 10

## 2015-04-13 MED ORDER — LEVOTHYROXINE SODIUM 25 MCG PO TABS
137.0000 ug | ORAL_TABLET | Freq: Every day | ORAL | Status: DC
  Administered 2015-04-13 – 2015-04-15 (×3): 137 ug
  Filled 2015-04-13 (×4): qty 1

## 2015-04-13 MED ORDER — SODIUM CHLORIDE 0.9 % IV SOLN
2.0000 g | Freq: Once | INTRAVENOUS | Status: AC
  Administered 2015-04-13: 2 g via INTRAVENOUS
  Filled 2015-04-13: qty 2000

## 2015-04-13 MED ORDER — ONDANSETRON HCL 4 MG/2ML IJ SOLN: 4.0000 mg | Freq: Four times a day (QID) | INTRAMUSCULAR | Status: DC | PRN

## 2015-04-13 MED ORDER — LEVETIRACETAM 500 MG PO TABS: 500.0000 mg | ORAL_TABLET | Freq: Two times a day (BID) | ORAL | Status: DC

## 2015-04-13 MED ORDER — CHLORHEXIDINE GLUCONATE CLOTH 2 % EX PADS
6.0000 | MEDICATED_PAD | Freq: Every day | CUTANEOUS | Status: DC
  Administered 2015-04-14 – 2015-04-15 (×2): 6 via TOPICAL

## 2015-04-13 MED ORDER — ONDANSETRON HCL 4 MG/2ML IJ SOLN: 4.0000 mg | Freq: Three times a day (TID) | INTRAMUSCULAR | Status: DC | PRN

## 2015-04-13 MED ORDER — LACOSAMIDE 200 MG/20ML IV SOLN
100.0000 mg | Freq: Two times a day (BID) | INTRAVENOUS | Status: DC
  Administered 2015-04-13 (×2): 100 mg via INTRAVENOUS
  Filled 2015-04-13 (×5): qty 10

## 2015-04-13 NOTE — Progress Notes (Addendum)
Patient admitted after midnight, please see H&P.   Status epilepticus Resolved after patient received Ativan, Keppra 1 g IV 1. Continue Ativan 2 g IV every 4 hours when necessary for seizure CT scan head shows atrophy  Seizure disorder Patient takes Keppra 500 twice a day via ube, Lacosamide 100 milligram tablet twice a day via tube. Will continue these medications Appreciate neuro  Fever/sepsis (elevate LA, leukocytosis) Likely from UTI, patient has indwelling Foley catheter with abnormal UA. Chest x-ray shows pulmonary edema versus pneumonia There was a concern for meningitis, lumbar puncture was done and CSF analysis not impressive Patient empirically started on vancomycin, ampicillin, ceftriaxone but will d/c amp/vanc Acyclovir until HSV back  Diabetes mellitus  Will hold Lantus, and start sliding scale insulin with NovoLog  Large decubitus wound -WOC  -packed by nursing and covered   Patient has been comfort care in past, may need palliative care consult and goals fo care-- 3 hospitalizations in 2 months  Marlin Canary DO

## 2015-04-13 NOTE — H&P (Addendum)
PCP:   Colon Branch, MD   Chief Complaint:  Seizure  HPI: 65 year old female who   has a past medical history of Multiple sclerosis (HCC); Seizures (HCC); Dysphagia; Dehydration; Hypertension; Hemiparesis, left (HCC); Pneumonia; Thrombocytopenia (HCC); Aphasia; Depression; Hypothyroid; Esophageal reflux; Stroke (HCC); Lack of coordination; Contracture of ankle and foot joint; Obesity; Muscle weakness; Dysphagia (12/01/2011); Chronic pain syndrome (12/01/2011); GERD (gastroesophageal reflux disease); Status post insertion of percutaneous endoscopic gastrostomy (PEG) tube (HCC) (03/01/2015); 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  Today was brought to the ED from skilled facility after patient started having seizure which was ongoing for over 30 minutes through which she was given one-time dose of intramuscular Ativan. EMS was called and patient was found to be febrile in status epilepticus and another milligram of Ativan IV was given. Patient is nonverbal at baseline unable to provide any history. In the ED patient again received 2 mg of IV Ativan, 1 g of Keppra IV 1. As patient was febrile, lumbar puncture was done, and patient empirically started on vancomycin, Rocephin, ampicillin. One dose of Decadron 10 mg IV was given.  Patient has indwelling Foley catheter and PEG tube in place. UA showed positive nitrite and too numerous to count WBC.  Allergies:  No Known Allergies    Past Medical History  Diagnosis Date  . Multiple sclerosis (HCC)   . Seizures (HCC)   . Dysphagia   . Dehydration   . Hypertension   . Hemiparesis, left (HCC)   . Pneumonia   . Thrombocytopenia (HCC)   . Aphasia   . Depression   . Hypothyroid   . Esophageal reflux   . Stroke (HCC)   . Lack of coordination   . Contracture of ankle and foot joint   . Obesity   . Muscle weakness   . Dysphagia 12/01/2011  . Chronic pain syndrome 12/01/2011  . GERD (gastroesophageal  reflux disease)   . Status post insertion of percutaneous endoscopic gastrostomy (PEG) tube (HCC) 03/01/2015    at [facility]  . 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 Acidosis   . Aphasia     History reviewed. No pertinent past surgical history.  Prior to Admission medications   Medication Sig Start Date End Date Taking? Authorizing Provider  acetaminophen (TYLENOL) 650 MG CR tablet 650 mg by Enteral route daily as needed for pain.    Historical Provider, MD  Amino Acids-Protein Hydrolys (FEEDING SUPPLEMENT, PRO-STAT SUGAR FREE 64,) LIQD 30 mLs by Enteral route 2 (two) times daily.    Historical Provider, MD  BIOTIN PO 5,000 mcg by Enteral route daily.     Historical Provider, MD  insulin aspart (NOVOLOG) 100 UNIT/ML injection Sliding-scale NovoLog 2 times a day to 3 times a day: CBG 70-120 no units. CBG 151-200 give 2 units. CBG 201-250 give 3 units. CBG 251-300 give 6 units. CBG 301-350 give 8 units. CBG 351-400 give 10 units. CBG greater than 400 give 12 units and call M.D. Patient taking differently: Inject 2-4 Units into the skin 3 (three) times daily with meals. Sliding-scale NovoLog 2 times a day to 3 times a day: CBG 70-120 no units. CBG 151-200 give 2 units. CBG 201-250 give 3 units. CBG 251-300 give 6 units. CBG 301-350 give 8 units. CBG 351-400 give 10 units. CBG greater than 400 give 12 units and call M.D. 03/09/15   Elliot Cousin, MD  insulin glargine (LANTUS) 100 UNIT/ML injection Inject  0.1 mLs (10 Units total) into the skin at bedtime. 03/09/15   Elliot Cousin, MD  interferon beta-1a (AVONEX) 30 MCG/0.5ML injection Inject 30 mcg into the muscle every 7 (seven) days. On Tuesday    Historical Provider, MD  lacosamide 100 MG TABS Place 1 tablet (100 mg total) into feeding tube 2 (two) times daily. Patient taking differently: 100 mg by Enteral route 2 (two) times daily.  03/09/15   Elliot Cousin, MD  levETIRAcetam (KEPPRA) 500 MG tablet Take 1 tablet (500  mg total) by mouth 2 (two) times daily. Patient taking differently: 500 mg by Enteral route 2 (two) times daily.  03/09/15   Elliot Cousin, MD  levothyroxine (SYNTHROID, LEVOTHROID) 137 MCG tablet 137 mcg by Enteral route daily before breakfast.     Historical Provider, MD  LORazepam (ATIVAN) 2 MG/ML concentrated solution Place 0.5 mLs (1 mg total) into feeding tube every 4 (four) hours as needed for seizure. Sign or symptoms of seizure activity 03/09/15   Elliot Cousin, MD  oxyCODONE (OXY IR/ROXICODONE) 5 MG immediate release tablet Place 1 tablet (5 mg total) into feeding tube every 8 (eight) hours. 03/09/15   Elliot Cousin, MD  pantoprazole (PROTONIX) 40 MG tablet 40 mg by Enteral route daily.    Historical Provider, MD  senna (SENOKOT) 8.6 MG tablet 1 tablet by Enteral route 2 (two) times daily.     Historical Provider, MD  vitamin C (ASCORBIC ACID) 500 MG tablet 500 mg by Enteral route 2 (two) times daily.     Historical Provider, MD  zinc sulfate 220 MG capsule 220 mg by Enteral route daily.    Historical Provider, MD    Social History:  reports that she has never smoked. She does not have any smokeless tobacco history on file. She reports that she does not drink alcohol or use illicit drugs.      Review of Systems:  Unable to obtain as patient is nonverbal   Physical Exam: Blood pressure 122/83, pulse 118, resp. rate 21, SpO2 100 %. Constitutional:   Patient is a ill-appearing female, in no acute distress Head: Normocephalic and atraumatic Mouth: Mucus membranes moist Neck: Supple, No Thyromegaly Cardiovascular: RRR, S1 normal, S2 normal Pulmonary/Chest: CTAB, no wheezes, rales, or rhonchi Abdominal: Soft. Non-tender, non-distended, bowel sounds are normal, no masses, organomegaly, or guarding present.  Neurological: Alert, left hemiplegia, contractures noted in upper extremities Extremities : No Cyanosis, Clubbing or Edema  Labs on Admission:  Basic Metabolic  Panel:  Recent Labs Lab 04/12/15 2325 04/12/15 2359  NA 135 135  K 3.7 4.8  CL 99* 98*  CO2 28  --   GLUCOSE 98 101*  BUN 13 15  CREATININE 0.52 0.40*  CALCIUM 8.6*  --    Liver Function Tests:  Recent Labs Lab 04/12/15 2325  AST 59*  ALT 34  ALKPHOS 201*  BILITOT 0.4  PROT 6.9  ALBUMIN 1.6*   CBC:  Recent Labs Lab 04/12/15 2325 04/12/15 2359  WBC 12.0*  --   NEUTROABS 8.1*  --   HGB 9.5* 10.2*  HCT 30.9* 30.0*  MCV 95.1  --   PLT 562*  --    Cardiac Enzymes:  Recent Labs Lab 04/12/15 2325  CKTOTAL 106  TROPONINI <0.03     Radiological Exams on Admission: Dg Chest Port 1 View  04/13/2015  CLINICAL DATA:  Acute onset of seizure. Fever. Code sepsis. Initial encounter. EXAM: PORTABLE CHEST 1 VIEW COMPARISON:  Chest radiograph from 03/06/2015 FINDINGS: Vascular congestion  is noted. Small bilateral pleural effusions are seen. Increased interstitial markings may reflect mild interstitial edema or pneumonia. No pneumothorax is identified. The cardiomediastinal silhouette is normal in size. No acute osseous abnormalities are seen. IMPRESSION: Vascular congestion noted. Small bilateral pleural effusions seen. Increased interstitial markings may reflect mild interstitial edema or pneumonia. Electronically Signed   By: Roanna Raider M.D.   On: 04/13/2015 00:05    EKG: Independently reviewed. Sinus tachycardia   Assessment/Plan Active Problems:   Hemiparesis, left (HCC)   Hypothyroid   Dysphagia   Stage III pressure ulcer of sacral region [facility])   Status epilepticus (HCC)   Seizure (HCC)   UTI (lower urinary tract infection)  Status epilepticus Resolved after patient received Ativan, Keppra 1 g IV 1. Continue Ativan 2 g IV every 4 hours when necessary for seizure Patient will need to be monitored closely in stepdown at [facility] CT scan head ordered for seizure, to be done at [facility]. No CT scan available at [facility] hospital.  Seizure  disorder Patient takes Keppra 500 twice a day via ube, Lacosamide 100 milligram tablet twice a day via tube. Will continue these medications  Fever Likely from UTI, patient has indwelling Foley catheter with abnormal UA. Chest x-ray shows pulmonary edema versus pneumonia There was a concern for meningitis, lumbar puncture was done and CSF analysis ordered. Patient empirically started on vancomycin, ampicillin, ceftriaxone Will continue these medications until CSF analysis is resulted   Diabetes mellitus  Will hold Lantus, and start sliding scale insulin with NovoLog  DVT prophylaxis SCDs  Patient will be transferred to stepdown at [facility] Dr. Joen Laura has accepted the patient  Code status: DO NOT RESUSCITATE  Family discussion: Unable to contact family on phone   Time Spent on Admission: 60 min  [facility] Pager: [phone] 04/13/2015, 12:37 AM  If 7PM-7AM, please contact night-coverage  [email or web address]  Password [email or web address]

## 2015-04-13 NOTE — Consult Note (Signed)
NEURO HOSPITALIST CONSULT NOTE   Referring physician: Dr Eliseo Squires, J Reason for Consult: status epilepticus   HPI:                                                                                                                                          [name] is an 65 y.o. female with a past medical history significant for HTN, MS, stroke, seizure disorder, thrombocytopenia, large sacral decubitus ulcer, transferred to [facility] for further management of SE and concern for meningitis. At baseline patient is non verbal, bed bound, chronically debilitated.  She was evaluated at [facility] yesterday after having ongoing seizures for over 30 minutes at her nursing facility through which time she was given 2 mg of intramuscular Ativan. Paramedics were finally called, they found the patient to be febrile, in status epilepticus and gave another milligram of Ativan IV. CT head was personally reviewed and showed no acute intracranial abnormality. Concern for meningitis in the ED prompted LP with CSF results that are not consistent with infection.  UA showed positive nitrite and too numerous to count WBC. Her chronic AED regimen consists of Keppra 500 twice a day via ube, Lacosamide 100 milligram tablet twice a day via tube. Presently, she is obtunded but open eyes to painful stimuli.   Past Medical History  Diagnosis Date  . Multiple sclerosis ([place])   . Seizures ([place])   . Dysphagia   . Dehydration   . Hypertension   . Hemiparesis, left ([place])   . Pneumonia   . Thrombocytopenia ([place])   . Aphasia   . Depression   . Hypothyroid   . Esophageal reflux   . Stroke ([place])   . Lack of coordination   . Contracture of ankle and foot joint   . Obesity   . Muscle weakness   . Dysphagia 12/01/2011  . Chronic pain syndrome 12/01/2011  . GERD (gastroesophageal reflux disease)   . Status post insertion of percutaneous endoscopic gastrostomy (PEG) tube ([place]) 03/01/2015    at [facility]  . Stage  III pressure ulcer of sacral region ([place]) 03/03/2015  . Acidosis   . Aphasia     History reviewed. No pertinent past surgical history.  No family history on file.  Family History: unable to obtain due to mental status   Social History:  reports that she has never smoked. She does not have any smokeless tobacco history on file. She reports that she does not drink alcohol or use illicit drugs.  No Known Allergies  MEDICATIONS:  Scheduled: . insulin aspart  0-9 Units Subcutaneous TID WC  . lacosamide  100 mg Per Tube BID  . levETIRAcetam  500 mg Oral BID  . levothyroxine  137 mcg Per Tube QAC breakfast     ROS: unable to obtain due to mental status                                                                                                                                     History obtained from chart review  Physical exam:  Constitutional: chronically disabled female who is poorly responsive to painful stimuli. Blood pressure 139/90, pulse 109, temperature 97.5 F (36.4 C), temperature source Oral, resp. rate 24, height '5\' 1"'  (1.549 m), SpO2 98 %. Eyes: no jaundice or exophthalmos.  Head: normocephalic. Neck: supple, no bruits, no JVD. Cardiac: no murmurs. Lungs: clear. Abdomen: soft, no tender, no mass. Extremities: bilateral LE edema, no clubbing, or cyanosis.  Skin: no rash  Neurologic Examination:                                                                                                      General: NAD Mental Status: Obtunded, open eyes to painful stimuli but does not follow commands, non verbal Cranial Nerves: II: Discs were not assessed; Visual fields are problematic to assess at this time, pupils equal, round, reactive to ligh III,IV, VI: ptosis not present, extra-ocular motions intact bilaterally V,VII: smile symmetric, facial light touch  sensation unable to assess VIII: hearing normal bilaterally IX,X: unable to assess due to mental status XI: bilateral shoulder shrug no tested XII: unable to assess Motor: Minimal motor movements upon painful stimuli Generalized spasticity Sensory: minimal reaction to noxious stimuli Deep Tendon Reflexes:  2 all over Plantars: Right: no tested   Left: no tested Cerebellar: Unable to test due to mental status Gait:  Unable to test due to mental status    No results found for: CHOL  Results for orders placed or performed during the hospital encounter of 04/12/15 (from the past 48 hour(s))  Urinalysis, Routine w reflex microscopic (not at [facility])     Status: Abnormal   Collection Time: 04/12/15 10:56 PM  Result Value Ref Range   Color, Urine YELLOW YELLOW   APPearance HAZY (A) CLEAR   Specific Gravity, Urine 1.010 1.005 - 1.030   pH 8.0 5.0 - 8.0   Glucose, UA NEGATIVE NEGATIVE mg/dL   Hgb urine dipstick LARGE (A) NEGATIVE   Bilirubin Urine NEGATIVE NEGATIVE   Ketones,  ur NEGATIVE NEGATIVE mg/dL   Protein, ur 30 (A) NEGATIVE mg/dL   Nitrite POSITIVE (A) NEGATIVE   Leukocytes, UA LARGE (A) NEGATIVE  Urine microscopic-add on     Status: Abnormal   Collection Time: 04/12/15 10:56 PM  Result Value Ref Range   Squamous Epithelial / LPF 0-5 (A) NONE SEEN   WBC, UA TOO NUMEROUS TO COUNT 0 - 5 WBC/hpf   RBC / HPF 6-30 0 - 5 RBC/hpf   Bacteria, UA MANY (A) NONE SEEN   Casts GRANULAR CAST (A) NEGATIVE  Comprehensive metabolic panel     Status: Abnormal   Collection Time: 04/12/15 11:25 PM  Result Value Ref Range   Sodium 135 135 - 145 mmol/L   Potassium 3.7 3.5 - 5.1 mmol/L   Chloride 99 (L) 101 - 111 mmol/L   CO2 28 22 - 32 mmol/L   Glucose, Bld 98 65 - 99 mg/dL   BUN 13 6 - 20 mg/dL   Creatinine, Ser 0.52 0.44 - 1.00 mg/dL   Calcium 8.6 (L) 8.9 - 10.3 mg/dL   Total Protein 6.9 6.5 - 8.1 g/dL   Albumin 1.6 (L) 3.5 - 5.0 g/dL   AST 59 (H) 15 - 41 U/L   ALT 34 14 - 54 U/L    Alkaline Phosphatase 201 (H) 38 - 126 U/L   Total Bilirubin 0.4 0.3 - 1.2 mg/dL   GFR calc non Af Amer >60 >60 mL/min   GFR calc Af Amer >60 >60 mL/min    Comment: (NOTE) The eGFR has been calculated using the CKD EPI equation. This calculation has not been validated in all clinical situations. eGFR's persistently <60 mL/min signify possible Chronic Kidney Disease.    Anion gap 8 5 - 15  CBC WITH DIFFERENTIAL     Status: Abnormal   Collection Time: 04/12/15 11:25 PM  Result Value Ref Range   WBC 12.0 (H) 4.0 - 10.5 K/uL   RBC 3.25 (L) 3.87 - 5.11 MIL/uL   Hemoglobin 9.5 (L) 12.0 - 15.0 g/dL   HCT 30.9 (L) 36.0 - 46.0 %   MCV 95.1 78.0 - 100.0 fL   MCH 29.2 26.0 - 34.0 pg   MCHC 30.7 30.0 - 36.0 g/dL   RDW 17.4 (H) 11.5 - 15.5 %   Platelets 562 (H) 150 - 400 K/uL   Neutrophils Relative % 68 %   Neutro Abs 8.1 (H) 1.7 - 7.7 K/uL   Lymphocytes Relative 19 %   Lymphs Abs 2.2 0.7 - 4.0 K/uL   Monocytes Relative 10 %   Monocytes Absolute 1.2 (H) 0.1 - 1.0 K/uL   Eosinophils Relative 3 %   Eosinophils Absolute 0.4 0.0 - 0.7 K/uL   Basophils Relative 0 %   Basophils Absolute 0.1 0.0 - 0.1 K/uL  Blood Culture (routine x 2)     Status: None (Preliminary result)   Collection Time: 04/12/15 11:25 PM  Result Value Ref Range   Specimen Description BLOOD LEFT HAND    Special Requests      BOTTLES DRAWN AEROBIC AND ANAEROBIC AEB 5CC ANA 4CC   Culture PENDING    Report Status PENDING   Troponin I     Status: None   Collection Time: 04/12/15 11:25 PM  Result Value Ref Range   Troponin I <0.03 <0.031 ng/mL    Comment:        NO INDICATION OF MYOCARDIAL INJURY.   CK     Status: None   Collection Time: 04/12/15  11:25 PM  Result Value Ref Range   Total CK 106 38 - 234 U/L  Blood Culture (routine x 2)     Status: None (Preliminary result)   Collection Time: 04/12/15 11:50 PM  Result Value Ref Range   Specimen Description BLOOD RIGHT FOREARM    Special Requests      BOTTLES DRAWN  AEROBIC AND ANAEROBIC AEB [place] ANA 3CC   Culture PENDING    Report Status PENDING   Blood gas, venous ([facility], [facility], [facility])     Status: Abnormal   Collection Time: 04/12/15 11:50 PM  Result Value Ref Range   FIO2 100.00    Delivery systems NON-REBREATHER OXYGEN MASK    pH, Ven 7.475 (H) 7.250 - 7.300   pCO2, Ven 38.1 (L) 45.0 - 50.0 mmHg   pO2, Ven 59.0 (H) 30.0 - 45.0 mmHg   Bicarbonate 28.1 (H) 20.0 - 24.0 mEq/L   TCO2 11.4 0 - 100 mmol/L   Acid-Base Excess 4.2 (H) 0.0 - 2.0 mmol/L   O2 Saturation 90.1 %   Collection site VEIN    Drawn by DRAWN BY RN    Sample type VENOUS   I-stat chem 8, ed     Status: Abnormal   Collection Time: 04/12/15 11:59 PM  Result Value Ref Range   Sodium 135 135 - 145 mmol/L   Potassium 4.8 3.5 - 5.1 mmol/L   Chloride 98 (L) 101 - 111 mmol/L   BUN 15 6 - 20 mg/dL   Creatinine, Ser 0.40 (L) 0.44 - 1.00 mg/dL   Glucose, Bld 101 (H) 65 - 99 mg/dL   Calcium, Ion 1.17 1.13 - 1.30 mmol/L   TCO2 30 0 - 100 mmol/L   Hemoglobin 10.2 (L) 12.0 - 15.0 g/dL   HCT 30.0 (L) 36.0 - 46.0 %  I-Stat CG4 Lactic Acid, ED  (not at  [facility])     Status: Abnormal   Collection Time: 04/13/15 12:00 AM  Result Value Ref Range   Lactic Acid, Venous 2.26 (HH) 0.5 - 2.0 mmol/L  CSF cell count with differential collection tube #: 4     Status: Abnormal   Collection Time: 04/13/15 12:03 AM  Result Value Ref Range   Tube # 4    Color, CSF COLORLESS COLORLESS   Appearance, CSF CLEAR CLEAR   Supernatant NOT INDICATED    RBC Count, CSF 173 (H) 0 /cu mm    Comment: CORRECTED ON 01/17 AT 0118: PREVIOUSLY REPORTED AS 69   WBC, CSF 0 0 - 5 /cu mm   Segmented Neutrophils-CSF TOO FEW TO COUNT, SMEAR AVAILABLE FOR REVIEW 0 - 6 %   Lymphs, CSF TOO FEW TO COUNT, SMEAR AVAILABLE FOR REVIEW 40 - 80 %   Monocyte-Macrophage-Spinal Fluid TOO FEW TO COUNT, SMEAR AVAILABLE FOR REVIEW 15 - 45 %   Eosinophils, CSF TOO FEW TO COUNT, SMEAR AVAILABLE FOR REVIEW 0 - 1 %  CSF culture     Status: None  (Preliminary result)   Collection Time: 04/13/15 12:03 AM  Result Value Ref Range   Specimen Description CSF    Special Requests NONE    Gram Stain NO ORGANISMS SEEN CYTOSPIN SMEAR     Culture PENDING    Report Status PENDING   Glucose, CSF     Status: None   Collection Time: 04/13/15 12:03 AM  Result Value Ref Range   Glucose, CSF 61 40 - 70 mg/dL  Protein, CSF     Status: None   Collection  Time: 04/13/15 12:03 AM  Result Value Ref Range   Total  Protein, CSF 41 15 - 45 mg/dL  Glucose, capillary     Status: None   Collection Time: 04/13/15  4:10 AM  Result Value Ref Range   Glucose-Capillary 81 65 - 99 mg/dL  CBC     Status: Abnormal   Collection Time: 04/13/15  5:27 AM  Result Value Ref Range   WBC 12.3 (H) 4.0 - 10.5 K/uL   RBC 3.63 (L) 3.87 - 5.11 MIL/uL   Hemoglobin 10.7 (L) 12.0 - 15.0 g/dL   HCT 34.5 (L) 36.0 - 46.0 %   MCV 95.0 78.0 - 100.0 fL   MCH 29.5 26.0 - 34.0 pg   MCHC 31.0 30.0 - 36.0 g/dL   RDW 17.5 (H) 11.5 - 15.5 %   Platelets 594 (H) 150 - 400 K/uL  Basic metabolic panel     Status: None   Collection Time: 04/13/15  5:27 AM  Result Value Ref Range   Sodium 143 135 - 145 mmol/L   Potassium 3.9 3.5 - 5.1 mmol/L   Chloride 106 101 - 111 mmol/L   CO2 26 22 - 32 mmol/L   Glucose, Bld 96 65 - 99 mg/dL   BUN 10 6 - 20 mg/dL   Creatinine, Ser 0.53 0.44 - 1.00 mg/dL   Calcium 8.9 8.9 - 10.3 mg/dL   GFR calc non Af Amer >60 >60 mL/min   GFR calc Af Amer >60 >60 mL/min    Comment: (NOTE) The eGFR has been calculated using the CKD EPI equation. This calculation has not been validated in all clinical situations. eGFR's persistently <60 mL/min signify possible Chronic Kidney Disease.    Anion gap 11 5 - 15  Lactic acid, plasma     Status: None   Collection Time: 04/13/15  5:27 AM  Result Value Ref Range   Lactic Acid, Venous 1.7 0.5 - 2.0 mmol/L    Ct Head Wo Contrast  04/13/2015  CLINICAL DATA:  65 year old female with multiple sclerosis presenting  with seizures EXAM: CT HEAD WITHOUT CONTRAST TECHNIQUE: Contiguous axial images were obtained from the base of the skull through the vertex without intravenous contrast. COMPARISON:  CT dated 11/30/2011 FINDINGS: There is advanced central and global volume loss similar to prior study. Periventricular and deep white matter hypodensities represent chronic microvascular ischemic changes or related to underlying white matter demyelinating disease. There is no intracranial hemorrhage. No mass effect or midline shift identified. The visualized paranasal sinuses and mastoid air cells are well aerated. The calvarium is intact. IMPRESSION: No acute intracranial hemorrhage. Severe atrophy and chronic white matter disease. Electronically Signed   By: Anner Crete M.D.   On: 04/13/2015 05:25   Dg Chest Port 1 View  04/13/2015  CLINICAL DATA:  Acute onset of seizure. Fever. Code sepsis. Initial encounter. EXAM: PORTABLE CHEST 1 VIEW COMPARISON:  Chest radiograph from 03/06/2015 FINDINGS: Vascular congestion is noted. Small bilateral pleural effusions are seen. Increased interstitial markings may reflect mild interstitial edema or pneumonia. No pneumothorax is identified. The cardiomediastinal silhouette is normal in size. No acute osseous abnormalities are seen. IMPRESSION: Vascular congestion noted. Small bilateral pleural effusions seen. Increased interstitial markings may reflect mild interstitial edema or pneumonia. Electronically Signed   By: Garald Balding M.D.   On: 04/13/2015 00:05    Assessment/Plan:  Chronically ill 65 y/o with late stage MS, stroke, seizure disorder, presented to [facility] with GTC-SE and fever but with unimpressive CSF. Still  poorly responsive, concern for NCSE. CT head without acute abnormality. Undergoing EEG now. Continue current AED pending EEG results. Will follow up.   Dorian Pod, MD 04/13/2015, 9:21 AM

## 2015-04-13 NOTE — Progress Notes (Signed)
ANTIBIOTIC CONSULT NOTE-Preliminary  Pharmacy Consult for Ampicillin, ceftriaxone, vancomycin Indication: meningitis  No Known Allergies  Patient Measurements:     Vital Signs: Temp: 99.4 F (37.4 C) (01/17 0148) Temp Source: Rectal (01/17 0148) BP: 125/77 mmHg (01/17 0230) Pulse Rate: 108 (01/17 0230)  Labs:  Recent Labs  04/12/15 2325 04/12/15 2359  WBC 12.0*  --   HGB 9.5* 10.2*  PLT 562*  --   CREATININE 0.52 0.40*    CrCl cannot be calculated (Unknown ideal weight.).  No results for input(s): VANCOTROUGH, VANCOPEAK, VANCORANDOM, GENTTROUGH, GENTPEAK, GENTRANDOM, TOBRATROUGH, TOBRAPEAK, TOBRARND, AMIKACINPEAK, AMIKACINTROU, AMIKACIN in the last 72 hours.   Microbiology: Recent Results (from the past 720 hour(s))  MRSA PCR Screening     Status: Abnormal   Collection Time: 03/26/15  9:23 PM  Result Value Ref Range Status   MRSA by PCR POSITIVE (A) NEGATIVE Final    Comment:        The GeneXpert MRSA Assay (FDA approved for NASAL specimens only), is one component of a comprehensive MRSA colonization surveillance program. It is not intended to diagnose MRSA infection nor to guide or monitor treatment for MRSA infections. RESULT CALLED TO, READ BACK BY AND VERIFIED WITH:  LEE,B @ 0059 ON 03/27/15 BY WOODIE,J   CSF culture     Status: None (Preliminary result)   Collection Time: 04/13/15 12:03 AM  Result Value Ref Range Status   Specimen Description CSF  Final   Special Requests NONE  Final   Gram Stain NO ORGANISMS SEEN CYTOSPIN SMEAR   Final   Culture PENDING  Incomplete   Report Status PENDING  Incomplete    Medical History: Past Medical History  Diagnosis Date  . Multiple sclerosis ([facility])   . Seizures ([facility])   . Dysphagia   . Dehydration   . Hypertension   . Hemiparesis, left ([facility])   . Pneumonia   . Thrombocytopenia ([facility])   . Aphasia   . Depression   . Hypothyroid   . Esophageal reflux   . Stroke ([facility])   . Lack of coordination   .  Contracture of ankle and foot joint   . Obesity   . Muscle weakness   . Dysphagia 12/01/2011  . Chronic pain syndrome 12/01/2011  . GERD (gastroesophageal reflux disease)   . Status post insertion of percutaneous endoscopic gastrostomy (PEG) tube ([facility]) 03/01/2015    at [facility]  . 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 Acidosis   . Aphasia     Medications:  Scheduled:    Assessment: 65 yo female brought to ED from SNF after seizure which lasted over 30 minutes, given Ativan and Keppra. Because pt was febrile, LP performed and empiric abx started.  UA +nitrite, +WBC-too numerous to count  Goal of Therapy:  Vancomycin trough level 15-20 mcg/ml  Plan:  Preliminary review of pertinent patient information completed.  Protocol will be initiated with a one-time dose(s) of ampicillin 2 grams due at 0630 (6 hr after ED dose).  Vancomycin and ceftriaxone given in ED.  Jeani Hawking clinical pharmacist will complete review during morning rounds to assess patient and finalize treatment regimen.  Shelsea Hangartner Scarlett, RPH 04/13/2015,2:44 AM

## 2015-04-13 NOTE — Progress Notes (Signed)
ANTIBIOTIC CONSULT NOTE - INITIAL  Pharmacy Consult for Ceftriaxone and Acyclovir Indication: UTI/possible viral mennigitis   No Known Allergies  Patient Measurements: Height:  (154.9 cm) IBW/kg (Calculated) : 47.8  Labs:  Recent Labs  04/12/15 2325 04/12/15 2359 04/13/15 0527  WBC 12.0*  --  12.3*  HGB 9.5* 10.2* 10.7*  PLT 562*  --  594*  CREATININE 0.52 0.40* 0.53   Microbiology: 1/16 UVx: px (u/a dirty)  1/16 BCx: px  1/16 CSF: ngtd (Glucose 61, TO 41, WBC 0, RBC 173)  Assessment: 22 yoF with hx of seizure disorder transferred from OSH with worsening seizures and concern for meningitis. CSF fluid and cx not indicative of bacterial infection per MD and will plan to continue ceftriaxone for potential UTI (indweliing foley at baseline) and Acyclovir.  Goal of Therapy:  treatment of infection  Plan:  1. Ceftriaxone 1 gram IV Q 24 hours  2. Acyclovir 500 mg IV q8H 3. Await pending micor data and narrow abx as feasible  Pollyann Samples, PharmD, BCPS 04/13/2015, 10:34 AM Pager: [phone]

## 2015-04-13 NOTE — Consult Note (Addendum)
WOC wound consult note Reason for Consult: Consult requested for sacrum and right ischium. Wound type: Sacrum with chronic stage 4 pressure injury; 9X8.5X3cm with undermining from 9:00 o'clock to 3:00 o'clock to 3 cm.  Wound is 90% red, 10% yellow slough, bone palpable, no odor, mod amt yellow drainage. Right ischium with unstageable pressure injury; 4X4X3cm, 80% slough, 20% red, mod amt yellow drainage, no odor. Pressure Ulcer POA: Yes Dressing procedure/placement/frequency: Air mattress to reduce pressure.  Moist gauze packing to sacrum to absorb drainage and promote healing.  Santyl for chemical debridement of nonviable tissue to right ischium.  No family at bedside to discuss plan of care.  Pt has multiple systemic factors which can impair healing. Please re-consult if further assistance is needed.  Thank-you,  Cammie Mcgee MSN, RN, CWOCN, [place], CNS [phone]

## 2015-04-13 NOTE — ED Notes (Signed)
Informed April Robert nurse at [facility] that patient was shipped to [facility] for further treatment.

## 2015-04-13 NOTE — Procedures (Signed)
History: 65 year old female who presented in status epilepticus receiving Ativan and Keppra  Sedation: Recently received Ativan  Technique: This is a 21 channel routine scalp EEG performed at the bedside with bipolar and monopolar montages arranged in accordance to the international 10/20 system of electrode placement. One channel was dedicated to EKG recording.    Background: The background has the appearance of sleep, however sleep spindles are markedly asymmetric being seen much better on the left than right. This pattern is maintained throughout the EEG, though there is mild arousal pattern with increase in theta activity with painful stimulation.  Photic stimulation: Physiologic driving is performed  EEG Abnormalities: 1) asymmetric sleep structures  Clinical Interpretation: This EEG is consistent with a nonspecific right hemispheric dysfunction. There was no seizure or seizure predisposition recorded on this study. Please note that a normal EEG does not preclude the possibility of epilepsy.   Ritta Slot, MD [facility] [phone]  If 7pm- 7am, please page neurology on call as listed in AMION.

## 2015-04-13 NOTE — ED Provider Notes (Addendum)
  Physical Exam  BP 113/73 mmHg  Pulse 126  Resp 24  SpO2 100%  Physical Exam  ED Course  .Lumbar Puncture Date/Time: 04/13/2015 12:11 AM Performed by: Gilda Crease Authorized by: Gilda Crease Consent: The procedure was performed in an emergent situation. Verbal consent not obtained. Patient identity confirmed: arm band and hospital-assigned identification number Time out: Immediately prior to procedure a "time out" was called to verify the correct patient, procedure, equipment, support staff and site/side marked as required. Indications: evaluation for infection and evaluation for altered mental status Anesthesia: local infiltration Local anesthetic: lidocaine 1% without epinephrine Anesthetic total: 3 ml Patient sedated: no Lumbar space: L4-L5 interspace Patient's position: left lateral decubitus Needle gauge: 22 Needle type: spinal needle - Quincke tip Needle length: 3.5 in Number of attempts: 3 Fluid appearance: clear Tubes of fluid: 4 Total volume: 5.5 ml Post-procedure: site cleaned, pressure dressing applied and adhesive bandage applied Patient tolerance: Patient tolerated the procedure well with no immediate complications Comments: Initital attempt was traumatic. Fluid collected in tube 1 was very bloody. Needle clotted and was removed and relocated with new needle, clear fluid obtained. 3 more tubes were collected.      EKG Interpretation  Date/Time:  Monday April 12 2015 23:49:57 EST Ventricular Rate:  132 PR Interval:    QRS Duration: 80 QT Interval:  315 QTC Calculation: 467 R Axis:   22 Text Interpretation:  Sinus tachycardia Otherwise within normal limits Confirmed by Blinda Leatherwood  MD, CHRISTOPHER [phone]) on 04/13/2015 12:27:50 AM           Gilda Crease, MD 04/13/15 2111  Gilda Crease, MD 04/13/15 [phone]

## 2015-04-13 NOTE — Progress Notes (Signed)
EEG completed, results pending. 

## 2015-04-14 DIAGNOSIS — Z515 Encounter for palliative care: Secondary | ICD-10-CM

## 2015-04-14 DIAGNOSIS — Z66 Do not resuscitate: Secondary | ICD-10-CM

## 2015-04-14 LAB — HERPES SIMPLEX VIRUS(HSV) DNA BY PCR
HSV 1 DNA: NEGATIVE
HSV 2 DNA: NEGATIVE

## 2015-04-14 LAB — GLUCOSE, CAPILLARY
GLUCOSE-CAPILLARY: 117 mg/dL — AB (ref 65–99)
GLUCOSE-CAPILLARY: 127 mg/dL — AB (ref 65–99)
Glucose-Capillary: 103 mg/dL — ABNORMAL HIGH (ref 65–99)
Glucose-Capillary: 110 mg/dL — ABNORMAL HIGH (ref 65–99)
Glucose-Capillary: 107 mg/dL — ABNORMAL HIGH (ref 65–99)

## 2015-04-14 LAB — HEMOGLOBIN A1C
HEMOGLOBIN A1C: 5.5 % (ref 4.8–5.6)
MEAN PLASMA GLUCOSE: 111 mg/dL

## 2015-04-14 MED ORDER — PRO-STAT SUGAR FREE PO LIQD
30.0000 mL | Freq: Two times a day (BID) | ORAL | Status: DC
  Administered 2015-04-14 – 2015-04-15 (×2): 30 mL
  Filled 2015-04-14 (×2): qty 30

## 2015-04-14 MED ORDER — OXYCODONE HCL 5 MG PO TABS: 5.0000 mg | ORAL_TABLET | Freq: Three times a day (TID) | ORAL | Status: DC

## 2015-04-14 MED ORDER — BIOTIN 2.5 MG PO TABS: 5.0000 mg | ORAL_TABLET | Freq: Every day | ORAL | Status: DC

## 2015-04-14 MED ORDER — OXYCODONE HCL 5 MG/5ML PO SOLN
5.0000 mg | Freq: Three times a day (TID) | ORAL | Status: DC
Start: 2015-04-14 — End: 2015-04-15
  Administered 2015-04-14 – 2015-04-15 (×4): 5 mg via ORAL
  Filled 2015-04-14 (×4): qty 5

## 2015-04-14 MED ORDER — MORPHINE SULFATE (PF) 2 MG/ML IV SOLN
1.0000 mg | INTRAVENOUS | Status: DC | PRN
  Administered 2015-04-14 – 2015-04-15 (×3): 1 mg via INTRAVENOUS
  Filled 2015-04-14 (×3): qty 1

## 2015-04-14 MED ORDER — LEVETIRACETAM 500 MG PO TABS: 500.0000 mg | ORAL_TABLET | Freq: Two times a day (BID) | ORAL | Status: DC

## 2015-04-14 MED ORDER — SENNA 8.6 MG PO TABS: 1.0000 | ORAL_TABLET | Freq: Two times a day (BID) | ORAL | Status: DC

## 2015-04-14 MED ORDER — FENTANYL 12 MCG/HR TD PT72
12.5000 ug | MEDICATED_PATCH | TRANSDERMAL | Status: DC
  Administered 2015-04-14: 12.5 ug via TRANSDERMAL
  Filled 2015-04-14: qty 1

## 2015-04-14 MED ORDER — PANTOPRAZOLE SODIUM 40 MG PO TBEC: 40.0000 mg | DELAYED_RELEASE_TABLET | Freq: Every day | ORAL | Status: DC

## 2015-04-14 MED ORDER — SODIUM CHLORIDE 0.9 % IV BOLUS (SEPSIS)
500.0000 mL | Freq: Once | INTRAVENOUS | Status: AC
  Administered 2015-04-14: 500 mL via INTRAVENOUS

## 2015-04-14 MED ORDER — SENNOSIDES 8.8 MG/5ML PO SYRP
5.0000 mL | ORAL_SOLUTION | Freq: Two times a day (BID) | ORAL | Status: DC
  Administered 2015-04-14 – 2015-04-15 (×3): 5 mL
  Filled 2015-04-14 (×4): qty 5

## 2015-04-14 MED ORDER — LACOSAMIDE 50 MG PO TABS
100.0000 mg | ORAL_TABLET | Freq: Two times a day (BID) | ORAL | Status: DC
  Administered 2015-04-14 – 2015-04-15 (×3): 100 mg
  Filled 2015-04-14 (×3): qty 2

## 2015-04-14 MED ORDER — JEVITY 1.2 CAL PO LIQD
1000.0000 mL | ORAL | Status: DC
  Administered 2015-04-14: 25 mL/h
  Filled 2015-04-14 (×3): qty 1000

## 2015-04-14 MED ORDER — PANTOPRAZOLE SODIUM 40 MG PO PACK
40.0000 mg | PACK | Freq: Every day | ORAL | Status: DC
  Filled 2015-04-14: qty 20

## 2015-04-14 MED ORDER — PANTOPRAZOLE SODIUM 40 MG PO PACK
40.0000 mg | PACK | Freq: Every day | ORAL | Status: DC
  Administered 2015-04-14 – 2015-04-15 (×2): 40 mg
  Filled 2015-04-14 (×2): qty 20

## 2015-04-14 MED ORDER — INSULIN GLARGINE 100 UNIT/ML ~~LOC~~ SOLN
5.0000 [IU] | Freq: Every day | SUBCUTANEOUS | Status: DC
  Administered 2015-04-14: 5 [IU] via SUBCUTANEOUS
  Filled 2015-04-14 (×2): qty 0.05

## 2015-04-14 MED ORDER — PRO-STAT SUGAR FREE PO LIQD
30.0000 mL | Freq: Two times a day (BID) | ORAL | Status: DC
  Administered 2015-04-14: 30 mL via ORAL
  Filled 2015-04-14: qty 30

## 2015-04-14 MED ORDER — ZINC SULFATE 220 (50 ZN) MG PO CAPS
220.0000 mg | ORAL_CAPSULE | Freq: Every day | ORAL | Status: DC
  Administered 2015-04-14 – 2015-04-15 (×2): 220 mg via ORAL
  Filled 2015-04-14 (×2): qty 1

## 2015-04-14 MED ORDER — VITAMIN C 500 MG PO TABS
500.0000 mg | ORAL_TABLET | Freq: Two times a day (BID) | ORAL | Status: DC
  Administered 2015-04-14 – 2015-04-15 (×3): 500 mg via ORAL
  Filled 2015-04-14 (×3): qty 1

## 2015-04-14 MED ORDER — LEVETIRACETAM 100 MG/ML PO SOLN
500.0000 mg | Freq: Two times a day (BID) | ORAL | Status: DC
  Administered 2015-04-14 – 2015-04-15 (×3): 500 mg
  Filled 2015-04-14 (×5): qty 5

## 2015-04-14 MED ORDER — DEXTROSE-NACL 5-0.9 % IV SOLN
INTRAVENOUS | Status: DC
  Administered 2015-04-14: 11:00:00 via INTRAVENOUS

## 2015-04-14 NOTE — Progress Notes (Signed)
[name]:[id number] DOB: 05-Nov-1950 DOA: 04/12/2015 PCP: Colon Branch, MD  Brief narrative:  65 y/o ? Sz disorder follwed previously Dr. Gerilyn Pilgrim Neurology DM ty II Large Sacral decubitus Htn [place] CVA in the past H/o Severe Multiple sclerosis with Chr Aphasia, Neurogenic bowel, Chr Bed-bound-with a PEG tube in situ H/o Bipolar 1 Hypohtyroidism Chr Pain syndrome on Narcotics  Admitted 03/03/15-Was intubated that admission and found to have a probable Aspiration PNA.  She also had an Upper GI bleed at that admission  Palliative care discussed with family but comfort care was rescinded as patient survived extubatio-e-admitted 03/26/15 with hyperkalemia and contnued discussion c Pallaitive care recomnmended  Re-admitted [facility] with Status epilepticus-Ativan given.  Given Ativan 2, Keppra 1.  LP was performed and patient started on empiric coverage for Meninigitis, and UA was concerning for asymtp bacteriuria   Past medical history-As per Problem list Chart reviewed as below- Reviewed  Consultants:  Palliative care  Procedures:  Lumbar puncture performed on admission 1/17  Antibiotics:  Ampicillin 1/16-1/17  Ceftriaxone 1/16  Vancomycin 1/16-1/17  Acyclovir 1/70   Subjective   More awake alert Nursing informs me patient blood pressure low in the 60s over 40s earlier in the morning Cannot obtain review of systems but responds to hello and wrist beats my words and says "hello " Unable to get review of systems once again   Objective    Interim History: Contacts/Participants in Discussion: Primary Decision Maker: Sisters [name] Buelna and [place] Aller share [place].  Sister Viki Carrera tells me today that her daughter, [name] [phone]) will now help them care for Ms. Gotwalt.   Telemetry: Sinus tachycardia PVCs   Objective: Filed Vitals:   04/14/15 0700 04/14/15 0720 04/14/15 0725 04/14/15 0730  BP: 82/36 71/48 66/33  79/55  Pulse: 106  104 111 112  Temp:      TempSrc:      Resp: 18 17 23 27   Height:      SpO2: 99% 99% 100% 99%    Intake/Output Summary (Last 24 hours) at 04/14/15 0755 Last data filed at 04/14/15 0600  Gross per 24 hour  Intake 2768.75 ml  Output   1900 ml  Net 868.75 ml    Exam:  General: Awake alert but unable to verbalize Cardiovascular: S1-S2 no murmur rub or gallop Respiratory: Clinically clear no added sound Abdomen: Soft nontender nondistended no rebound Skin     but  and large sacral decubiti with edges and floors NeuroCannot appropriately assess  Data Reviewed: Basic Metabolic Panel:  Recent Labs Lab 04/12/15 2325 04/12/15 2359 04/13/15 0527  NA 135 135 143  K 3.7 4.8 3.9  CL 99* 98* 106  CO2 28  --  26  GLUCOSE 98 101* 96  BUN 13 15 10   CREATININE 0.52 0.40* 0.53  CALCIUM 8.6*  --  8.9   Liver Function Tests:  Recent Labs Lab 04/12/15 2325  AST 59*  ALT 34  ALKPHOS 201*  BILITOT 0.4  PROT 6.9  ALBUMIN 1.6*   No results for input(s): LIPASE, AMYLASE in the last 168 hours. No results for input(s): AMMONIA in the last 168 hours. CBC:  Recent Labs Lab 04/12/15 2325 04/12/15 2359 04/13/15 0527  WBC 12.0*  --  12.3*  NEUTROABS 8.1*  --   --   HGB 9.5* 10.2* 10.7*  HCT 30.9* 30.0* 34.5*  MCV 95.1  --  95.0  PLT 562*  --  594*   Cardiac Enzymes:  Recent  Labs Lab 04/12/15 2325  CKTOTAL 106  TROPONINI <0.03   BNP: Invalid input(s): POCBNP CBG:  Recent Labs Lab 04/13/15 1113 04/13/15 1701 04/13/15 2144 04/13/15 2319 04/14/15 0320  GLUCAP 105* 97 99 114* 103*    Recent Results (from the past 240 hour(s))  Blood Culture (routine x 2)     Status: None (Preliminary result)   Collection Time: 04/12/15 11:25 PM  Result Value Ref Range Status   Specimen Description BLOOD LEFT HAND  Final   Special Requests   Final    BOTTLES DRAWN AEROBIC AND ANAEROBIC AEB=5CC ANA=4CC   Culture NO GROWTH < 24 HOURS  Final   Report Status PENDING   Incomplete  Blood Culture (routine x 2)     Status: None (Preliminary result)   Collection Time: 04/12/15 11:50 PM  Result Value Ref Range Status   Specimen Description BLOOD RIGHT FOREARM  Final   Special Requests   Final    BOTTLES DRAWN AEROBIC AND ANAEROBIC AEB=7CC ANA=3CC   Culture NO GROWTH < 24 HOURS  Final   Report Status PENDING  Incomplete  CSF culture     Status: None (Preliminary result)   Collection Time: 04/13/15 12:03 AM  Result Value Ref Range Status   Specimen Description CSF  Final   Special Requests NONE  Final   Gram Stain   Final    WBC PRESENT, PREDOMINANTLY MONONUCLEAR NO ORGANISMS SEEN CYTOSPIN SMEAR Performed at [facility]    Culture PENDING  Incomplete   Report Status PENDING  Incomplete  MRSA PCR Screening     Status: Abnormal   Collection Time: 04/13/15  6:47 AM  Result Value Ref Range Status   MRSA by PCR POSITIVE (A) NEGATIVE Final    Comment:        The GeneXpert MRSA Assay (FDA approved for NASAL specimens only), is one component of a comprehensive MRSA colonization surveillance program. It is not intended to diagnose MRSA infection nor to guide or monitor treatment for MRSA infections. RESULT CALLED TO, READ BACK BY AND VERIFIED WITH: Jenetta Downer RN 10:30 04/13/15 ([email or web address])      Studies:              All Imaging reviewed and is as per above notation   Scheduled Meds: . acyclovir  500 mg Intravenous 3 times per day  . Biotin  5 mg Enteral Daily  . cefTRIAXone (ROCEPHIN)  IV  1 g Intravenous Q24H  . Chlorhexidine Gluconate Cloth  6 each Topical Q0600  . collagenase   Topical Daily  . feeding supplement (PRO-STAT SUGAR FREE 64)  30 mL Oral BID  . fentaNYL  12.5 mcg Transdermal Q72H  . insulin aspart  0-9 Units Subcutaneous 6 times per day  . insulin glargine  5 Units Subcutaneous QHS  . lacosamide (VIMPAT) IV  100 mg Intravenous Q12H  . lacosamide  100 mg Per Tube BID  . levETIRAcetam  500 mg Intravenous Q12H  . levETIRAcetam   500 mg Oral BID  . levothyroxine  137 mcg Per Tube QAC breakfast  . mupirocin ointment  1 application Nasal BID  . oxyCODONE  5 mg Per Tube Q8H  . pantoprazole  40 mg Oral Daily  . senna  1 tablet Oral BID  . sodium chloride  500 mL Intravenous Once  . vitamin C  500 mg Oral BID  . zinc sulfate  220 mg Oral Daily   Continuous Infusions: . sodium chloride 75 mL/hr at 04/13/15 2042  .  dextrose 5 % and 0.9% NaCl       Assessment/Plan:  1. Toxic metabolic encephalopathy secondary to infection/seizure in the setting of chronic pain medication use 2. Seizure with status epilepticus on admission-home medications include Vimpat 100 twice a day, Keppra 500 twice a day, when necessary lorazepam 1 mg every 4 when necessary seizure. Loaded initially with Keppra 1 g, Ativan 2 mg IV and currently on Keppra 500 IV twice a day. EEG performed showed nonspecific right hemispheric dysfunction. CT showed atrophy 3. Sepsis secondary to either aspiration based on chest x-ray findings or meningitis-LP not convincing for bacterial meningitis with no WBCs and glucose and protein levels nonspecific. Await HSV however reasonable to continue acyclovir 1500 daily. Continue ceftriaxone 1 g every 24 hourly. Vancomycin, ampicillin, gentamicin were discontinued on 1/17. Repeat CBC plus differential in a.m. As she has a chronic indwelling Foley catheter, not convinced this is anything more than asymptomatic bacteriuria. Lactic acid 2.2 on admission-->1.5.  We will ask speech therapy to weigh in with regards to recommendations 4. Hypotension-potentially secondary to sepsis versus autonomic dysfunction secondary to multiple sclerosis. Place on D5/saline 100 cc per hour 5. Prior stroke with aphasia, late stage multiple sclerosis and chronically bedbound, PEG tube in situ-difficult situation.  aspirin relatively contraindicated given GI bleed on admission 02/2015. Restart trickle feeds and restart other medications such as  interferon beta 1 a 30 g every 72 days 6. Large sacral decubitus-appreciate wound care consult-continue moist back and sacrum tripped or moisture and center for chemical debridement. When able continue ascorbic acid 500 twice a day, zinc sulfate 220 daily 7. Diabetes mellitus type 2-sliding scale coverage ordered-blood sugars ranging between 103 and 117, continue Lantus 10 units daily at bedtime when taking full by mouth, right now [5 8. Hypothyroidism-continue Synthroid 137 micrograms breakfast time 9. Recent GI bleed-aspirin relatively contraindicated, SCDs, continue Protonix 40 daily 10. Chronic pain-continue fentanyl 12.5 every 3 days, oxycodone 5 mg every 8hr when necessary.    Appt with PCP:  None scheduled Code Status:  DNR Family Communication: discussed with Broad[place] John family + Disposition Plan: SDU DVT prophylaxis: SCD Consultants: Neurology  Pleas Koch, MD  [facility] Pager [phone] 04/14/2015, 7:55 AM    LOS: 1 day

## 2015-04-14 NOTE — Progress Notes (Signed)
40 ml gastric residual noted. Re-fed per order. Increased tube feeding rate to goal of 40ml/hr as per order. Will continue to monitor.

## 2015-04-14 NOTE — Evaluation (Signed)
Clinical/Bedside Swallow Evaluation Patient Details  Name: [name] MRN: [id number] Date of Birth: [birth year]  Today's Date: 04/14/2015 Time: SLP Start Time (ACUTE ONLY): 1434 SLP Stop Time (ACUTE ONLY): 1444 SLP Time Calculation (min) (ACUTE ONLY): 10 min  Past Medical History:  Past Medical History  Diagnosis Date  . Multiple sclerosis (HCC)   . Seizures (HCC)   . Dysphagia   . Dehydration   . Hypertension   . Hemiparesis, left (HCC)   . Pneumonia   . Thrombocytopenia (HCC)   . Aphasia   . Depression   . Hypothyroid   . Esophageal reflux   . Stroke (HCC)   . Lack of coordination   . Contracture of ankle and foot joint   . Obesity   . Muscle weakness   . Dysphagia 12/01/2011  . Chronic pain syndrome 12/01/2011  . GERD (gastroesophageal reflux disease)   . Status post insertion of percutaneous endoscopic gastrostomy (PEG) tube (HCC) 03/01/2015    at [facility]  . 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 Acidosis   . Aphasia    Past Surgical History: History reviewed. No pertinent past surgical history. HPI:  65 yo Female with PMH of multiple sclerosis, seizures, dysphagia, HTN, aphasia, brought to the ED from skilled facility after patient started having seizure which was ongoing for over 30 minutes   Assessment / Plan / Recommendation Clinical Impression  Pt has a high aspiration risk at this time due to poor bolus awareness and oral holding, resulting in the need for oral suction of pureed boluses. With minimal boluses accepted, no pharyngeal swallow response was elicited. It is unclear whether or not pt was taking in any PO at her SNF. Recommend to remain NPO with nutrition and medications via alternative means unless the family should opt for comfort feeding.    Aspiration Risk  Severe aspiration risk    Diet Recommendation NPO   Medication Administration: Via alternative means    Other  Recommendations Oral Care Recommendations: Oral care  QID   Follow up Recommendations   (tba)    Frequency and Duration min 2x/week  1 week       Prognosis Prognosis for Safe Diet Advancement: Guarded Barriers to Reach Goals: Other (Comment) (unclear baseline/overall prognosis)      Swallow Study   General HPI: 65 yo Female with PMH of multiple sclerosis, seizures, dysphagia, HTN, aphasia, brought to the ED from skilled facility after patient started having seizure which was ongoing for over 30 minutes Type of Study: Bedside Swallow Evaluation Previous Swallow Assessment: radiology report found from [facility] in 2011 indicating delayed swallow triggerwith purees, nectar thick liquids, and thin liquids, which resulted in penetration with thin liquids Diet Prior to this Study: NPO;PEG tube Temperature Spikes Noted: No Respiratory Status: Room air History of Recent Intubation: No Behavior/Cognition: Alert;Doesn't follow directions Oral Cavity Assessment: Other (comment) (difficult to assess due to minimal mouth opening) Oral Care Completed by SLP: Yes Oral Cavity - Dentition: Missing dentition Self-Feeding Abilities: Total assist Patient Positioning: Upright in bed Baseline Vocal Quality: Normal Volitional Swallow: Unable to elicit    Oral/Motor/Sensory Function Overall Oral Motor/Sensory Function:  (doesn't follow commands to formally assess)   Ice Chips Ice chips: Impaired Presentation: Spoon Oral Phase Impairments: Poor awareness of bolus Oral Phase Functional Implications: Other (comment) (orally expectorated) Pharyngeal Phase Impairments: Other (comments) (not observed)   Thin Liquid Thin Liquid: Not tested    Nectar Thick Nectar Thick Liquid: Not  tested   Honey Thick Honey Thick Liquid: Not tested   Puree Puree: Impaired Presentation: Spoon Oral Phase Impairments: Poor awareness of bolus Oral Phase Functional Implications: Oral holding   Solid   GO   Solid: Not tested       Maxcine Ham, M.A.  CCC-SLP [phone]  Maxcine Ham 04/14/2015,2:59 PM

## 2015-04-14 NOTE — Progress Notes (Addendum)
Initial Nutrition Assessment  DOCUMENTATION CODES:   Not applicable  INTERVENTION:   Initiate Jevity 1.2 formula at 25 ml/hr and increase by 10 ml every 4 hours to goal rate of 45 ml/hr   Prostat liquid protein 30 ml BID via tube  Total TF regimen to provide 1496 kcals, 90 gm protein, 872 ml of free water  NUTRITION DIAGNOSIS:   Inadequate oral intake related to inability to eat as evidenced by NPO status   GOAL:   Patient will meet greater than or equal to 90% of their needs  MONITOR:   TF tolerance, Labs, Weight trends, Skin, I & O's  REASON FOR ASSESSMENT:   Consult Enteral/tube feeding initiation and management  ASSESSMENT:   65 yo Female with PMH of multiple sclerosis, seizures, dysphagia, HTN;brought to the ED from skilled facility after patient started having seizure which was ongoing for over 30 minutes through which she was given one-time dose of intramuscular Ativan. EMS was called and patient was found to be febrile in status epilepticus and another milligram of Ativan IV was given.  Patient is nonverbal and bed-bound at baseline.  Unable to determine TF regimen via PEG tube at SNF ([facility]).  Pt seen per RD in December 2016 -- had been on Jevity 1.2 formula.  Question whether pt takes anything by mouth; swallow evaluation pending.  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  Pt has vitamin C and zinc ordered to help support wound healing.  RD unable to complete Nutrition Focused Physical Exam at this time.  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  Estimated Nutritional Needs:   Kcal:  1400-1600  Protein:  85-95 gm  Fluid:  >/= 1.5 L  EDUCATION NEEDS:   No education needs identified at this time  Maureen Chatters, RD, LDN Pager #: [phone] After-Hours Pager #: [phone]

## 2015-04-14 NOTE — Clinical Documentation Improvement (Signed)
Internal Medicine  A cause and effect relationship may not be assumed and must be documented by a provider.  Please clarify the relationship, if any, between sepsis and indwelling foley catheter.  Are the conditions:   Due to or associated with each other  Unrelated to each other  Other  Clinically Undetermined   Supporting Information (risk factors, sign and symptoms, diagnostics, treatment): H&P: Patient has indwelling Foley catheter UA showed positive nitrite and too numerous to count WBC. Progress note  04/13/15: Fever/sepsis (elevate LA, leukocytosis) Likely from UTI, patient has indwelling Foley catheter with abnormal UA." Culture pending Component     Latest Ref Rng 04/12/2015  Color, Urine     YELLOW YELLOW  Appearance     CLEAR HAZY (A)  Hgb urine dipstick     NEGATIVE LARGE (A)  Protein     NEGATIVE mg/dL 30 (A)  Nitrite     NEGATIVE POSITIVE (A)  Leukocytes, UA     NEGATIVE LARGE (A)   Please exercise your independent, professional judgment when responding. A specific answer is not anticipated or expected. Please update your documentation within the medical record to reflect your response to this query. Thank you  Thank Barrie Dunker Health Information Management [place] [phone]

## 2015-04-14 NOTE — Consult Note (Signed)
Consultation Note Date: 04/14/2015   Patient Name: [name]  DOB: June 06, 1950  MRN: [id number]  Age / Sex: 65 y.o., female  PCP: Colon Branch, MD Referring Physician: Rhetta Mura, MD  Reason for Consultation: Establishing goals of care and Psychosocial/spiritual support  Clinical Assessment/Narrative:  65 year old female who  has a past medical history of Multiple sclerosis (HCC); Seizures (HCC); Dysphagia; Dehydration; Hypertension; Hemiparesis, left (HCC); Pneumonia; Thrombocytopenia (HCC); Aphasia; Depression; Hypothyroid; Esophageal reflux; Stroke (HCC); Lack of coordination; Contracture of ankle and foot joint; Obesity; Muscle weakness; Dysphagia (12/01/2011); Chronic pain syndrome (12/01/2011); GERD (gastroesophageal reflux disease); Status post insertion of percutaneous endoscopic gastrostomy (PEG) tube (HCC) (03/01/2015); 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  Today was brought to the ED from skilled facility after patient started having seizure which was ongoing for over 30 minutes through which she was given one-time dose of intramuscular Ativan. EMS was called and patient was found to be febrile in status epilepticus and another milligram of Ativan IV was given. Patient is nonverbal at baseline unable to provide any history. In the ED patient again received 2 mg of IV Ativan, 1 g of Keppra IV 1.  As patient was febrile, lumbar puncture was done, and patient empirically started on vancomycin, Rocephin, ampicillin. One dose of Decadron 10 mg IV was given.  Patient has indwelling Foley catheter and PEG tube in place. UA showed positive nitrite and too numerous to count WBC.   This NP Lorinda Creed reviewed medical records, received report from team, assessed the patient and then spoke by telephone  with sister Marua Zigan  to discuss diagnosis prognosis, GOC,  EOL wishes disposition and options.   A detailed discussion was had today regarding advanced directives.  Concepts specific to code status, artifical feeding and hydration, continued IV antibiotics and rehospitalization was had.  The difference between a aggressive medical intervention path  and a palliative comfort care path for this patient at this time was had. We discussed the natural dying process and concept of failure to thrive  Values and goals of care important to patient and family were attempted to be elicited.  Concept of Hospice and Palliative Care were discussed  Natural trajectory and expectations at EOL were discussed.  Questions and concerns addressed.  Hard Choices booklet left for review. Family encouraged to call with questions or concerns.  PMT will continue to support holistically   Primary Decision Maker: [name]  (sister)   SUMMARY OF RECOMMENDATIONS  - treat the treatable -continue use of artifical feeding/hydration -when medically stable transfer back to SNF with Hospice services  -family will need continued support and education as continued decisions related to EOL unfold   Code Status/Advance Care Planning:  DNR      Code Status Orders        Start     Ordered   04/13/15 0355  Do not attempt resuscitation (DNR)   Continuous    Question Answer Comment  In the event of cardiac or respiratory ARREST Do not call a "code blue"   In the event of cardiac or respiratory ARREST Do not perform Intubation, CPR, defibrillation or ACLS   In the event of cardiac or respiratory ARREST Use medication by any route, position, wound care, and other measures to relive pain and suffering. May use oxygen, suction and manual treatment of airway obstruction as needed for comfort.      04/13/15 0355    Code Status History  Date Active Date Inactive Code Status Order ID Comments User Context   03/26/2015  9:53 PM 03/28/2015  5:45 PM DNR [id number]  Levie Heritage, DO Inpatient   03/04/2015  6:38 AM 03/09/2015  5:53 PM DNR [id number]  Houston Siren, MD Inpatient   03/03/2015  2:12 PM 03/04/2015  3:30 AM Full Code [id number]  Elliot Cousin, MD ED   09/08/2011  5:52 AM 09/10/2011  2:16 PM Full Code [id number]  Ian Malkin, LPN ED      Other Directives:None    Palliative Prophylaxis:   Aspiration, Bowel Regimen, Delirium Protocol, Eye Care, Frequent Pain Assessment, Oral Care and Turn Reposition   Psycho-social/Spiritual:  Support System: Fair  Additional Recommendations: Education on Hospice  Prognosis:  Less than 6 months  Discharge Planning: Skilled Nursing Facility with Hospice   Chief Complaint/ Primary Diagnoses: Present on Admission:  . Status epilepticus (HCC) . Hemiparesis, left (HCC) . 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 Hypothyroid  I have reviewed the medical record, interviewed the patient and family, and examined the patient. The following aspects are pertinent.  Past Medical History  Diagnosis Date  . Multiple sclerosis (HCC)   . Seizures (HCC)   . Dysphagia   . Dehydration   . Hypertension   . Hemiparesis, left (HCC)   . Pneumonia   . Thrombocytopenia (HCC)   . Aphasia   . Depression   . Hypothyroid   . Esophageal reflux   . Stroke (HCC)   . Lack of coordination   . Contracture of ankle and foot joint   . Obesity   . Muscle weakness   . Dysphagia 12/01/2011  . Chronic pain syndrome 12/01/2011  . GERD (gastroesophageal reflux disease)   . Status post insertion of percutaneous endoscopic gastrostomy (PEG) tube (HCC) 03/01/2015    at [facility]  . 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 Acidosis   . Aphasia    Social History   Social History  . Marital Status: Single    Spouse Name: N/A  . Number of Children: N/A  . Years of Education: N/A   Social History Main Topics  . Smoking status: Never Smoker   . Smokeless tobacco: None  . Alcohol Use: No  . Drug Use: No  .  Sexual Activity: No   Other Topics Concern  . None   Social History Narrative   No family history on file. Scheduled Meds: . acyclovir  500 mg Intravenous 3 times per day  . cefTRIAXone (ROCEPHIN)  IV  1 g Intravenous Q24H  . Chlorhexidine Gluconate Cloth  6 each Topical Q0600  . collagenase   Topical Daily  . feeding supplement (PRO-STAT SUGAR FREE 64)  30 mL Oral BID  . fentaNYL  12.5 mcg Transdermal Q72H  . insulin aspart  0-9 Units Subcutaneous 6 times per day  . insulin glargine  5 Units Subcutaneous QHS  . lacosamide  100 mg Per Tube BID  . levETIRAcetam  500 mg Per Tube BID  . levothyroxine  137 mcg Per Tube QAC breakfast  . mupirocin ointment  1 application Nasal BID  . oxyCODONE  5 mg Oral Q8H  . pantoprazole sodium  40 mg Per Tube Daily  . sennosides  5 mL Per Tube BID  . sodium chloride  500 mL Intravenous Once  . vitamin C  500 mg Oral BID  . zinc sulfate  220 mg Oral Daily   Continuous Infusions: . sodium  chloride 75 mL/hr at 04/13/15 2042  . dextrose 5 % and 0.9% NaCl     PRN Meds:.acetaminophen **OR** acetaminophen, LORazepam, morphine injection, ondansetron **OR** ondansetron (ZOFRAN) IV Medications Prior to Admission:  Prior to Admission medications   Medication Sig Start Date End Date Taking? Authorizing Provider  albuterol (PROVENTIL) (2.5 MG/3ML) 0.083% nebulizer solution Take 2.5 mg by nebulization every 4 (four) hours as needed for wheezing or shortness of breath.   Yes Historical Provider, MD  Amino Acids-Protein Hydrolys (FEEDING SUPPLEMENT, PRO-STAT SUGAR FREE 64,) LIQD 30 mLs by Enteral route 2 (two) times daily.   Yes Historical Provider, MD  BIOTIN PO 5,000 mcg by Enteral route daily.    Yes Historical Provider, MD  fentaNYL (DURAGESIC - DOSED MCG/HR) 12 MCG/HR Place 12.5 mcg onto the skin every 3 (three) days.   Yes Historical Provider, MD  insulin aspart (NOVOLOG) 100 UNIT/ML injection Sliding-scale NovoLog 2 times a day to 3 times a day: CBG  70-120 no units. CBG 151-200 give 2 units. CBG 201-250 give 3 units. CBG 251-300 give 6 units. CBG 301-350 give 8 units. CBG 351-400 give 10 units. CBG greater than 400 give 12 units and call M.D. Patient taking differently: Inject 2-4 Units into the skin 3 (three) times daily with meals. Sliding-scale NovoLog 2 times a day to 3 times a day: CBG 70-120 no units. CBG 151-200 give 2 units. CBG 201-250 give 3 units. CBG 251-300 give 6 units. CBG 301-350 give 8 units. CBG 351-400 give 10 units. CBG greater than 400 give 12 units and call M.D. 03/09/15  Yes Elliot Cousin, MD  insulin glargine (LANTUS) 100 UNIT/ML injection Inject 0.1 mLs (10 Units total) into the skin at bedtime. 03/09/15  Yes Elliot Cousin, MD  interferon beta-1a (AVONEX) 30 MCG/0.5ML injection Inject 30 mcg into the muscle every 7 (seven) days. On Tuesday   Yes Historical Provider, MD  lacosamide 100 MG TABS Place 1 tablet (100 mg total) into feeding tube 2 (two) times daily. Patient taking differently: 100 mg by Enteral route 2 (two) times daily.  03/09/15  Yes Elliot Cousin, MD  levETIRAcetam (KEPPRA) 500 MG tablet Take 1 tablet (500 mg total) by mouth 2 (two) times daily. Patient taking differently: 500 mg by Enteral route 2 (two) times daily.  03/09/15  Yes Elliot Cousin, MD  levothyroxine (SYNTHROID, LEVOTHROID) 137 MCG tablet 137 mcg by Enteral route daily before breakfast.    Yes Historical Provider, MD  LORazepam (ATIVAN) 2 MG/ML injection Inject 1 mg into the muscle every 4 (four) hours as needed for seizure.   Yes Historical Provider, MD  oxyCODONE (OXY IR/ROXICODONE) 5 MG immediate release tablet Place 1 tablet (5 mg total) into feeding tube every 8 (eight) hours. Patient taking differently: Place 5 mg into feeding tube every 6 (six) hours as needed for moderate pain.  03/09/15  Yes Elliot Cousin, MD  oxyCODONE (OXY IR/ROXICODONE) 5 MG immediate release tablet Take 5 mg by mouth daily as needed for severe pain.   Yes Historical  Provider, MD  pantoprazole (PROTONIX) 40 MG tablet 40 mg by Enteral route daily.   Yes Historical Provider, MD  senna (SENOKOT) 8.6 MG tablet 1 tablet by Enteral route 2 (two) times daily.    Yes Historical Provider, MD  vitamin C (ASCORBIC ACID) 500 MG tablet 500 mg by Enteral route 2 (two) times daily.    Yes Historical Provider, MD  zinc sulfate 220 MG capsule 220 mg by Enteral route daily.   Yes  Historical Provider, MD  acetaminophen (TYLENOL) 650 MG CR tablet 650 mg by Enteral route daily as needed for pain.    Historical Provider, MD  LORazepam (ATIVAN) 2 MG/ML concentrated solution Place 0.5 mLs (1 mg total) into feeding tube every 4 (four) hours as needed for seizure. Sign or symptoms of seizure activity 03/09/15   Elliot Cousin, MD   No Known Allergies  Review of Systems  Unable to perform ROS   Physical Exam  Constitutional: She appears lethargic. She appears cachectic. She appears ill.  HENT:  Mouth/Throat: Oropharynx is clear and moist.  Cardiovascular: Normal rate, regular rhythm and normal heart sounds.   Respiratory: She has decreased breath sounds in the right lower field and the left lower field.  Musculoskeletal:       Right shoulder: She exhibits decreased range of motion, deformity and decreased strength.  Neurological: She appears lethargic. She displays atrophy. She exhibits abnormal muscle tone.  Non verbal  Skin: Skin is warm and dry.  Noted skin breaks per EMR    Vital Signs: BP 88/56 mmHg  Pulse 113  Temp(Src) 97.5 F (36.4 C) (Axillary)  Resp 23  Ht  (1.549 m)  SpO2 99%  SpO2: SpO2: 99 % O2 Device:SpO2: 99 % O2 Flow Rate: .O2 Flow Rate (L/min): 2 L/min  IO: Intake/output summary:  Intake/Output Summary (Last 24 hours) at 04/14/15 1036 Last data filed at 04/14/15 0800  Gross per 24 hour  Intake 2818.75 ml  Output   1000 ml  Net 1818.75 ml    LBM: Last BM Date: 04/13/15 Baseline Weight:   Most recent weight:        Palliative  Assessment/Data:    Additional Data Reviewed:  CBC:    Component Value Date/Time   WBC 12.3* 04/13/2015 0527   HGB 10.7* 04/13/2015 0527   HCT 34.5* 04/13/2015 0527   PLT 594* 04/13/2015 0527   MCV 95.0 04/13/2015 0527   NEUTROABS 8.1* 04/12/2015 2325   LYMPHSABS 2.2 04/12/2015 2325   MONOABS 1.2* 04/12/2015 2325   EOSABS 0.4 04/12/2015 2325   BASOSABS 0.1 04/12/2015 2325   Comprehensive Metabolic Panel:    Component Value Date/Time   NA 143 04/13/2015 0527   K 3.9 04/13/2015 0527   CL 106 04/13/2015 0527   CO2 26 04/13/2015 0527   BUN 10 04/13/2015 0527   CREATININE 0.53 04/13/2015 0527   GLUCOSE 96 04/13/2015 0527   CALCIUM 8.9 04/13/2015 0527   AST 59* 04/12/2015 2325   ALT 34 04/12/2015 2325   ALKPHOS 201* 04/12/2015 2325   BILITOT 0.4 04/12/2015 2325   PROT 6.9 04/12/2015 2325   ALBUMIN 1.6* 04/12/2015 2325   Discussed with Dr Mahala Menghini  Time In: 1130 Time Out: 1230 Time Total: 60 min Greater than 50%  of this time was spent counseling and coordinating care related to the above assessment and plan.  Signed by: Lorinda Creed, NP  Canary Brim, NP  04/14/2015, 10:36 AM  Please contact Palliative Medicine Team phone at [phone] for questions and concerns.

## 2015-04-14 NOTE — Care Management Note (Signed)
Case Management Note  Patient Details  Name: LEIGHANA NEYMAN MRN: [id number] Date of Birth: 24-Dec-1950  Subjective/Objective:    Patient is from [facility], CSW has been in contact with family per CSW note.                 Action/Plan:   Expected Discharge Date:                  Expected Discharge Plan:  Skilled Nursing Facility  In-House Referral:  Clinical Social Work  Discharge planning Services  CM Consult  Post Acute Care Choice:    Choice offered to:     DME Arranged:    DME Agency:     HH Arranged:    HH Agency:     Status of Service:  Completed, signed off  Medicare Important Message Given:    Date Medicare IM Given:    Medicare IM give by:    Date Additional Medicare IM Given:    Additional Medicare Important Message give by:     If discussed at Long Length of Stay Meetings, dates discussed:    Additional Comments:  Leone Haven, RN 04/14/2015, 3:27 PM

## 2015-04-14 NOTE — Clinical Social Work Note (Signed)
Clinical Social Work Assessment  Patient Details  Name: [name] MRN: [id number] Date of Birth: [birth year]  Date of referral:  04/13/15               Reason for consult:  Facility Placement (Patient from [facility] [place])                Permission sought to share information with:  Family Supports Permission granted to share information::  Yes, Verbal Permission Granted (CSW contacted family as patient nonverbal and spoke with sister [name] and niece [name] .)  Name::     Tobin Chad or [place] (niece)  Agency::     Relationship::  Sister and niece  Solicitor Information:  [name] -- [phone] and [name] - [phone]  Housing/Transportation Living arrangements for the past 2 months:  Skilled Nursing Facility ([facility]) Source of Information:  Other (Comment Required) (Family) Patient Interpreter Needed:  None Criminal Activity/Legal Involvement Pertinent to Current Situation/Hospitalization:  No - Comment as needed Significant Relationships:  Other Family Members, Siblings Lives with:  Facility Resident Do you feel safe going back to the place where you live?  No Need for family participation in patient care:  Yes (Comment)  Care giving concerns:  None expressed by patient's family, [name] and sister [name].   Social Worker assessment / plan:  CSW talked by phone briefly with sister [name] and then niece [name] regarding patient's discharge plan and to confirm return to [facility] [place]. [name] requested that sister [name] [phone]) or herself [phone]) be contacted as her mother [name] is ill.  [name] confirmed that patient will return to [facility] when medically stable.  Employment status:  Disabled (Comment on whether or not currently receiving Disability) Insurance information:  Medicare, [place] PT Recommendations:  Not assessed at this time (as of 04/14/15) Information / Referral to community resources:  Skilled Nursing Facility (Patient from  [facility] [place])  Patient/Family's Response to care:  No concerns expressed regarding care during hospitalization.  Patient/Family's Understanding of and Emotional Response to Diagnosis, Current Treatment, and Prognosis:  Not discussed.  Emotional Assessment Appearance:  Other (Comment Required (Did not observe patient, talked with family by phone) Attitude/Demeanor/Rapport:  Unable to Assess Affect (typically observed):  Unable to Assess Orientation:  Oriented to Self, Oriented to Place Alcohol / Substance use:   (Patient reports that she does not smoke, drink or use illicit drugs.) Psych involvement (Current and /or in the community):  No (Comment)  Discharge Needs  Concerns to be addressed:  Discharge Planning Concerns Readmission within the last 30 days:  Yes Current discharge risk:  None Barriers to Discharge:  No Barriers Identified   Cristobal Goldmann, LCSW 04/14/2015, 1:45 PM

## 2015-04-14 NOTE — Progress Notes (Signed)
Pt arrived to unit without incident. Report received. VS obtained. Will continue to monitor. Gara Kroner, RN

## 2015-04-14 NOTE — Clinical Documentation Improvement (Signed)
Internal Medicine  Can the diagnosis of bedbound be further specified?   Function Quadriplegia, including suspected or known cause and/or associated condition(s)  Functional Quadriparesis, including suspected or known cause and/or associated condition(s)   Other  Clinically Undetermined  Document any associated diagnoses/conditions.   Supporting Information: ED notes per Dr. Miller:"chronically debilitated with a history of multiple sclerosis, seizure disorder, dehydration, left hemiparesis, thrombocytopenia, stroke, she is bedbound, she has a large sacral decubitus ulcer.Contracture of ankle and foot joint: Contracture of ankle and foot joint;indwelling Foley catheter and PEG tube in place   Please exercise your independent, professional judgment when responding. A specific answer is not anticipated or expected. Please update your documentation within the medical record to reflect your response to this query. Thank you  Thank Barrie Dunker Health Information Management [place] [phone]

## 2015-04-14 NOTE — Progress Notes (Signed)
NEURO HOSPITALIST PROGRESS NOTE   SUBJECTIVE:                                                                                                                        Awake. No further seizures noted. EEG without electrographic seizures. On Keppra 500 twice a day via ube, Lacosamide 100 milligram tablet twice a day via tube. Labs today: pending.  OBJECTIVE:                                                                                                                           Vital signs in last 24 hours: Temp:  [97.3 F (36.3 C)-98.3 F (36.8 C)] 97.5 F (36.4 C) (01/18 0757) Pulse Rate:  [93-117] 113 (01/18 0745) Resp:  [15-27] 23 (01/18 0745) BP: (66-135)/(33-92) 88/56 mmHg (01/18 0745) SpO2:  [99 %-100 %] 99 % (01/18 0745)  Intake/Output from previous day: 01/17 0701 - 01/18 0700 In: 2768.8 [I.V.:1518.8; IV Piggyback:1160] Out: 1900 [Urine:1900] Intake/Output this shift: Total I/O In: 50 [Other:50] Out: -  Nutritional status: Diet NPO time specified  Past Medical History  Diagnosis Date  . Multiple sclerosis (HCC)   . Seizures (HCC)   . Dysphagia   . Dehydration   . Hypertension   . Hemiparesis, left (HCC)   . Pneumonia   . Thrombocytopenia (HCC)   . Aphasia   . Depression   . Hypothyroid   . Esophageal reflux   . Stroke (HCC)   . Lack of coordination   . Contracture of ankle and foot joint   . Obesity   . Muscle weakness   . Dysphagia 12/01/2011  . Chronic pain syndrome 12/01/2011  . GERD (gastroesophageal reflux disease)   . Status post insertion of percutaneous endoscopic gastrostomy (PEG) tube (HCC) 03/01/2015    at [facility]  . 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 Acidosis   . Aphasia    Physical exam:  Constitutional: chronically disabled female who is poorly responsive to painful stimuli. Eyes: no jaundice or exophthalmos.  Head: normocephalic. Neck: supple, no bruits, no  JVD. Cardiac: no murmurs. Lungs: clear. Abdomen: soft, no tender, no mass. Extremities: bilateral LE edema, no clubbing, or cyanosis.  Skin: no rash  Neurologic Exam:  General: NAD Mental Status: Awake but non verbal Cranial Nerves: II: Discs were not assessed; Visual fields are problematic to assess at this time, pupils equal, round, reactive to light III,IV, VI: ptosis not present, extra-ocular motions intact bilaterally V,VII: smile symmetric, facial light touch sensation unable to assess VIII: hearing normal bilaterally IX,X: unable to assess due to mental status XI: bilateral shoulder shrug no tested XII: unable to assess Motor: Minimal motor movements upper and lower extremities Generalized spasticity Sensory: no tested Deep Tendon Reflexes:  2 all over Plantars: Right: no testedLeft: no tested Cerebellar: Unable to test due to mental status Gait:  Unable to test due to mental status    Lab Results: No results found for: CHOL Lipid Panel No results for input(s): CHOL, TRIG, HDL, CHOLHDL, VLDL, LDLCALC in the last 72 hours.  Studies/Results: Ct Head Wo Contrast  04/13/2015  CLINICAL DATA:  65 year old female with multiple sclerosis presenting with seizures EXAM: CT HEAD WITHOUT CONTRAST TECHNIQUE: Contiguous axial images were obtained from the base of the skull through the vertex without intravenous contrast. COMPARISON:  CT dated 11/30/2011 FINDINGS: There is advanced central and global volume loss similar to prior study. Periventricular and deep white matter hypodensities represent chronic microvascular ischemic changes or related to underlying white matter demyelinating disease. There is no intracranial hemorrhage. No mass effect or midline shift identified. The visualized paranasal sinuses and mastoid air cells are well aerated. The calvarium is intact. IMPRESSION: No acute intracranial hemorrhage. Severe atrophy and chronic  white matter disease. Electronically Signed   By: Elgie Collard M.D.   On: 04/13/2015 05:25   Dg Chest Port 1 View  04/13/2015  CLINICAL DATA:  Acute onset of seizure. Fever. Code sepsis. Initial encounter. EXAM: PORTABLE CHEST 1 VIEW COMPARISON:  Chest radiograph from 03/06/2015 FINDINGS: Vascular congestion is noted. Small bilateral pleural effusions are seen. Increased interstitial markings may reflect mild interstitial edema or pneumonia. No pneumothorax is identified. The cardiomediastinal silhouette is normal in size. No acute osseous abnormalities are seen. IMPRESSION: Vascular congestion noted. Small bilateral pleural effusions seen. Increased interstitial markings may reflect mild interstitial edema or pneumonia. Electronically Signed   By: Roanna Raider M.D.   On: 04/13/2015 00:05    MEDICATIONS                                                                                                                        Scheduled: . acyclovir  500 mg Intravenous 3 times per day  . cefTRIAXone (ROCEPHIN)  IV  1 g Intravenous Q24H  . Chlorhexidine Gluconate Cloth  6 each Topical Q0600  . collagenase   Topical Daily  . feeding supplement (PRO-STAT SUGAR FREE 64)  30 mL Oral BID  . fentaNYL  12.5 mcg Transdermal Q72H  . insulin aspart  0-9 Units Subcutaneous 6 times per day  . insulin glargine  5 Units Subcutaneous QHS  . lacosamide  100 mg Per Tube BID  . levETIRAcetam  500  mg Per Tube BID  . levothyroxine  137 mcg Per Tube QAC breakfast  . mupirocin ointment  1 application Nasal BID  . oxyCODONE  5 mg Oral Q8H  . pantoprazole sodium  40 mg Per Tube Daily  . sennosides  5 mL Per Tube BID  . sodium chloride  500 mL Intravenous Once  . vitamin C  500 mg Oral BID  . zinc sulfate  220 mg Oral Daily    ASSESSMENT/PLAN:                                                                                                            Chronically ill 65 y/o with late stage MS, stroke, seizure  disorder, presented to [facility] with GTC-SE and fever but with unimpressive CSF. She is more awake and interactive today. EEG without electrographic seizures I surmise that acute medical illness (UTI,  CXR shows pulmonary edema versus pneumonia) probably played a role on her SE. Continue current AED regimen. No further inpatient neuro intervention seems to be needed at this time. Will sign off.  Wyatt Portela, MD [facility] [phone]  04/14/2015, 10:17 AM

## 2015-04-14 NOTE — NC FL2 (Signed)
[place] MEDICAID FL2 LEVEL OF CARE SCREENING TOOL     IDENTIFICATION  Patient Name: [name] Birthdate: 1950-08-25 Sex: female Admission Date (Current Location): 04/12/2015  [place] and [place] Number:  [name] [id number] Facility and Address:  [facility], [address] [place], [place] [address]      Provider Number: [id number]  Attending Physician Name and Address:  Rhetta Mura, MD  Relative Name and Phone Number:  Deira Shimer. Phone number [phone]    Current Level of Care: Hospital Recommended Level of Care: Skilled Nursing Facility Prior Approval Number:    Date Approved/Denied:   PASRR Number: [id number] (Eff. 03/27/15)  Discharge Plan: SNF ([facility])    Current Diagnoses: Patient Active Problem List   Diagnosis Date Noted  . DNR (do not resuscitate) 04/14/2015  . Status epilepticus (HCC) 04/13/2015  . Seizure (HCC) 04/13/2015  . UTI (lower urinary tract infection) 04/13/2015  . Hyperkalemia 03/26/2015  . Dehydration with hyponatremia 03/26/2015  . Acute blood loss anemia 03/26/2015  . Palliative care encounter   . UGI bleed 03/04/2015  . HCAP (healthcare-associated pneumonia) 03/03/2015  . Seizure disorder (HCC) 03/03/2015  . Status post insertion of percutaneous endoscopic gastrostomy (PEG) tube (HCC) 03/03/2015  . AKI (acute kidney injury) (HCC) 03/03/2015  . Hypotension (arterial) 03/03/2015  . Hyperglycemia 03/03/2015  . 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 Bradycardia 12/01/2011  . Dysphagia 12/01/2011  . Chronic pain syndrome 12/01/2011  . Lethargy 11/30/2011  . Decreased responsiveness 11/30/2011  . Thrombocytopenia (HCC) 09/08/2011  . Multiple sclerosis (HCC)   . Seizures (HCC)   . Hemiparesis, left (HCC)   . Hypertension   . Hypothyroid   . Depression   . Aphasia   . Obesity   . Muscle weakness     Orientation RESPIRATION BLADDER Height &  Weight    Self, Place  Normal Continent (Patient came to hospital with urethral catheter, but was removed 1/17)   135 lbs.  BEHAVIORAL SYMPTOMS/MOOD NEUROLOGICAL BOWEL NUTRITION STATUS    Convulsions/Seizures Continent Feeding tube (Jevity - 1.2 formula at 25 ml/hr and increase by 10 ml every 4 hours to goal rate of 45 ml/hr)  AMBULATORY STATUS COMMUNICATION OF NEEDS Skin   Total Care Non-Verbally (Very limited if any communication by patient.) Other (Comment) (Stage 4 pressure ulcer to sacrum and unstageable pressure ulcer to right ischium)                       Personal Care Assistance Level of Assistance  Total care       Total Care Assistance: Maximum assistance   Functional Limitations Info  Sight, Hearing, Speech Sight Info: Adequate Hearing Info: Adequate Speech Info: Impaired    SPECIAL CARE FACTORS FREQUENCY                       Contractures Contractures Info: Present (Rhythmic contractures)    Additional Factors Info  Insulin Sliding Scale, Code Status, Allergies Code Status Info: DNR Allergies Info: No known allergies   Insulin Sliding Scale Info: Novolog 0-9 units 6 times per day in hospital       Current Medications (04/14/2015):  This is the current hospital active medication list Current Facility-Administered Medications  Medication Dose Route Frequency Provider Last Rate Last Dose  . 0.9 %  sodium chloride infusion   Intravenous Continuous Meredeth Ide, MD 75 mL/hr at 04/13/15 2042    .  acetaminophen (TYLENOL) tablet 650 mg  650 mg Oral Q6H PRN Meredeth Ide, MD       Or  . acetaminophen (TYLENOL) suppository 650 mg  650 mg Rectal Q6H PRN Meredeth Ide, MD      . acyclovir (ZOVIRAX) 500 mg in dextrose 5 % 100 mL IVPB  500 mg Intravenous 3 times per day Joseph Art, DO   500 mg at 04/14/15 0555  . cefTRIAXone (ROCEPHIN) 1 g in dextrose 5 % 50 mL IVPB  1 g Intravenous Q24H Joseph Art, DO   1 g at 04/13/15 2052  . Chlorhexidine Gluconate  Cloth 2 % PADS 6 each  6 each Topical Q0600 Joseph Art, DO   6 each at 04/14/15 0600  . collagenase (SANTYL) ointment   Topical Daily Jessica U Vann, DO      . dextrose 5 %-0.9 % sodium chloride infusion   Intravenous Continuous Rhetta Mura, MD 100 mL/hr at 04/14/15 1058    . feeding supplement (JEVITY 1.2 CAL) liquid 1,000 mL  1,000 mL Per Tube Continuous Ailene Ards, RD      . feeding supplement (PRO-STAT SUGAR FREE 64) liquid 30 mL  30 mL Per Tube BID Ailene Ards, RD      . fentaNYL (DURAGESIC - dosed mcg/hr) 12.5 mcg  12.5 mcg Transdermal Q72H Rhetta Mura, MD   12.5 mcg at 04/14/15 1057  . insulin aspart (novoLOG) injection 0-9 Units  0-9 Units Subcutaneous 6 times per day Joseph Art, DO   0 Units at 04/13/15 1200  . insulin glargine (LANTUS) injection 5 Units  5 Units Subcutaneous QHS Rhetta Mura, MD      . lacosamide (VIMPAT) tablet 100 mg  100 mg Per Tube BID Rhetta Mura, MD   100 mg at 04/14/15 1057  . levETIRAcetam (KEPPRA) 100 MG/ML solution 500 mg  500 mg Per Tube BID Rhetta Mura, MD   500 mg at 04/14/15 1057  . levothyroxine (SYNTHROID, LEVOTHROID) tablet 137 mcg  137 mcg Per Tube QAC breakfast Meredeth Ide, MD   137 mcg at 04/14/15 0751  . LORazepam (ATIVAN) injection 2 mg  2 mg Intravenous Q4H PRN Meredeth Ide, MD      . morphine 2 MG/ML injection 1 mg  1 mg Intravenous Q3H PRN Rhetta Mura, MD   1 mg at 04/14/15 1157  . mupirocin ointment (BACTROBAN) 2 % 1 application  1 application Nasal BID Joseph Art, DO   1 application at 04/14/15 1058  . ondansetron (ZOFRAN) tablet 4 mg  4 mg Oral Q6H PRN Meredeth Ide, MD       Or  . ondansetron (ZOFRAN) injection 4 mg  4 mg Intravenous Q6H PRN Meredeth Ide, MD      . oxyCODONE (ROXICODONE) 5 MG/5ML solution 5 mg  5 mg Oral Q8H Rhetta Mura, MD   5 mg at 04/14/15 1057  . pantoprazole sodium (PROTONIX) 40 mg/20 mL oral suspension 40 mg  40 mg Per Tube Daily  Rhetta Mura, MD   40 mg at 04/14/15 1057  . sennosides (SENOKOT) 8.8 MG/5ML syrup 5 mL  5 mL Per Tube BID Rhetta Mura, MD   5 mL at 04/14/15 1057  . sodium chloride 0.9 % bolus 500 mL  500 mL Intravenous Once Rhetta Mura, MD   500 mL at 04/14/15 0734  . vitamin C (ASCORBIC ACID) tablet 500 mg  500 mg Oral BID Rhetta Mura, MD  500 mg at 04/14/15 1057  . zinc sulfate capsule 220 mg  220 mg Oral Daily Rhetta Mura, MD   220 mg at 04/14/15 1057     Discharge Medications: Please see discharge summary for a list of discharge medications.  Relevant Imaging Results:  Relevant Lab Results:   Additional Information ss#[id number].  Cristobal Goldmann, LCSW

## 2015-04-15 ENCOUNTER — Inpatient Hospital Stay (HOSPITAL_COMMUNITY): Payer: Medicare Other

## 2015-04-15 DIAGNOSIS — E031 Congenital hypothyroidism without goiter: Secondary | ICD-10-CM

## 2015-04-15 LAB — CBC WITH DIFFERENTIAL/PLATELET
Basophils Absolute: 0 10*3/uL (ref 0.0–0.1)
Basophils Relative: 0 %
EOS PCT: 2 %
Eosinophils Absolute: 0.3 10*3/uL (ref 0.0–0.7)
HCT: 28.4 % — ABNORMAL LOW (ref 36.0–46.0)
HEMOGLOBIN: 8.6 g/dL — AB (ref 12.0–15.0)
LYMPHS ABS: 1.5 10*3/uL (ref 0.7–4.0)
LYMPHS PCT: 12 %
MCH: 29.2 pg (ref 26.0–34.0)
MCHC: 30.3 g/dL (ref 30.0–36.0)
MCV: 96.3 fL (ref 78.0–100.0)
MONOS PCT: 11 %
Monocytes Absolute: 1.4 10*3/uL — ABNORMAL HIGH (ref 0.1–1.0)
Neutro Abs: 9.3 10*3/uL — ABNORMAL HIGH (ref 1.7–7.7)
Neutrophils Relative %: 75 %
PLATELETS: 465 10*3/uL — AB (ref 150–400)
RBC: 2.95 MIL/uL — AB (ref 3.87–5.11)
RDW: 18.6 % — ABNORMAL HIGH (ref 11.5–15.5)
WBC: 12.5 10*3/uL — AB (ref 4.0–10.5)

## 2015-04-15 LAB — COMPREHENSIVE METABOLIC PANEL
ALK PHOS: 155 U/L — AB (ref 38–126)
ALT: 22 U/L (ref 14–54)
ANION GAP: 9 (ref 5–15)
AST: 49 U/L — ABNORMAL HIGH (ref 15–41)
Albumin: 1.4 g/dL — ABNORMAL LOW (ref 3.5–5.0)
BUN: 19 mg/dL (ref 6–20)
CALCIUM: 8.2 mg/dL — AB (ref 8.9–10.3)
CO2: 19 mmol/L — ABNORMAL LOW (ref 22–32)
CREATININE: 0.67 mg/dL (ref 0.44–1.00)
Chloride: 118 mmol/L — ABNORMAL HIGH (ref 101–111)
Glucose, Bld: 116 mg/dL — ABNORMAL HIGH (ref 65–99)
Potassium: 4.4 mmol/L (ref 3.5–5.1)
Sodium: 146 mmol/L — ABNORMAL HIGH (ref 135–145)
TOTAL PROTEIN: 5.9 g/dL — AB (ref 6.5–8.1)
Total Bilirubin: 1 mg/dL (ref 0.3–1.2)

## 2015-04-15 LAB — PROTIME-INR
INR: 1.26 (ref 0.00–1.49)
PROTHROMBIN TIME: 16 s — AB (ref 11.6–15.2)

## 2015-04-15 LAB — GLUCOSE, CAPILLARY
GLUCOSE-CAPILLARY: 120 mg/dL — AB (ref 65–99)
GLUCOSE-CAPILLARY: 112 mg/dL — AB (ref 65–99)
Glucose-Capillary: 107 mg/dL — ABNORMAL HIGH (ref 65–99)
Glucose-Capillary: 104 mg/dL — ABNORMAL HIGH (ref 65–99)

## 2015-04-15 MED ORDER — AMOXICILLIN-POT CLAVULANATE 875-125 MG PO TABS: 1.0000 | ORAL_TABLET | Freq: Two times a day (BID) | ORAL | Status: DC

## 2015-04-15 MED ORDER — LORAZEPAM 2 MG/ML PO CONC: 1.0000 mg | ORAL | Status: DC | PRN

## 2015-04-15 MED ORDER — AMOXICILLIN-POT CLAVULANATE 875-125 MG PO TABS
1.0000 | ORAL_TABLET | Freq: Two times a day (BID) | ORAL | Status: DC
  Administered 2015-04-15: 1 via ORAL
  Filled 2015-04-15: qty 1

## 2015-04-15 MED ORDER — ACETAMINOPHEN 325 MG PO TABS: 650.0000 mg | ORAL_TABLET | Freq: Four times a day (QID) | ORAL | Status: DC | PRN

## 2015-04-15 MED ORDER — OXYCODONE HCL 5 MG/5ML PO SOLN: 5.0000 mg | Freq: Three times a day (TID) | ORAL | Status: DC

## 2015-04-15 MED ORDER — JEVITY 1.2 CAL PO LIQD: 1000.0000 mL | ORAL | Status: DC

## 2015-04-15 NOTE — Progress Notes (Signed)
SLP Cancellation Note  Patient Details Name: [name] MRN: [id number] DOB: [birth year]   Cancelled treatment:       Reason Eval/Treat Not Completed: Other (comment) (Discharging back to SNF this pm. )  Ferdinand Lango MA, CCC-SLP [phone]  [name] [name] 04/15/2015, 1:42 PM

## 2015-04-15 NOTE — Progress Notes (Signed)
CM called by palliative care and informed that patient could return to [facility] today with hospice care. CM passed the message to Adria Devon the CSW for the patient. Current plan is for patient to discharge back to [facility] with hospice today. No further needs per CM.

## 2015-04-15 NOTE — Clinical Social Work Note (Signed)
Patient has not been evaluated by PT. Patient is a long-term resident and w ill return to SNF, [facility] with hospice care. Facility aware.   Clinical Social Worker facilitated patient discharge including contacting patient family and facility to confirm patient discharge plans.  Clinical information faxed to facility and family agreeable with plan.  CSW arranged ambulance transport via [facility] to [facility].  RN to call report prior to discharge.  Clinical Social Worker will sign off for now as social work intervention is no longer needed. Please consult [place] again if new need arises.  Derenda Fennel, MSW, LCSWA [phone] 04/15/2015 12:42 PM

## 2015-04-15 NOTE — Progress Notes (Signed)
Patient is being discharged to a skilled nursing facility. Report called to the receiving nurse.

## 2015-04-15 NOTE — Progress Notes (Signed)
Patient left SNF via [facility].

## 2015-04-15 NOTE — Discharge Summary (Signed)
Physician Discharge Summary  [name] ZOX:[id number] DOB: Jun 18, 1950 DOA: 04/12/2015  PCP: Colon Branch, MD  Admit date: 04/12/2015 Discharge date: 04/15/2015  Time spent: 30 minutes  Recommendations for Outpatient Follow-up:  1. NPO recommended-Very high aspiration risk despite this and high risk clinical decompnesation 2. Advise Hospice to follow at facility 3. SNF MD to simplify meds and delneat GOC 4. Oral morphine soln and Ativan soln Rx this admit 5. 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 Sz disorder follwed previously Dr. Gerilyn Pilgrim Neurology DM ty II Large Sacral decubitus Htn [place] CVA in the past H/o Severe Multiple sclerosis with Chr Aphasia, Neurogenic bowel, Chr Bed-bound-with a PEG tube in situ H/o Bipolar 1 Hypohtyroidism Chr Pain syndrome on Narcotics  Admitted 03/03/15-Was intubated that admission and found to have a probable Aspiration PNA. She also had an Upper GI bleed at that admission Palliative care discussed with family but comfort care was rescinded as patient survived extubation.  was ER -admitted 03/26/15 with hyperkalemia and contnued discussion c Pallaitive care recomnmended  Re-admitted [facility] with Status epilepticus-Ativan given. Given Ativan 2, Keppra 1. LP was performed and patient started on empiric coverage for Meninigitis, and UA was concerning for asymtp bacteriuria  Her  Urine specimen was colonized and WAS NJOT  A urinary infecitons  It was felt highly unlikely that she had meningitis and all antibiotics for this were d/c  It was felt instead source of fever and potential Seizure event was from Aspiration  Her sacral decubitus was clean and non odourous  She was started on Augmentin for aspiration coverage  Pallliative medicine was consulted re: patient and presentation and family seemed to express understanding of gravity of siutation  She will d/c back today to [facility]   Discharge Exam: Filed Vitals:   04/15/15 0546 04/15/15 0943  BP: 171/97 156/83  Pulse: 133 126  Temp: 99.1 F (37.3 C) 100.1 F (37.8 C)  Resp: 24 24    General: non-verbal.  No reported Sz overngiht Tube feeds at goal Cardiovascular:  s1 s2 no m/r/g Respiratory: clear Decubiti not examined today  Discharge Instructions   Discharge Instructions    Diet - low sodium heart healthy    Complete by:  As directed      Increase activity slowly    Complete by:  As directed           Current Discharge Medication List    START taking these medications   Details  acetaminophen (TYLENOL) 325 MG tablet Take 2 tablets (650 mg total) by mouth every 6 (six) hours as needed for mild pain (or Fever >/= 101).    amoxicillin-clavulanate (AUGMENTIN) 875-125 MG tablet Take 1 tablet by mouth every 12 (twelve) hours. Qty: 10 tablet, Refills: 0    Nutritional Supplements (FEEDING SUPPLEMENT, JEVITY 1.2 CAL,) LIQD Place 1,000 mLs into  feeding tube continuous. Refills: 0    oxyCODONE (ROXICODONE) 5 MG/5ML solution Take 5 mLs (5 mg total) by mouth every 8 (eight) hours. Qty: 473 mL, Refills: 0      CONTINUE these medications which have CHANGED   Details  LORazepam (ATIVAN) 2 MG/ML concentrated solution Place 0.5 mLs (1 mg total) into feeding tube every 4 (four) hours as needed for seizure. Sign or symptoms of seizure activity Qty: 30 mL, Refills: 0      CONTINUE  these medications which have NOT CHANGED   Details  albuterol (PROVENTIL) (2.5 MG/3ML) 0.083% nebulizer solution Take 2.5 mg by nebulization every 4 (four) hours as needed for wheezing or shortness of breath.    Amino Acids-Protein Hydrolys (FEEDING SUPPLEMENT, PRO-STAT SUGAR FREE 64,) LIQD 30 mLs by Enteral route 2 (two) times daily.    BIOTIN PO 5,000 mcg by Enteral route daily.     fentaNYL (DURAGESIC - DOSED MCG/HR) 12 MCG/HR Place 12.5 mcg onto the skin every 3 (three) days.    insulin aspart (NOVOLOG) 100 UNIT/ML injection Sliding-scale NovoLog 2 times a day to 3 times a day: CBG 70-120 no units. CBG 151-200 give 2 units. CBG 201-250 give 3 units. CBG 251-300 give 6 units. CBG 301-350 give 8 units. CBG 351-400 give 10 units. CBG greater than 400 give 12 units and call M.D.    insulin glargine (LANTUS) 100 UNIT/ML injection Inject 0.1 mLs (10 Units total) into the skin at bedtime.    interferon beta-1a (AVONEX) 30 MCG/0.5ML injection Inject 30 mcg into the muscle every 7 (seven) days. On Tuesday    lacosamide 100 MG TABS Place 1 tablet (100 mg total) into feeding tube 2 (two) times daily.    levETIRAcetam (KEPPRA) 500 MG tablet Take 1 tablet (500 mg total) by mouth 2 (two) times daily.    levothyroxine (SYNTHROID, LEVOTHROID) 137 MCG tablet 137 mcg by Enteral route daily before breakfast.     LORazepam (ATIVAN) 2 MG/ML injection Inject 1 mg into the muscle every 4 (four) hours as needed for seizure.    pantoprazole (PROTONIX) 40 MG tablet 40 mg by Enteral route daily.    senna (SENOKOT) 8.6 MG tablet 1 tablet by Enteral route 2 (two) times daily.     vitamin C (ASCORBIC ACID) 500 MG tablet 500 mg by Enteral route 2 (two) times daily.     zinc sulfate 220 MG capsule 220 mg by Enteral route daily.    acetaminophen (TYLENOL) 650 MG CR tablet 650 mg by Enteral route daily as needed for pain.      STOP taking these medications     oxyCODONE (OXY IR/ROXICODONE) 5 MG immediate  release tablet      oxyCODONE (OXY IR/ROXICODONE) 5 MG immediate release tablet        No Known Allergies    The results of significant diagnostics from this hospitalization (including imaging, microbiology, ancillary and laboratory) are listed below for reference.    Significant Diagnostic Studies: Ct Head Wo Contrast  04/13/2015  CLINICAL DATA:  65 year old female with multiple sclerosis presenting with seizures EXAM: CT HEAD WITHOUT CONTRAST TECHNIQUE: Contiguous axial images were obtained from the base of the skull through the vertex without intravenous contrast. COMPARISON:  CT dated 11/30/2011 FINDINGS: There is advanced central and global volume loss similar to prior study. Periventricular and deep white matter hypodensities represent chronic microvascular ischemic changes or related to underlying white matter demyelinating disease. There is no intracranial hemorrhage. No mass effect or midline  shift identified. The visualized paranasal sinuses and mastoid air cells are well aerated. The calvarium is intact. IMPRESSION: No acute intracranial hemorrhage. Severe atrophy and chronic white matter disease. Electronically Signed   By: Elgie Collard M.D.   On: 04/13/2015 05:25   Dg Chest Port 1 View  04/15/2015  CLINICAL DATA:  Pneumonia EXAM: PORTABLE CHEST 1 VIEW COMPARISON:  04/12/2015 FINDINGS: Graded hazy appearance of the bilateral chest consistent with layering pleural effusions and atelectasis. No superimposed air bronchogram to identify reported pneumonia. Normal heart size. Negative aortic and hilar contours. IMPRESSION: Layering pleural effusions and atelectasis. Superimposed pneumonia could be obscured. Electronically Signed   By: Marnee Spring M.D.   On: 04/15/2015 10:36   Dg Chest Port 1 View  04/13/2015  CLINICAL DATA:  Acute onset of seizure. Fever. Code sepsis. Initial encounter. EXAM: PORTABLE CHEST 1 VIEW COMPARISON:  Chest radiograph from 03/06/2015 FINDINGS: Vascular  congestion is noted. Small bilateral pleural effusions are seen. Increased interstitial markings may reflect mild interstitial edema or pneumonia. No pneumothorax is identified. The cardiomediastinal silhouette is normal in size. No acute osseous abnormalities are seen. IMPRESSION: Vascular congestion noted. Small bilateral pleural effusions seen. Increased interstitial markings may reflect mild interstitial edema or pneumonia. Electronically Signed   By: Roanna Raider M.D.   On: 04/13/2015 00:05    Microbiology: Recent Results (from the past 240 hour(s))  Urine culture     Status: None (Preliminary result)   Collection Time: 04/12/15 10:56 PM  Result Value Ref Range Status   Specimen Description URINE, CATHETERIZED  Final   Special Requests NONE  Final   Culture   Final    >=100,000 COLONIES/mL METHICILLIN RESISTANT STAPHYLOCOCCUS AUREUS CULTURE REINCUBATED FOR BETTER GROWTH Performed at [facility]    Report Status PENDING  Incomplete   Organism ID, Bacteria METHICILLIN RESISTANT STAPHYLOCOCCUS AUREUS  Final      Susceptibility   Methicillin resistant staphylococcus aureus - MIC*    CIPROFLOXACIN >=8 RESISTANT Resistant     GENTAMICIN <=0.5 SENSITIVE Sensitive     NITROFURANTOIN <=16 SENSITIVE Sensitive     OXACILLIN >=4 RESISTANT Resistant     TETRACYCLINE <=1 SENSITIVE Sensitive     VANCOMYCIN <=0.5 SENSITIVE Sensitive     TRIMETH/SULFA <=10 SENSITIVE Sensitive     CLINDAMYCIN >=8 RESISTANT Resistant     RIFAMPIN <=0.5 SENSITIVE Sensitive     Inducible Clindamycin NEGATIVE Sensitive     * >=100,000 COLONIES/mL METHICILLIN RESISTANT STAPHYLOCOCCUS AUREUS  Blood Culture (routine x 2)     Status: None (Preliminary result)   Collection Time: 04/12/15 11:25 PM  Result Value Ref Range Status   Specimen Description BLOOD LEFT HAND  Final   Special Requests   Final    BOTTLES DRAWN AEROBIC AND ANAEROBIC AEB=5CC ANA=4CC   Culture NO GROWTH 3 DAYS  Final   Report Status  PENDING  Incomplete  Blood Culture (routine x 2)     Status: None (Preliminary result)   Collection Time: 04/12/15 11:50 PM  Result Value Ref Range Status   Specimen Description BLOOD RIGHT FOREARM  Final   Special Requests   Final    BOTTLES DRAWN AEROBIC AND ANAEROBIC AEB=7CC ANA=3CC   Culture NO GROWTH 3 DAYS  Final   Report Status PENDING  Incomplete  CSF culture     Status: None (Preliminary result)   Collection Time: 04/13/15 12:03 AM  Result Value Ref Range Status   Specimen Description CSF  Final   Special Requests NONE  Final   Gram Stain   Final    WBC PRESENT, PREDOMINANTLY MONONUCLEAR NO ORGANISMS SEEN CYTOSPIN SMEAR    Culture   Final    NO GROWTH 2 DAYS Performed at [facility]    Report Status PENDING  Incomplete  MRSA PCR Screening     Status: Abnormal   Collection Time: 04/13/15  6:47 AM  Result Value Ref Range Status   MRSA by PCR POSITIVE (A) NEGATIVE Final    Comment:        The GeneXpert MRSA Assay (FDA approved for NASAL specimens only), is one component of a comprehensive MRSA colonization surveillance program. It is not intended to diagnose MRSA infection nor to guide or monitor treatment for MRSA infections. RESULT CALLED TO, READ BACK BY AND VERIFIED WITH: Jenetta Downer RN 10:30 04/13/15 ([email or web address])      Labs: Basic Metabolic Panel:  Recent Labs Lab 04/12/15 2325 04/12/15 2359 04/13/15 0527 04/15/15 0830  NA 135 135 143 146*  K 3.7 4.8 3.9 4.4  CL 99* 98* 106 118*  CO2 28  --  26 19*  GLUCOSE 98 101* 96 116*  BUN CREATININE 0.52 0.40* 0.53 0.67  CALCIUM 8.6*  --  8.9 8.2*   Liver Function Tests:  Recent Labs Lab 04/12/15 2325 04/15/15 0830  AST 59* 49*  ALT 34 22  ALKPHOS 201* 155*  BILITOT 0.4 1.0  PROT 6.9 5.9*  ALBUMIN 1.6* 1.4*   No results for input(s): LIPASE, AMYLASE in the last 168 hours. No results for input(s): AMMONIA in the last 168 hours. CBC:  Recent Labs Lab 04/12/15 2325  04/12/15 2359 04/13/15 0527 04/15/15 0830  WBC 12.0*  --  12.3* 12.5*  NEUTROABS 8.1*  --   --  9.3*  HGB 9.5* 10.2* 10.7* 8.6*  HCT 30.9* 30.0* 34.5* 28.4*  MCV 95.1  --  95.0 96.3  PLT 562*  --  594* 465*   Cardiac Enzymes:  Recent Labs Lab 04/12/15 2325  CKTOTAL 106  TROPONINI <0.03   BNP: BNP (last 3 results) No results for input(s): BNP in the last 8760 hours.  ProBNP (last 3 results) No results for input(s): PROBNP in the last 8760 hours.  CBG:  Recent Labs Lab 04/14/15 1942 04/14/15 2254 04/15/15 0436 04/15/15 0748 04/15/15 1200  GLUCAP 107* 107* 104* 120* 112*       Signed:  Rhetta Mura MD   [facility] 04/15/2015, 12:12 PM

## 2015-04-15 NOTE — Progress Notes (Signed)
73ml gastric residual noted. Will continue to monitor.

## 2015-04-16 LAB — CSF CULTURE W GRAM STAIN: Culture: NO GROWTH

## 2015-04-16 LAB — CSF CULTURE

## 2015-04-17 LAB — URINE CULTURE

## 2015-04-17 LAB — CULTURE, BLOOD (ROUTINE X 2)
Culture: NO GROWTH
Culture: NO GROWTH

## 2015-05-25 ENCOUNTER — Other Ambulatory Visit: Payer: Self-pay

## 2015-05-25 ENCOUNTER — Ambulatory Visit (HOSPITAL_COMMUNITY): Admit: 2015-05-25 | Payer: Self-pay | Admitting: Gastroenterology

## 2015-05-25 ENCOUNTER — Encounter (HOSPITAL_COMMUNITY): Payer: Self-pay

## 2015-05-25 ENCOUNTER — Encounter (HOSPITAL_COMMUNITY): Payer: Self-pay | Admitting: *Deleted

## 2015-05-25 ENCOUNTER — Telehealth: Payer: Self-pay

## 2015-05-25 ENCOUNTER — Encounter (HOSPITAL_COMMUNITY)
Admission: RE | Disposition: A | Discharge status: Skilled Nursing Facility | Payer: Self-pay | Source: Ambulatory Visit | Attending: Gastroenterology

## 2015-05-25 ENCOUNTER — Ambulatory Visit (HOSPITAL_COMMUNITY)
Admission: RE | Admit: 2015-05-25 | Discharge: 2015-05-25 | Disposition: A | Discharge status: Skilled Nursing Facility | Payer: Medicare Other | Source: Ambulatory Visit | Attending: Gastroenterology | Admitting: Gastroenterology

## 2015-05-25 DIAGNOSIS — F329 Major depressive disorder, single episode, unspecified: Secondary | ICD-10-CM | POA: Insufficient documentation

## 2015-05-25 DIAGNOSIS — Z79899 Other long term (current) drug therapy: Secondary | ICD-10-CM | POA: Diagnosis not present

## 2015-05-25 DIAGNOSIS — Z431 Encounter for attention to gastrostomy: Secondary | ICD-10-CM

## 2015-05-25 DIAGNOSIS — E039 Hypothyroidism, unspecified: Secondary | ICD-10-CM | POA: Diagnosis not present

## 2015-05-25 DIAGNOSIS — Z794 Long term (current) use of insulin: Secondary | ICD-10-CM | POA: Insufficient documentation

## 2015-05-25 DIAGNOSIS — I69354 Hemiplegia and hemiparesis following cerebral infarction affecting left non-dominant side: Secondary | ICD-10-CM | POA: Insufficient documentation

## 2015-05-25 DIAGNOSIS — R1310 Dysphagia, unspecified: Secondary | ICD-10-CM | POA: Insufficient documentation

## 2015-05-25 DIAGNOSIS — I10 Essential (primary) hypertension: Secondary | ICD-10-CM | POA: Insufficient documentation

## 2015-05-25 DIAGNOSIS — K2970 Gastritis, unspecified, without bleeding: Secondary | ICD-10-CM | POA: Diagnosis not present

## 2015-05-25 DIAGNOSIS — K219 Gastro-esophageal reflux disease without esophagitis: Secondary | ICD-10-CM | POA: Insufficient documentation

## 2015-05-25 HISTORY — PX: PEG PLACEMENT: SHX5437

## 2015-05-25 HISTORY — PX: ESOPHAGOGASTRODUODENOSCOPY: SHX5428

## 2015-05-25 SURGERY — EGD (ESOPHAGOGASTRODUODENOSCOPY)
Anesthesia: Moderate Sedation

## 2015-05-25 SURGERY — REPLACEMENT, PEG TUBE, WITHOUT ENDOSCOPY
Anesthesia: LOCAL

## 2015-05-25 MED ORDER — CEFAZOLIN SODIUM-DEXTROSE 2-3 GM-% IV SOLR: 2.0000 g | Freq: Once | INTRAVENOUS | Status: DC

## 2015-05-25 MED ORDER — CEFAZOLIN SODIUM-DEXTROSE 2-3 GM-% IV SOLR
INTRAVENOUS | Status: AC
  Filled 2015-05-25: qty 50

## 2015-05-25 MED ORDER — MEPERIDINE HCL 100 MG/ML IJ SOLN
INTRAMUSCULAR | Status: DC | PRN
  Administered 2015-05-25: 25 mg

## 2015-05-25 MED ORDER — LIDOCAINE VISCOUS 2 % MT SOLN
OROMUCOSAL | Status: DC | PRN
  Administered 2015-05-25: 1 via OROMUCOSAL

## 2015-05-25 MED ORDER — SODIUM CHLORIDE 0.9 % IV SOLN
INTRAVENOUS | Status: DC
  Administered 2015-05-25: 1000 mL via INTRAVENOUS

## 2015-05-25 MED ORDER — LIDOCAINE VISCOUS 2 % MT SOLN
OROMUCOSAL | Status: AC
  Filled 2015-05-25: qty 15

## 2015-05-25 MED ORDER — MIDAZOLAM HCL 5 MG/5ML IJ SOLN
INTRAMUSCULAR | Status: DC | PRN
  Administered 2015-05-25: 2 mg via INTRAVENOUS

## 2015-05-25 MED ORDER — MEPERIDINE HCL 100 MG/ML IJ SOLN
INTRAMUSCULAR | Status: AC
  Filled 2015-05-25: qty 1

## 2015-05-25 MED ORDER — MIDAZOLAM HCL 5 MG/5ML IJ SOLN
INTRAMUSCULAR | Status: AC
  Filled 2015-05-25: qty 5

## 2015-05-25 NOTE — Telephone Encounter (Signed)
Evelyn from [facility] called and states that patients Peg tube has been out since last night and that the patient can't get her medication.  Wants to know if patient can have peg replaced today.  Please advise

## 2015-05-25 NOTE — Telephone Encounter (Signed)
Spoke with Dr. Darrick Penna. She is requesting a medication list from [facility].  Called [facility] and Renea Ee is faxing the list to [facility].  I will fax list to Endo for [facility] review.

## 2015-05-25 NOTE — Telephone Encounter (Addendum)
Put pt on for PEG today. CALLED FAMILY TO CONTACT ENDOSCOPY FOR CONSENT. [name] IS FIRST CONTACT. REGINA IS SECOND CONTACT. DISCUSSED PROCEDURE, BENEFITS, & RISKS OF EGD/PEG PLACEMENT: < 1% chance of medication reaction, UNINTENTIONAL PERFORATION, SKIN INFECTION, ASPIRATION, HOLE IN THE COLON, NEED FOR HOSPITALIZATION/SURGERY IF PEG GETS PULLED OUT, OR bleeding. NURSING STAFF WITNESSED CONSENT: MELANIE AMBROSE, DONNA PICKERING.

## 2015-05-25 NOTE — Progress Notes (Signed)
Patient nonverbal

## 2015-05-25 NOTE — OR Nursing (Signed)
Report given to Marinda Elk RN at [facility]. Discharge instructions sent via [employer] transportation driver

## 2015-05-25 NOTE — Discharge Instructions (Signed)
YOUR FEEDING TUBE HAS BEEN PLACED. THE TOP OF THE BUMPER IS AT 4 CM.  PLEASE CALL MY OFFICE, [id number],  WITH QUESTIONS REGARDING THE PEG.  RESUME FEEDS AND MEDS FEB 28.    UPPER ENDOSCOPY AFTER CARE Read the instructions outlined below and refer to this sheet in the next week. These discharge instructions provide you with general information on caring for yourself after you leave the hospital. While your treatment has been planned according to the most current medical practices available, unavoidable complications occasionally occur. If you have any problems or questions after discharge, call DR. Quenisha Lovins, [phone].  ACTIVITY  You may resume your regular activity, but move at a slower pace for the next 24 hours.   Take frequent rest periods for the next 24 hours.   Walking will help get rid of the air and reduce the bloated feeling in your belly (abdomen).   No driving for 24 hours (because of the medicine (anesthesia) used during the test).   You may shower.   Do not sign any important legal documents or operate any machinery for 24 hours (because of the anesthesia used during the test).    NUTRITION  Drink plenty of fluids.   You may resume your normal diet as instructed by your doctor.   Begin with a light meal and progress to your normal diet. Heavy or fried foods are harder to digest and may make you feel sick to your stomach (nauseated).   Avoid alcoholic beverages for 24 hours or as instructed.    MEDICATIONS  You may resume your normal medications.   WHAT YOU CAN EXPECT TODAY  Some feelings of bloating in the abdomen.   Passage of more gas than usual.    IF YOU HAD A BIOPSY TAKEN DURING THE UPPER ENDOSCOPY:  No aspirin products for 3 days.   Eat a soft diet IF YOU HAVE NAUSEA, BLOATING, ABDOMINAL PAIN, OR VOMITING.    FINDING OUT THE RESULTS OF YOUR TEST Not all test results are available during your visit. DR. Darrick Penna WILL CALL YOU WITHIN 7 DAYS OF  YOUR PROCEDUE WITH YOUR RESULTS. Do not assume everything is normal if you have not heard from DR. Johntay Doolen IN ONE WEEK, CALL HER OFFICE AT [phone].  SEEK IMMEDIATE MEDICAL ATTENTION AND CALL THE OFFICE: [phone] IF:  You have more than a spotting of blood in your stool.   Your belly is swollen (abdominal distention).   You are nauseated or vomiting.   You have a temperature over 101F.   You have abdominal pain or discomfort that is severe or gets worse throughout the day.  Gastritis  Gastritis is an inflammation (the body's way of reacting to injury and/or infection) of the stomach. It is often caused by viral or bacterial (germ) infections. It can also be caused BY ASPIRIN, BC/GOODY POWDER'S, (IBUPROFEN) MOTRIN, OR ALEVE (NAPROXEN), chemicals (including alcohol), SPICY FOODS, and medications. This illness may be associated with generalized malaise (feeling tired, not well), UPPER ABDOMINAL STOMACH cramps, and fever. One common bacterial cause of gastritis is an organism known as H. Pylori. This can be treated with antibiotics.   Care of a Feeding Tube Site Individuals who have trouble swallowing or cannot take food or medication by mouth are sometimes given feeding tubes. A feeding tube can go into the nose and down to the stomach or through the skin in the abdomen and into the stomach or small bowel. Some of the names of these feeding tubes are gastrostomy  tubes, PEG lines, nasogastric tubes, and gastrojejunostomy tubes.  Liquids or foods that have been made into a thick, smooth soup consistency (pureed), along with medications, may be given through the tube. There are several ways to give the liquid food (formula), and there are several kinds of prescribed formulas. Your caregiver will arrange for you to get nutrition in the right amounts for you.  EQUIPMENT NEEDED FOR TUBE FEEDING  A 60 mL syringe.   Formula suggested by your caregiver or dietician.   A feeding pump or a place  to hang your food container (an IV pole or a wall hook) so it can operate by gravity while you are getting your feedings. You attach the tube from the food container to the end of the feeding tube. Your caregivers will help you with these supplies or tell you where to get them.   PROCEDURE FOR TUBE FEEDING 1. Wash your hands before touching the equipment, food, medications, or the site (where the tube enters your body).  2. Check the tube placement before starting the feeding by removing the plug in the end of the feeding tube and attaching a syringe to the feeding tube. Pull the plunger back and you will usually see some yellowish or greenish fluid. This tells you the tube is in the correct place. Note the amount and gently push the residual back into the tube.  Ask your caregiver if there are instances when you would not start tube feedings depending on the amount or type of contents withdrawn from the stomach.   If gastrointestinal (GI) contents do not show when you pull back on the plunger, measure the length from the stoma site (the opening in your body made for feeding) to the end of your feeding tube. If this different from the previous measurement, you should call your caregiver.   If at any point the feeding tube seems blocked, use your syringe and pull back on the plunger. If this does not work, try to gently force some warm water through the tube. If none of these methods work, call your caregiver. It is important not to miss your medications, food, or water.  3. If everything with the tube seems to be okay, insert the tip of the tube from the food container into your feeding tube.  4. While sitting up or with your head propped up at least 30 degrees to 45 degrees, start the feeding. If you develop coughing or have trouble breathing, stop the feeding immediately.  5. Administer the feeding for the length of time instructed by your caregiver.  6. To keep the tube open, following the feeding,  flush the tube with 30 mLs of water using a syringe, and replace the plug at the end of the feeding tube.  7. Do not leave unused food out between feedings. Refrigerate or store it as directed.   GIVING YOUR MEDICATIONS THROUGH THE TUBE  Use liquid forms of your medications, if available.   Some pills or tablets may be crushed and put into warm water. Do not use hot water, which could affect the contents.   Ask the pharmacist if you can crush your pills. Do not crush capsules that have SR (sustained release), XR (extended release), or CD (controlled release) printed on them unless your caregiver or pharmacist says it is okay.   After you have crushed your pills or capsules into small pieces or a powder, let the pieces dissolve in warm (not hot) water so that no pieces  will clog your tube. Draw medication up into your syringe by pulling back on the plunger.   Attach the syringe to the end of the feeding tube and push on the plunger to give your medication.   Flush the tube with 30 mLs of water after giving your medication. This makes sure you have received all of your medications.   CARE OF THE SKIN AROUND THE ENTRANCE OF THE TUBE  Check the skin daily where the tube enters the abdomen for redness, irritation, drainage, or tenderness.   Clean around the tube daily with water or soap and water using cotton-tipped applicators or gauze squares. Dry completely after cleaning.   Change the dressing around the tube insertion site daily FOR 7 DAYS. THE TOP OF THE BUMPER IS AT 4 CM. THE SKIN [place] AT 4 CM. PLACE GAUZE OVER BUMPER. CHANGE AS NEEDED.  If the dressing becomes wet or soiled, change it as soon as convenient.   You may use tape to fasten your feeding tube to your skin for comfort or do as directed.   SEEK IMMEDIATE MEDICAL CARE IF:   You develop coughing or have trouble breathing during a feeding. Stop the feeding and call your caregiver immediately.   You notice swelling, redness,  drainage, or tenderness at the tube insertion site.   You develop pain,nausea, vomiting, diarrhea, constipation, or bleeding around the tube insertion site.   The tube seems plugged and you are unable to get water through the tube.   There is food leaking from around the tube.   The tube falls out.

## 2015-05-25 NOTE — Op Note (Addendum)
[facility] [address] [place], [address]    date:  05/25/2015  patient:  Bianca, Knight         id:  [id number] birthdate:  Oct 30, 1950 g.i. attending:  West Bali, MD assistant:  referring physician:       DR. Anson Fret procedure:     1.  EGD, SCREENING 2. 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 MD started sedation: 1547. PROCEDURE COMPLETE: 1618 medications:     Demerol 25 mg IV, Versed 2 mg IV, and Ancef 2 gm IV , Viscous Xylocaine  Procedure COMPLETE: 1618 monitoring:  description of procedure: After the risks, benefits, and alternatives to the procedure were explained,  informed consent was obtained.  The patient was anesthetized with topical anesthesia and the EG-[id number] ([id number]) endoscope was introduced through the mouth and passed through the esophagus, stomach, and duodenum. MODERATE NONEROSIBEV GASTRITIS WAS SEEN. NORMAL ESOPHAGUS AND DUODENUM. The stomach was then inflated with air, and by a combination of transillumination and manual palpation, the site for the gastrostomy tube placement was selected and marked on the anterior abdominal wall.  The skin of the anterior abdomen was surgically prepped and draped with sterile towels.  A 1 cm incision was made through the skin and subcutaneous tissue, and the needle/cannula assembly was then passed through the abdominal wall and through the anterior wall of the stomach, maintaining visualization with the endoscope.  A snare device previously placed through the instrument channel was then opened and placed around the cannula, the needle was removed, and the insertion wire was passed through the cannula and into the stomach lumen.  The snare was then loosened from the cannula, and repositioned to snare the insertion wire.  The snare was  then pulled up to the endoscope distal tip, and the scope was then withdrawn bringing with it the snare and insertion wire. Estimated blood loss is zero unless otherwise noted in this procedure report.  The insertion wire was then released from the snare, and then loop-attached to the Bard 20 Fr gastrostomy tube.  Using the "pull technique," the G-tube was then pulled into place by traction on the insertion wire at the abdomonial wall end.  THE TOP OF THE BUMPER IS AT 4 CM AND THE SKIN IS AT 3 CM.     Marland Kitchen  The G-tube insertion site was then cleansed once again, and the external bolster was placed over the tube to secure it to the abdominal wall.  A sterile dressing was then applied and the procedure terminated.  complications:     There were no immediate complications.  post-operative diagnosis:     1. S/p percutaneous endoscopic gastrostomy  2. GASTRITIS  recommendations: CALL OFFICE, [phone], WITH QUESTIONS REGARDING THE PEG. RESUME FEEDS AND MEDS FEB 28.     West Bali, MD

## 2015-05-25 NOTE — H&P (Addendum)
Primary Care Physician:  Colon Branch, MD Primary Gastroenterologist:  Dr. Darrick Penna  Pre-Procedure History & Physical: HPI:  [name] is a 65 y.o. female here for DYSPHAGIA/Peg fell out. SPOKE WITH PT'S SISTER. PEG PLACED IN DEC 2016 AT [facility]. PT NOT SEEN IN OFFICE BEFORE.  Past Medical History  Diagnosis Date  . Multiple sclerosis (HCC)   . Seizures (HCC)   . Dysphagia   . Dehydration   . Hypertension   . Hemiparesis, left (HCC)   . Pneumonia   . Thrombocytopenia (HCC)   . Aphasia   . Depression   . Hypothyroid   . Esophageal reflux   . Stroke (HCC)   . Lack of coordination   . Contracture of ankle and foot joint   . Obesity   . Muscle weakness   . Dysphagia 12/01/2011  . Chronic pain syndrome 12/01/2011  . GERD (gastroesophageal reflux disease)   . 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 Acidosis   . Aphasia     History reviewed. No pertinent past surgical history.  Prior to Admission medications   Medication Sig Start Date End Date Taking? Authorizing Provider  Amino Acids-Protein Hydrolys (FEEDING SUPPLEMENT, PRO-STAT SUGAR FREE 64,) LIQD 30 mLs by Enteral route 2 (two) times daily.   Yes Historical Provider, MD  BIOTIN PO 5,000 mcg by Enteral route daily.    Yes Historical Provider, MD  lacosamide 100 MG TABS Place 1 tablet (100 mg total) into feeding tube 2 (two) times daily. Patient taking differently: 100 mg by Enteral route 2 (two) times daily.  03/09/15  Yes Elliot Cousin, MD  levETIRAcetam (KEPPRA) 500 MG tablet Take 1 tablet (500 mg total) by mouth 2 (two) times daily. Patient taking differently: 500 mg by Enteral route 2 (two) times daily.  03/09/15  Yes Elliot Cousin, MD  levothyroxine (SYNTHROID, LEVOTHROID) 137 MCG tablet 137 mcg by Enteral route daily before breakfast.    Yes Historical Provider, MD  Nutritional Supplements  (FEEDING SUPPLEMENT, JEVITY 1.2 CAL,) LIQD Place 1,000 mLs into feeding tube continuous. 04/15/15  Yes Rhetta Mura, MD  oxyCODONE (ROXICODONE) 5 MG/5ML solution Take 5 mLs (5 mg total) by mouth every 8 (eight) hours. 04/15/15  Yes Rhetta Mura, MD  pantoprazole (PROTONIX) 40 MG tablet 40 mg by Enteral route daily.   Yes Historical Provider, MD  senna (SENOKOT) 8.6 MG tablet 1 tablet by Enteral route 2 (two) times daily.    Yes Historical Provider, MD  acetaminophen (TYLENOL) 325 MG tablet Take 2 tablets (650 mg total) by mouth every 6 (six) hours as needed for mild pain (or Fever >/= 101). 04/15/15   Rhetta Mura, MD  acetaminophen (TYLENOL) 650 MG CR tablet 650 mg by Enteral route daily as needed for pain.    Historical Provider, MD  albuterol (PROVENTIL) (2.5 MG/3ML) 0.083% nebulizer solution Take 2.5 mg by nebulization every 4 (four) hours as needed for wheezing or shortness of breath.    Historical Provider, MD  amoxicillin-clavulanate (AUGMENTIN) 875-125 MG tablet Take 1 tablet by mouth every 12 (twelve) hours. 04/15/15   Rhetta Mura, MD  fentaNYL (DURAGESIC - DOSED MCG/HR) 12 MCG/HR Place 12.5 mcg onto the skin every 3 (three) days.    Historical Provider, MD  insulin aspart (NOVOLOG) 100 UNIT/ML injection  03/09/15   Elliot Cousin, MD  insulin glargine (LANTUS) 100 UNIT/ML injection Inject  0.1 mLs (10 Units total) into the skin at bedtime. 03/09/15   Elliot Cousin, MD  interferon beta-1a (AVONEX) 30 MCG/0.5ML injection Inject 30 mcg into the muscle every 7 (seven) days. On Tuesday    Historical Provider, MD  LORazepam (ATIVAN) 2 MG/ML concentrated solution Place 0.5 mLs (1 mg total) into feeding tube every 4 (four) hours as needed for seizure. Sign or symptoms of seizure activity 04/15/15   Rhetta Mura, MD  LORazepam (ATIVAN) 2 MG/ML injection Inject 1 mg into the muscle every 4 (four) hours as needed for seizure.    Historical Provider, MD  vitamin C (ASCORBIC  ACID) 500 MG tablet 500 mg by Enteral route 2 (two) times daily.     Historical Provider, MD  zinc sulfate 220 MG capsule 220 mg by Enteral route daily.    Historical Provider, MD    Allergies as of 05/25/2015  . (No Known Allergies)    History reviewed. No pertinent family history.  Social History   Social History  . Marital Status: Single    Spouse Name: N/A  . Number of Children: N/A  . Years of Education: N/A   Occupational History  . Not on file.   Social History Main Topics  . Smoking status: Never Smoker   . Smokeless tobacco: Not on file  . Alcohol Use: No  . Drug Use: No  . Sexual Activity: No   Other Topics Concern  . Not on file   Social History Narrative    Review of Systems: See HPI, otherwise negative ROS   Physical Exam: BP 102/68 mmHg  Pulse 117  Temp(Src) 99.3 F (37.4 C) (Axillary)  Resp 18  SpO2 96% General:   UNABLE TO  COMMUNICATE, in NAD Head:  Normocephalic and atraumatic. Neck:  Supple; Lungs:  Clear throughout to auscultation.    Heart:  Regular rate and rhythm. Abdomen:  Soft, nontender and nondistended. Normal bowel sounds, without guarding, and without rebound.   Neurologic:  NO  NEW FOCAL DEFICITS  Impression/Plan:    DYSPHAGIA  PLAN:  EGD/PEG TODAY

## 2015-05-25 NOTE — Telephone Encounter (Signed)
Noted and pt is place on schedule today. Orders entered.

## 2015-05-28 ENCOUNTER — Encounter (HOSPITAL_COMMUNITY): Payer: Self-pay | Admitting: Gastroenterology

## 2015-09-10 ENCOUNTER — Telehealth: Payer: Self-pay

## 2015-09-10 ENCOUNTER — Other Ambulatory Visit: Payer: Self-pay

## 2015-09-10 ENCOUNTER — Ambulatory Visit (HOSPITAL_COMMUNITY)
Admission: RE | Admit: 2015-09-10 | Discharge: 2015-09-10 | Disposition: A | Discharge status: Skilled Nursing Facility | Payer: Medicare Other | Source: Ambulatory Visit | Attending: Gastroenterology | Admitting: Gastroenterology

## 2015-09-10 ENCOUNTER — Encounter (HOSPITAL_COMMUNITY)
Admission: RE | Disposition: A | Discharge status: Skilled Nursing Facility | Payer: Self-pay | Source: Ambulatory Visit | Attending: Gastroenterology

## 2015-09-10 DIAGNOSIS — Z431 Encounter for attention to gastrostomy: Secondary | ICD-10-CM | POA: Insufficient documentation

## 2015-09-10 DIAGNOSIS — R1310 Dysphagia, unspecified: Secondary | ICD-10-CM | POA: Insufficient documentation

## 2015-09-10 DIAGNOSIS — Z931 Gastrostomy status: Secondary | ICD-10-CM

## 2015-09-10 HISTORY — PX: PEG PLACEMENT: SHX5437

## 2015-09-10 SURGERY — REPLACEMENT, PEG TUBE, WITHOUT ENDOSCOPY
Anesthesia: LOCAL

## 2015-09-10 NOTE — Telephone Encounter (Signed)
Noted! [facility] aware

## 2015-09-10 NOTE — Telephone Encounter (Signed)
Send pt to endo for PEG replacement NOW.

## 2015-09-10 NOTE — OR Nursing (Signed)
Patient tolerated PEG tube replacement without difficulty. Report called to Audree Bane LPN at [facility].

## 2015-09-10 NOTE — Discharge Instructions (Signed)
PEG skin at 3cm, top of bumper at 5cm. May use PEG for feeds and meds        Care of a Feeding Tube Feeding tubes are often given to those who have trouble swallowing or cannot take food or medicine. A feeding tube can:   Go into the nose and down to the stomach.  Go through the skin in the belly (abdomen) and into the stomach or small bowel. SUPPLIES NEEDED TO CARE FOR THE TUBE SITE  Clean gloves.  Clean wash cloth, gauze pads, or soft paper towel.  Cotton swabs.  Skin barrier ointment or cream.  Soap and water.  Precut foam pads or gauze (that go around the tube).  Tube tape. TUBE SITE CARE 1. Have all supplies ready. 2. Wash hands well. 3.  Put on clean gloves. 4. Remove dirty foam pads or gauze near the tube site, if present. 5. Check the skin around the tube site for redness, rash, puffiness (swelling), leaking fluid, or extra tissue growth. Call your doctor if you see any of these. 6. Wet the gauze and cotton swabs with water and soap. 7. Wipe the area closest to the tube with cotton swabs. Wipe the surrounding skin with moistened gauze. Rinse with water. 8. Dry the skin and tube site with a dry gauze pad or soft paper towel. Do not use antibiotic ointments at the tube site. 9. If the skin is red, apply petroleum jelly in a circular motion, using a cotton swab. Your doctor may suggest a different cream or ointment. Use what the doctor suggests. 10. Apply a new pre-cut foam pad or gauze around the tube. Tape the edges down. Foam pads or gauze may be left off if there is no fluid at the tube site. 11. Use tape or a device that will attach your feeding tube to your skin or do as directed. Rotate where you tape the tube. 12. Sit the person up. 13. Throw away used supplies. 14. Remove gloves. 15. Wash hands. SUPPLIES NEEDED TO FLUSH A FEEDING TUBE  Clean gloves.  60 mL syringe (that connects to feeding tube).  Towel.  Water. FLUSHING A FEEDING TUBE  1. Have  all supplies ready. 2. Wash hands well. 3. Put on clean gloves. 4. Pull 30 mL of water into the syringe. 5. Bend (kink) the feeding tube while disconnecting it from the feeding-bag tubing or while removing the plug at the end of the tube. 6. Insert the tip of the syringe into the end of the feeding tube. Stop bending the tube. Slowly inject the water. 7. If you cannot inject the water, the person with the feeding tube should lay on their left side. 8. After injecting the water, remove the syringe. 9. Always flush the tube before giving the first medicine, between medicines, and after the final medicine before starting a feeding. 10. Throw away used supplies. 11. Remove gloves. 12. Wash hands.   This information is not intended to replace advice given to you by your health care provider. Make sure you discuss any questions you have with your health care provider.   Document Released: 12/06/2011 Document Reviewed: 12/06/2011 Elsevier Interactive Patient Education [employer]

## 2015-09-10 NOTE — Telephone Encounter (Signed)
Evelyn from [facility] called and states that pts peg came out and that it is being held open by a foley cath.  Please advise

## 2015-09-16 NOTE — Procedures (Addendum)
  PROCEDURE TECHNIQUE:  PT PREPPED AND DRAPED. 20 fr FOLEY REMOVED. BALLOON CHECKED. FOLEY REPLACED WITH 20 FR BALLOON PEG. 6 CC STERILE WATER IN BALLOON. SKIN AT 3.0 CM. TOP OF BUMPER AT 5.0 CM. ALL PORTS FLUSHED AND ASPIRATED.  PLAN: 1. TUBE FEEDS/MEDS TODAY. 2. OPV PRN.

## 2015-09-22 ENCOUNTER — Encounter (HOSPITAL_COMMUNITY): Payer: Self-pay | Admitting: Gastroenterology

## 2016-01-01 ENCOUNTER — Inpatient Hospital Stay (HOSPITAL_COMMUNITY)
Admission: EM | Admit: 2016-01-01 | Discharge: 2016-01-04 | DRG: 871 | Payer: Medicare Other | Attending: Internal Medicine | Admitting: Internal Medicine

## 2016-01-01 ENCOUNTER — Emergency Department (HOSPITAL_COMMUNITY): Payer: Medicare Other

## 2016-01-01 ENCOUNTER — Encounter (HOSPITAL_COMMUNITY): Payer: Self-pay | Admitting: Emergency Medicine

## 2016-01-01 DIAGNOSIS — K559 Vascular disorder of intestine, unspecified: Secondary | ICD-10-CM | POA: Diagnosis present

## 2016-01-01 DIAGNOSIS — R198 Other specified symptoms and signs involving the digestive system and abdomen: Secondary | ICD-10-CM

## 2016-01-01 DIAGNOSIS — Z7189 Other specified counseling: Secondary | ICD-10-CM

## 2016-01-01 DIAGNOSIS — Z8701 Personal history of pneumonia (recurrent): Secondary | ICD-10-CM | POA: Diagnosis not present

## 2016-01-01 DIAGNOSIS — N179 Acute kidney failure, unspecified: Secondary | ICD-10-CM | POA: Diagnosis present

## 2016-01-01 DIAGNOSIS — I69320 Aphasia following cerebral infarction: Secondary | ICD-10-CM

## 2016-01-01 DIAGNOSIS — Z931 Gastrostomy status: Secondary | ICD-10-CM | POA: Diagnosis not present

## 2016-01-01 DIAGNOSIS — Z79899 Other long term (current) drug therapy: Secondary | ICD-10-CM | POA: Diagnosis not present

## 2016-01-01 DIAGNOSIS — I10 Essential (primary) hypertension: Secondary | ICD-10-CM | POA: Diagnosis present

## 2016-01-01 DIAGNOSIS — Z23 Encounter for immunization: Secondary | ICD-10-CM

## 2016-01-01 DIAGNOSIS — Z515 Encounter for palliative care: Secondary | ICD-10-CM | POA: Diagnosis present

## 2016-01-01 DIAGNOSIS — Z794 Long term (current) use of insulin: Secondary | ICD-10-CM | POA: Diagnosis not present

## 2016-01-01 DIAGNOSIS — E039 Hypothyroidism, unspecified: Secondary | ICD-10-CM | POA: Diagnosis present

## 2016-01-01 DIAGNOSIS — K219 Gastro-esophageal reflux disease without esophagitis: Secondary | ICD-10-CM | POA: Diagnosis present

## 2016-01-01 DIAGNOSIS — R4701 Aphasia: Secondary | ICD-10-CM | POA: Diagnosis not present

## 2016-01-01 DIAGNOSIS — G35 Multiple sclerosis: Secondary | ICD-10-CM | POA: Diagnosis present

## 2016-01-01 DIAGNOSIS — N1330 Unspecified hydronephrosis: Secondary | ICD-10-CM

## 2016-01-01 DIAGNOSIS — Z7401 Bed confinement status: Secondary | ICD-10-CM

## 2016-01-01 DIAGNOSIS — Y95 Nosocomial condition: Secondary | ICD-10-CM | POA: Diagnosis present

## 2016-01-01 DIAGNOSIS — Z6823 Body mass index (BMI) 23.0-23.9, adult: Secondary | ICD-10-CM

## 2016-01-01 DIAGNOSIS — R000 Tachycardia, unspecified: Secondary | ICD-10-CM | POA: Diagnosis present

## 2016-01-01 DIAGNOSIS — Z87891 Personal history of nicotine dependence: Secondary | ICD-10-CM

## 2016-01-01 DIAGNOSIS — G894 Chronic pain syndrome: Secondary | ICD-10-CM | POA: Diagnosis present

## 2016-01-01 DIAGNOSIS — G40909 Epilepsy, unspecified, not intractable, without status epilepticus: Secondary | ICD-10-CM | POA: Diagnosis present

## 2016-01-01 DIAGNOSIS — E669 Obesity, unspecified: Secondary | ICD-10-CM | POA: Diagnosis present

## 2016-01-01 DIAGNOSIS — J189 Pneumonia, unspecified organism: Secondary | ICD-10-CM | POA: Diagnosis present

## 2016-01-01 DIAGNOSIS — A419 Sepsis, unspecified organism: Secondary | ICD-10-CM | POA: Diagnosis present

## 2016-01-01 DIAGNOSIS — L8990 Pressure ulcer of unspecified site, unspecified stage: Secondary | ICD-10-CM | POA: Insufficient documentation

## 2016-01-01 DIAGNOSIS — Z9889 Other specified postprocedural states: Secondary | ICD-10-CM

## 2016-01-01 DIAGNOSIS — Z66 Do not resuscitate: Secondary | ICD-10-CM | POA: Diagnosis present

## 2016-01-01 DIAGNOSIS — A4150 Gram-negative sepsis, unspecified: Secondary | ICD-10-CM | POA: Diagnosis not present

## 2016-01-01 LAB — CBC WITH DIFFERENTIAL/PLATELET
Basophils Absolute: 0 10*3/uL (ref 0.0–0.1)
Basophils Relative: 0 %
EOS ABS: 0 10*3/uL (ref 0.0–0.7)
Eosinophils Relative: 0 %
HEMATOCRIT: 44.2 % (ref 36.0–46.0)
HEMOGLOBIN: 13.8 g/dL (ref 12.0–15.0)
LYMPHS ABS: 0.6 10*3/uL — AB (ref 0.7–4.0)
Lymphocytes Relative: 5 %
MCH: 22.8 pg — AB (ref 26.0–34.0)
MCHC: 31.2 g/dL (ref 30.0–36.0)
MCV: 73.1 fL — ABNORMAL LOW (ref 78.0–100.0)
MONOS PCT: 4 %
Monocytes Absolute: 0.4 10*3/uL (ref 0.1–1.0)
NEUTROS PCT: 90 %
Neutro Abs: 9.4 10*3/uL — ABNORMAL HIGH (ref 1.7–7.7)
Platelets: 362 10*3/uL (ref 150–400)
RBC: 6.05 MIL/uL — ABNORMAL HIGH (ref 3.87–5.11)
RDW: 19.8 % — ABNORMAL HIGH (ref 11.5–15.5)
WBC: 10.5 10*3/uL (ref 4.0–10.5)

## 2016-01-01 LAB — URINALYSIS, ROUTINE W REFLEX MICROSCOPIC
Bilirubin Urine: NEGATIVE
GLUCOSE, UA: NEGATIVE mg/dL
Ketones, ur: NEGATIVE mg/dL
Nitrite: NEGATIVE
SPECIFIC GRAVITY, URINE: 1.02 (ref 1.005–1.030)
pH: 7.5 (ref 5.0–8.0)

## 2016-01-01 LAB — COMPREHENSIVE METABOLIC PANEL
ALBUMIN: 3.3 g/dL — AB (ref 3.5–5.0)
ALT: 20 U/L (ref 14–54)
AST: 50 U/L — AB (ref 15–41)
Alkaline Phosphatase: 113 U/L (ref 38–126)
Anion gap: 19 — ABNORMAL HIGH (ref 5–15)
BUN: 72 mg/dL — ABNORMAL HIGH (ref 6–20)
CHLORIDE: 98 mmol/L — AB (ref 101–111)
CO2: 18 mmol/L — AB (ref 22–32)
CREATININE: 2.29 mg/dL — AB (ref 0.44–1.00)
Calcium: 9.8 mg/dL (ref 8.9–10.3)
GFR calc non Af Amer: 21 mL/min — ABNORMAL LOW (ref >60)
GFR, EST AFRICAN AMERICAN: 25 mL/min — AB (ref >60)
Glucose, Bld: 189 mg/dL — ABNORMAL HIGH (ref 65–99)
Potassium: 5 mmol/L (ref 3.5–5.1)
SODIUM: 135 mmol/L (ref 135–145)
Total Bilirubin: 0.9 mg/dL (ref 0.3–1.2)
Total Protein: 9.4 g/dL — ABNORMAL HIGH (ref 6.5–8.1)

## 2016-01-01 LAB — I-STAT CG4 LACTIC ACID, ED
LACTIC ACID, VENOUS: 8.9 mmol/L — AB (ref 0.5–1.9)
LACTIC ACID, VENOUS: 9.21 mmol/L — AB (ref 0.5–1.9)

## 2016-01-01 LAB — URINE MICROSCOPIC-ADD ON

## 2016-01-01 LAB — MRSA PCR SCREENING: MRSA by PCR: NEGATIVE

## 2016-01-01 MED ORDER — SODIUM CHLORIDE 0.9 % IV SOLN: 250.0000 mL | INTRAVENOUS | Status: DC | PRN

## 2016-01-01 MED ORDER — INFLUENZA VAC SPLIT QUAD 0.5 ML IM SUSY: 0.5000 mL | PREFILLED_SYRINGE | INTRAMUSCULAR | Status: DC

## 2016-01-01 MED ORDER — PNEUMOCOCCAL VAC POLYVALENT 25 MCG/0.5ML IJ INJ: 0.5000 mL | INJECTION | INTRAMUSCULAR | Status: DC

## 2016-01-01 MED ORDER — LEVETIRACETAM 500 MG PO TABS
500.0000 mg | ORAL_TABLET | Freq: Two times a day (BID) | ORAL | Status: DC
  Administered 2016-01-01 – 2016-01-03 (×4): 500 mg via ORAL
  Filled 2016-01-01 (×4): qty 1

## 2016-01-01 MED ORDER — HALOPERIDOL 0.5 MG PO TABS
0.5000 mg | ORAL_TABLET | ORAL | Status: DC | PRN
  Filled 2016-01-01: qty 1

## 2016-01-01 MED ORDER — ACETAMINOPHEN 650 MG RE SUPP: 650.0000 mg | Freq: Four times a day (QID) | RECTAL | Status: DC | PRN

## 2016-01-01 MED ORDER — VANCOMYCIN HCL 10 G IV SOLR
1250.0000 mg | Freq: Once | INTRAVENOUS | Status: AC
  Administered 2016-01-01: 1250 mg via INTRAVENOUS
  Filled 2016-01-01: qty 1250

## 2016-01-01 MED ORDER — GLYCOPYRROLATE 0.2 MG/ML IJ SOLN
0.2000 mg | INTRAMUSCULAR | Status: DC | PRN
  Filled 2016-01-01: qty 1

## 2016-01-01 MED ORDER — LACOSAMIDE 100 MG PO TABS: 100.0000 mg | ORAL_TABLET | Freq: Two times a day (BID) | ORAL | Status: DC

## 2016-01-01 MED ORDER — SODIUM CHLORIDE 0.9% FLUSH
3.0000 mL | Freq: Two times a day (BID) | INTRAVENOUS | Status: DC
  Administered 2016-01-01 – 2016-01-03 (×4): 3 mL via INTRAVENOUS

## 2016-01-01 MED ORDER — ONDANSETRON 4 MG PO TBDP: 4.0000 mg | ORAL_TABLET | Freq: Four times a day (QID) | ORAL | Status: DC | PRN

## 2016-01-01 MED ORDER — LORAZEPAM 2 MG/ML IJ SOLN: 1.0000 mg | INTRAMUSCULAR | Status: DC | PRN

## 2016-01-01 MED ORDER — ALBUTEROL SULFATE (2.5 MG/3ML) 0.083% IN NEBU: 2.5000 mg | INHALATION_SOLUTION | RESPIRATORY_TRACT | Status: DC | PRN

## 2016-01-01 MED ORDER — SODIUM CHLORIDE 0.9 % IV SOLN: Freq: Once | INTRAVENOUS | Status: DC

## 2016-01-01 MED ORDER — LACOSAMIDE 50 MG PO TABS
100.0000 mg | ORAL_TABLET | Freq: Two times a day (BID) | ORAL | Status: DC
  Administered 2016-01-01 – 2016-01-03 (×4): 100 mg via ORAL
  Filled 2016-01-01 (×4): qty 2

## 2016-01-01 MED ORDER — PIPERACILLIN-TAZOBACTAM 3.375 G IVPB 30 MIN
3.3750 g | Freq: Once | INTRAVENOUS | Status: AC
  Administered 2016-01-01: 3.375 g via INTRAVENOUS
  Filled 2016-01-01: qty 50

## 2016-01-01 MED ORDER — SODIUM CHLORIDE 0.9% FLUSH: 3.0000 mL | INTRAVENOUS | Status: DC | PRN

## 2016-01-01 MED ORDER — BIOTENE DRY MOUTH MT LIQD: 15.0000 mL | OROMUCOSAL | Status: DC | PRN

## 2016-01-01 MED ORDER — ACETAMINOPHEN 650 MG RE SUPP
650.0000 mg | Freq: Once | RECTAL | Status: DC
Start: 2016-01-01 — End: 2016-01-04

## 2016-01-01 MED ORDER — MORPHINE SULFATE (PF) 2 MG/ML IV SOLN
2.0000 mg | INTRAVENOUS | Status: DC | PRN
  Administered 2016-01-02 – 2016-01-03 (×2): 2 mg via INTRAVENOUS
  Filled 2016-01-01 (×2): qty 1

## 2016-01-01 MED ORDER — GLYCOPYRROLATE 1 MG PO TABS
1.0000 mg | ORAL_TABLET | ORAL | Status: DC | PRN
  Filled 2016-01-01: qty 1

## 2016-01-01 MED ORDER — VANCOMYCIN HCL IN DEXTROSE 750-5 MG/150ML-% IV SOLN
750.0000 mg | INTRAVENOUS | Status: DC
  Filled 2016-01-01 (×2): qty 150

## 2016-01-01 MED ORDER — ACETAMINOPHEN 325 MG PO TABS: 650.0000 mg | ORAL_TABLET | Freq: Four times a day (QID) | ORAL | Status: DC | PRN

## 2016-01-01 MED ORDER — HALOPERIDOL LACTATE 2 MG/ML PO CONC
0.5000 mg | ORAL | Status: DC | PRN
  Filled 2016-01-01: qty 0.3

## 2016-01-01 MED ORDER — ACETAMINOPHEN 650 MG RE SUPP
650.0000 mg | Freq: Once | RECTAL | Status: AC
  Administered 2016-01-01: 650 mg via RECTAL
  Filled 2016-01-01: qty 1

## 2016-01-01 MED ORDER — ONDANSETRON HCL 4 MG/2ML IJ SOLN: 4.0000 mg | Freq: Four times a day (QID) | INTRAMUSCULAR | Status: DC | PRN

## 2016-01-01 MED ORDER — HALOPERIDOL LACTATE 5 MG/ML IJ SOLN: 0.5000 mg | INTRAMUSCULAR | Status: DC | PRN

## 2016-01-01 MED ORDER — POLYVINYL ALCOHOL 1.4 % OP SOLN: 1.0000 [drp] | Freq: Four times a day (QID) | OPHTHALMIC | Status: DC | PRN

## 2016-01-01 MED ORDER — SODIUM CHLORIDE 0.9 % IV BOLUS (SEPSIS)
2500.0000 mL | Freq: Once | INTRAVENOUS | Status: AC
Start: 2016-01-01 — End: 2016-01-01
  Administered 2016-01-01: 2500 mL via INTRAVENOUS

## 2016-01-01 NOTE — ED Provider Notes (Addendum)
[facility] DE[facility] Provider Note   CSN: [id number] Arrival date & time: 01/01/16  1136  By signing my name below, I, [name], attest that this documentation has been prepared under the direction and in the presence of [name], MD . Electronically Signed: [name], Scribe. 01/01/2016. 12:12 PM.  History   Chief Complaint Chief Complaint  Patient presents with  . Shortness of Breath   The history is provided by medical records and the nursing home. No language interpreter was used.   LEVEL 5 CAVEAT: HPI and ROS limited due to Pt Nonverbal   HPI Comments: [name] is a 65 y.o. female with PMHx of stroke and HTN, who presents to the Emergency Department for an evaluation of gradually worsening, shortness of breath, tachypnea and fever that began last night and worsened today. Pt is a resident of [facility]; nursing staff at this location reported pt's symptoms of shortness of breath began before the onset of her fever (TMAX 103.7).  Past Medical History:  Diagnosis Date  . Acidosis   . Aphasia   . Aphasia   . Chronic pain syndrome 12/01/2011  . Contracture of ankle and foot joint   . Dehydration   . Depression   . Dysphagia   . Dysphagia 12/01/2011  . Esophageal reflux   . GERD (gastroesophageal reflux disease)   . Hemiparesis, left (HCC)   . Hypertension   . Hypothyroid   . Lack of coordination   . Multiple sclerosis (HCC)   . Muscle weakness   . Obesity   . Pneumonia   . Seizures (HCC)   . 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 Status post insertion of percutaneous endoscopic gastrostomy (PEG) tube (HCC) 03/01/2015   at [facility]  . Stroke (HCC)   . Thrombocytopenia [facility])     Patient Active Problem List   Diagnosis Date Noted  . PEG (percutaneous endoscopic gastrostomy) adjustment/replacement/removal (HCC)   . DNR (do not resuscitate) 04/14/2015  . Status epilepticus (HCC) 04/13/2015  . Seizure (HCC) 04/13/2015  . UTI (lower  urinary tract infection) 04/13/2015  . Hyperkalemia 03/26/2015  . Dehydration with hyponatremia 03/26/2015  . Acute blood loss anemia 03/26/2015  . Palliative care encounter   . UGI bleed 03/04/2015  . HCAP (healthcare-associated pneumonia) 03/03/2015  . Seizure disorder (HCC) 03/03/2015  . Status post insertion of percutaneous endoscopic gastrostomy (PEG) tube (HCC) 03/03/2015  . AKI (acute kidney injury) (HCC) 03/03/2015  . Hypotension (arterial) 03/03/2015  . Hyperglycemia 03/03/2015  . 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 Bradycardia 12/01/2011  . Dysphagia 12/01/2011  . Chronic pain syndrome 12/01/2011  . Lethargy 11/30/2011  . Decreased responsiveness 11/30/2011  . Thrombocytopenia (HCC) 09/08/2011  . Multiple sclerosis (HCC)   . Seizures (HCC)   . Hemiparesis, left (HCC)   . Hypertension   . Hypothyroid   . Depression   . Aphasia   . Obesity   . Muscle weakness     Past Surgical History:  Procedure Laterality Date  . ESOPHAGOGASTRODUODENOSCOPY N/A 05/25/2015   Procedure: ESOPHAGOGASTRODUODENOSCOPY (EGD);  Surgeon: West Bali, MD;  Location: [facility];  Service: Endoscopy;  Laterality: N/A;  1430  . PEG PLACEMENT N/A 05/25/2015   Procedure: PERCUTANEOUS ENDOSCOPIC GASTROSTOMY (PEG) REPLACEMENT;  Surgeon: West Bali, MD;  Location: [facility];  Service: Endoscopy;  Laterality: N/A;  . PEG PLACEMENT N/A 09/10/2015   Procedure: PERCUTANEOUS ENDOSCOPIC GASTROSTOMY (PEG) REPLACEMENT;  Surgeon:  West Bali, MD;  Location: [facility];  Service: Endoscopy;  Laterality: N/A;  1000     OB History    No data available     Home Medications    Prior to Admission medications   Medication Sig Start Date End Date Taking? Authorizing Provider  acetaminophen (TYLENOL) 650 MG CR tablet 650 mg by Enteral route daily as needed for pain.   Yes Historical Provider, MD  albuterol (PROVENTIL) (2.5 MG/3ML) 0.083% nebulizer solution Take 2.5 mg  by nebulization every 4 (four) hours as needed for wheezing or shortness of breath.   Yes Historical Provider, MD  Amino Acids-Protein Hydrolys (FEEDING SUPPLEMENT, PRO-STAT SUGAR FREE 64,) LIQD 30 mLs by Enteral route 2 (two) times daily.   Yes Historical Provider, MD  BIOTIN PO 5,000 mcg by Enteral route daily.    Yes Historical Provider, MD  collagenase (SANTYL) ointment Apply 1 application topically daily.   Yes Historical Provider, MD  diazepam (VALIUM) 5 MG tablet 5 mg every 6 (six) hours as needed for anxiety. Via Peg-tube   Yes Historical Provider, MD  fentaNYL (DURAGESIC - DOSED MCG/HR) 12 MCG/HR Place 12.5 mcg onto the skin every 3 (three) days.   Yes Historical Provider, MD  insulin aspart (NOVOLOG) 100 UNIT/ML injection Sliding-scale NovoLog 2 times a day to 3 times a day: CBG 70-120 no units. CBG 151-200 give 2 units. CBG 201-250 give 3 units. CBG 251-300 give 6 units. CBG 301-350 give 8 units. CBG 351-400 give 10 units. CBG greater than 400 give 12 units and call M.D. Patient taking differently: Inject 2-4 Units into the skin 3 (three) times daily with meals. Sliding-scale NovoLog 2 times a day to 3 times a day: CBG 70-120 no units. CBG 151-200 give 2 units. CBG 201-250 give 3 units. CBG 251-300 give 6 units. CBG 301-350 give 8 units. CBG 351-400 give 10 units. CBG greater than 400 give 12 units and call M.D. 03/09/15  Yes Elliot Cousin, MD  interferon beta-1a (AVONEX) 30 MCG/0.5ML injection Inject 30 mcg into the muscle every 7 (seven) days. On Tuesday   Yes Historical Provider, MD  lacosamide 100 MG TABS Place 1 tablet (100 mg total) into feeding tube 2 (two) times daily. Patient taking differently: 100 mg by Enteral route 2 (two) times daily.  03/09/15  Yes Elliot Cousin, MD  levETIRAcetam (KEPPRA) 500 MG tablet Take 1 tablet (500 mg total) by mouth 2 (two) times daily. Patient taking differently: 500 mg by Enteral route 2 (two) times daily.  03/09/15  Yes Elliot Cousin, MD    levothyroxine (SYNTHROID, LEVOTHROID) 137 MCG tablet 137 mcg by Enteral route daily before breakfast.    Yes Historical Provider, MD  LORazepam (ATIVAN) 2 MG/ML concentrated solution Place 0.5 mLs (1 mg total) into feeding tube every 4 (four) hours as needed for seizure. Sign or symptoms of seizure activity 04/15/15  Yes Rhetta Mura, MD  oxyCODONE (ROXICODONE) 5 MG/5ML solution Take 5 mLs (5 mg total) by mouth every 8 (eight) hours. 04/15/15  Yes Rhetta Mura, MD  pantoprazole (PROTONIX) 40 MG tablet 40 mg by Enteral route daily.   Yes Historical Provider, MD  senna (SENOKOT) 8.6 MG tablet 1 tablet by Enteral route 2 (two) times daily.    Yes Historical Provider, MD    Family History No family history on file.  Social History Social History  Substance Use Topics  . Smoking status: Never Smoker  . Smokeless tobacco: Not on file  . Alcohol use  No     Allergies   Review of patient's allergies indicates no known allergies.   Review of Systems Review of Systems  Unable to perform ROS: Patient nonverbal   Physical Exam Updated Vital Signs BP 115/77   Pulse (!) 168   Temp (!) 103.7 F (39.8 C) (Rectal)   Resp (!) 40   Ht 5\' 7"  (1.702 m)   Wt 150 lb (68 kg)   SpO2 95%   BMI 23.49 kg/m   Physical Exam  Constitutional: She appears well-developed and well-nourished. No distress.  Nonverbal, rightward gaze.   HENT:  Head: Normocephalic.  Mucous membranes dry  Eyes: Conjunctivae are normal. Pupils are equal, round, and reactive to light. No scleral icterus.  Neck: Normal range of motion. Neck supple. No thyromegaly present.  Cardiovascular: Normal rate and regular rhythm.  Exam reveals no gallop and no friction rub.   No murmur heard. Tachycardia, sinus tach 170  Pulmonary/Chest: She has no rales.  Tachypnic  Bibasilar rhonchi   Abdominal:  G-tube in upper abdomen  Rigid, peritoneal irritation, moans with exam, hypoactive bowel sounds   Musculoskeletal:  Normal range of motion.  Boot on LLE, skin intact  Skin: Skin is warm and dry. No rash noted.  2 well granulated stage III decubiti on sacrum  Psychiatric: She has a normal mood and affect. Her behavior is normal.   ED Treatments / Results  Labs (all labs ordered are listed, but only abnormal results are displayed) Labs Reviewed  COMPREHENSIVE METABOLIC PANEL - Abnormal; Notable for the following:       Result Value   Chloride 98 (*)    CO2 18 (*)    Glucose, Bld 189 (*)    BUN 72 (*)    Creatinine, Ser 2.29 (*)    Total Protein 9.4 (*)    Albumin 3.3 (*)    AST 50 (*)    GFR calc non Af Amer 21 (*)    GFR calc Af Amer 25 (*)    Anion gap 19 (*)    All other components within normal limits  CBC WITH DIFFERENTIAL/PLATELET - Abnormal; Notable for the following:    RBC 6.05 (*)    MCV 73.1 (*)    MCH 22.8 (*)    RDW 19.8 (*)    Neutro Abs 9.4 (*)    Lymphs Abs 0.6 (*)    All other components within normal limits  URINALYSIS, ROUTINE W REFLEX MICROSCOPIC (NOT AT [facility]) - Abnormal; Notable for the following:    APPearance TURBID (*)    Hgb urine dipstick LARGE (*)    Protein, ur >300 (*)    Leukocytes, UA LARGE (*)    All other components within normal limits  URINE MICROSCOPIC-ADD ON - Abnormal; Notable for the following:    Squamous Epithelial / LPF 0-5 (*)    Bacteria, UA MANY (*)    All other components within normal limits  I-STAT CG4 LACTIC ACID, ED - Abnormal; Notable for the following:    Lactic Acid, Venous 8.90 (*)    All other components within normal limits  CULTURE, BLOOD (ROUTINE X 2)  CULTURE, BLOOD (ROUTINE X 2)  URINE CULTURE  I-STAT CG4 LACTIC ACID, ED    EKG  EKG Interpretation None       Radiology Dg Chest Portable 1 View  Result Date: 01/01/2016 CLINICAL DATA:  Shortness of breath and tachypnea. Fever. Sepsis. Possible aspiration. EXAM: PORTABLE CHEST 1 VIEW COMPARISON:  04/15/2015 FINDINGS: Low  lung volumes are noted. Asymmetric airspace  opacity is seen in the right mid and lower lung fields, suspicious for pneumonia. Left lung is clear. No evidence of pneumothorax or pleural effusion. Heart size is within normal limits. IMPRESSION: Asymmetric airspace disease in right mid and lower lung fields, suspicious for pneumonia. Electronically Signed   By: Myles RosenthalJohn  Stahl M.D.   On: 01/01/2016 12:35   Ct Renal Stone Study  Result Date: 01/01/2016 CLINICAL DATA:  Abdominal guarding, gradually worsening sob, tachypnea, fever. Hx of stroke, peg, MS,HTN, GERD. EXAM: CT ABDOMEN AND PELVIS WITHOUT CONTRAST TECHNIQUE: Multidetector CT imaging of the abdomen and pelvis was performed following the standard protocol without IV contrast. COMPARISON:  None. FINDINGS: Lower chest: There is consolidation in the right lower lobe. Although this may be all atelectasis, pneumonia is suspected. Milder opacity in the left lung base is consistent with atelectasis. Heart is normal in size. Hepatobiliary: Portal venous air is seen throughout portal branches in the left lobe. No liver mass or focal lesion. There are prominent gallstones. No convincing gallbladder wall thickening. No bile duct dilation. Pancreas: Unremarkable. No pancreatic ductal dilatation or surrounding inflammatory changes. Spleen: Normal in size without focal abnormality. Adrenals/Urinary Tract: No adrenal mass. There is marked right and moderate left hydronephrosis and hydroureter. Ureters are distended all the way to the bladder. No ureteral stones. Bladder is partly decompressed by Foley catheter. No convincing bladder mass. Balloon of the catheter lies directly adjacent to the luminal side of the ureterovesicular junctions. No renal masses or stones. Stomach/Bowel: Pneumatosis intestinalis is seen along the ileum would air extending into portal venous branches colon is mild-to-moderately distended most evident along the right colon. The ascending colon measures 6 cm in diameter. There is increase colonic  and rectal stool. No colon pneumatosis intestinalis is evident. No colonic wall thickening. A percutaneously placed gastrostomy tube has its tip in the distal stomach. Vascular/Lymphatic: Mild atherosclerotic calcifications noted along a normal caliber abdominal aorta. No discrete enlarged lymph nodes. Reproductive: Status post hysterectomy. No adnexal masses. Other: Trace amount of ascites noted along the pericolic gutters and in the posterior pelvic recess. No abdominal hernia. Musculoskeletal: Compression fracture of L4, most likely old. There is sclerosis along the visualized right ischium, as well as the lower sacrum, which appears to be the result of previous osteomyelitis. A soft tissue defect consistent with a decubitus ulcer overlies the lower sacrum and coccyx. There is a defect along the posterior margin of the right ilium also consistent with chronic osteomyelitis. IMPRESSION: 1. 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 This is concerning for ischemic bowel although there is no significant small bowel wall thickening. 2. Right lower lobe consolidation consistent with pneumonia. 3. Marked right and moderate left hydronephrosis and hydroureter. No ureteral stone. No bladder mass. The Foley catheter balloon does appear to compress the ureterovesicular junctions and may be the cause of hydronephrosis. 4. Trace amount of ascites. 5. Moderate increased stool in the colon and rectum. 6. Gallstones.  No convincing acute cholecystitis. 7. Compression fracture of L4, most likely old. 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 Electronically Signed   By: Amie Portlandavid  Ormond M.D.   On: 01/01/2016 13:44   Procedures Procedures (including critical care time)  Medications Ordered in ED Medications  acetaminophen (TYLENOL) suppository 650 mg (650 mg Rectal Not Given 01/01/16 1227)  vancomycin (VANCOCIN) IVPB 750 mg/150 ml premix (not administered)  acetaminophen  (TYLENOL) suppository 650 mg (650 mg Rectal Given  01/01/16 1216)  piperacillin-tazobactam (ZOSYN) IVPB 3.375 g (0 g Intravenous Stopped 01/01/16 1300)  sodium chloride 0.9 % bolus 2,500 mL (2,500 mLs Intravenous New Bag/Given 01/01/16 1222)  vancomycin (VANCOCIN) 1,250 mg in sodium chloride 0.9 % 250 mL IVPB (1,250 mg Intravenous New Bag/Given 01/01/16 1321)    DIAGNOSTIC STUDIES:  Oxygen Saturation is 95% on RA, normal by my interpretation.    COORDINATION OF CARE:  12:09 PM Discussed treatment plan with pt at bedside and pt agreed to plan.  Initial Impression / Assessment and Plan / ED Course  I have reviewed the triage vital signs and the nursing notes.  Pertinent labs & imaging results that were available during my care of the patient were reviewed by me and considered in my medical decision making (see chart for details).  Clinical Course   Patient presents septic. Hypotensive, tachycardic, febrile, tachypneic, hypoxemic. Rigid abdomen. CT suggesting ischemic bowel with pneumobilia. Pneumonia on CT and x-ray. Given fluid boluses and broad-spectrum antibiotics. I discussed the patient with her sister [name] by phone. Sister is house bound and cannot present here. Reiterates that the patient is to be DO NOT RESUSCITATE.   This with hospitalist. We'll treat and offer comfort measures. Will discuss with surgery. Patient's sister is fairly adamant that she would not want surgery unless she was certain the patient was revised.    I personally performed the services described in this documentation, which was scribed in my presence. The recorded information has been reviewed and is accurate.   Final Clinical Impressions(s) / ED Diagnoses   Final diagnoses:  Abdominal guarding  Sepsis (HCC)  Ischemic bowel disease (HCC)  Sepsis, due to unspecified organism (HCC)  HCAP (healthcare-associated pneumonia)    CRITICAL CARE Performed by: [name] JOSEPH   Total critical care time: 60  minutes  Critical care time was exclusive of separately billable procedures and treating other patients.  Critical care was necessary to treat or prevent imminent or life-threatening deterioration.  Critical care was time spent personally by me on the following activities: development of treatment plan with patient and/or surrogate as well as nursing, discussions with consultants, evaluation of patient's response to treatment, examination of patient, obtaining history from patient or surrogate, ordering and performing treatments and interventions, ordering and review of laboratory studies, ordering and review of radiographic studies, pulse oximetry and re-evaluation of patient's condition.   New Prescriptions New Prescriptions   No medications on file     [name], MD 01/01/16 1527    [name], MD 01/01/16 1531    [name], MD 01/01/16 1534

## 2016-01-01 NOTE — ED Notes (Signed)
Wounds to sacrum and right buttocks noted. See EDP assessment for further.

## 2016-01-01 NOTE — ED Notes (Signed)
EDP at bedside speaking with pt niece.

## 2016-01-01 NOTE — ED Triage Notes (Signed)
Per EMS- pt sent to ED from [facility] for fever, SOB and tachypnea. Possible aspiration of PEG tube feeding?

## 2016-01-01 NOTE — Progress Notes (Signed)
Pharmacy Antibiotic Note  [name] is a 65 y.o. female admitted on 01/01/2016 with pneumonia.  Pharmacy has been consulted for Vancomycin dosing.  Plan: Vancomycin 1250 mg IV loading dose, then Vancomycin 750 mg IV every 24 hours VT at steady state Monitor labs  Height: 5\' 7"  (170.2 cm) Weight: 150 lb (68 kg) IBW/kg (Calculated) : 61.6  Temp (24hrs), Avg:103.7 F (39.8 C), Min:103.7 F (39.8 C), Max:103.7 F (39.8 C)   Recent Labs Lab 01/01/16 1155 01/01/16 1205  WBC 10.5  --   CREATININE 2.29*  --   LATICACIDVEN  --  8.90*    Estimated Creatinine Clearance: 23.8 mL/min (by C-G formula based on SCr of 2.29 mg/dL (H)).    No Known Allergies  Antimicrobials this admission: Vancomycin 10/7 >>  Zosyn 10/7 >>   Dose adjustments this admission:   [name], [name] 01/01/2016 12:50 PM

## 2016-01-01 NOTE — H&P (Signed)
History and Physical    [name] ZOX:[id number] DOB: [birth year] DOA: 01/01/2016  PCP: Colon BranchQURESHI, AYYAZ, MD  Patient coming from: [facility]  Chief Complaint: fever  HPI: [name] is a 65 y.o. female with medical history significant of previous stroke, atherosclerosis who is essentially bedbound, nonverbal and has a PEG tube. Patient is a resident of a skilled nursing facility. She is unable to provide any history and therefore history is obtained from medical record. She comes to the ER today with shortness of breath and fever that began the night before admission. Since progressively gotten worse.  ED Course: On evaluation in the emergency room she does not appear febrile with a temperature 103. She was also tachycardic with a heart rate in the 130s to 160s. Chest x-ray indicated evidence of pneumonia. Lactic acid was noted to be markedly elevated at 8.9. She had significant tenderness and rigidity on abdominal exam and therefore CT abdomen was obtained. Since she had acute renal failure, this was a noncontrast study. It did show portal venous gas and pneumatosis intestinalis consistent with ischemic bowel. Case was discussed with Dr. Lovell SheehanJenkins on call for general surgery who felt that with her multiple medical problems and poor functional baseline, she would be a poor candidate for any surgical intervention. Case was further discussed with the patient's sister who is her power of attorney who confirmed the patient is a DO NOT RESUSCITATE and agreed to pursue comfort measures. She is being admitted for further treatment.  Review of Systems: unable to assess due to mental status  Past Medical History:  Diagnosis Date  . Acidosis   . Aphasia   . Aphasia   . Chronic pain syndrome 12/01/2011  . Contracture of ankle and foot joint   . Dehydration   . Depression   . Dysphagia   . Dysphagia 12/01/2011  . Esophageal reflux   . GERD (gastroesophageal reflux disease)   . Hemiparesis, left (HCC)    . Hypertension   . Hypothyroid   . Lack of coordination   . Multiple sclerosis (HCC)   . Muscle weakness   . Obesity   . Pneumonia   . Seizures (HCC)   . 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 Stroke (HCC)   . Thrombocytopenia (HCC)     Past Surgical History:  Procedure Laterality Date  . ESOPHAGOGASTRODUODENOSCOPY N/A 05/25/2015   Procedure: ESOPHAGOGASTRODUODENOSCOPY (EGD);  Surgeon: West BaliSandi L Fields, MD;  Location: [facility];  Service: Endoscopy;  Laterality: N/A;  1430  . PEG PLACEMENT N/A 05/25/2015   Procedure: PERCUTANEOUS ENDOSCOPIC GASTROSTOMY (PEG) REPLACEMENT;  Surgeon: West BaliSandi L Fields, MD;  Location: [facility];  Service: Endoscopy;  Laterality: N/A;  . PEG PLACEMENT N/A 09/10/2015   Procedure: PERCUTANEOUS ENDOSCOPIC GASTROSTOMY (PEG) REPLACEMENT;  Surgeon: West BaliSandi L Fields, MD;  Location: [facility];  Service: Endoscopy;  Laterality: N/A;  1000      reports that she has never smoked. She does not have any smokeless tobacco history on file. She reports that she does not drink alcohol or use drugs.  No Known Allergies  Family History: unable to assess due to mental status  Prior to Admission medications   Medication Sig Start Date End Date Taking? Authorizing Provider  acetaminophen (TYLENOL) 650 MG CR tablet 650 mg by Enteral route daily as needed for pain.   Yes  Historical Provider, MD  albuterol (PROVENTIL) (2.5 MG/3ML) 0.083% nebulizer solution Take 2.5 mg by nebulization every 4 (four) hours as needed for wheezing or shortness of breath.   Yes Historical Provider, MD  Amino Acids-Protein Hydrolys (FEEDING SUPPLEMENT, PRO-STAT SUGAR FREE 64,) LIQD 30 mLs by Enteral route 2 (two) times daily.   Yes Historical Provider, MD  BIOTIN PO 5,000 mcg by Enteral route daily.    Yes Historical Provider, MD  collagenase (SANTYL) ointment  Apply 1 application topically daily.   Yes Historical Provider, MD  diazepam (VALIUM) 5 MG tablet 5 mg every 6 (six) hours as needed for anxiety. Via Peg-tube   Yes Historical Provider, MD  fentaNYL (DURAGESIC - DOSED MCG/HR) 12 MCG/HR Place 12.5 mcg onto the skin every 3 (three) days.   Yes Historical Provider, MD  insulin aspart (NOVOLOG) 100 UNIT/ML injection Sliding-scale NovoLog 2 times a day to 3 times a day: CBG 70-120 no units. CBG 151-200 give 2 units. CBG 201-250 give 3 units. CBG 251-300 give 6 units. CBG 301-350 give 8 units. CBG 351-400 give 10 units. CBG greater than 400 give 12 units and call M.D. Patient taking differently: Inject 2-4 Units into the skin 3 (three) times daily with meals. Sliding-scale NovoLog 2 times a day to 3 times a day: CBG 70-120 no units. CBG 151-200 give 2 units. CBG 201-250 give 3 units. CBG 251-300 give 6 units. CBG 301-350 give 8 units. CBG 351-400 give 10 units. CBG greater than 400 give 12 units and call M.D. 03/09/15  Yes Elliot Cousin, MD  interferon beta-1a (AVONEX) 30 MCG/0.5ML injection Inject 30 mcg into the muscle every 7 (seven) days. On Tuesday   Yes Historical Provider, MD  lacosamide 100 MG TABS Place 1 tablet (100 mg total) into feeding tube 2 (two) times daily. Patient taking differently: 100 mg by Enteral route 2 (two) times daily.  03/09/15  Yes Elliot Cousin, MD  levETIRAcetam (KEPPRA) 500 MG tablet Take 1 tablet (500 mg total) by mouth 2 (two) times daily. Patient taking differently: 500 mg by Enteral route 2 (two) times daily.  03/09/15  Yes Elliot Cousin, MD  levothyroxine (SYNTHROID, LEVOTHROID) 137 MCG tablet 137 mcg by Enteral route daily before breakfast.    Yes Historical Provider, MD  LORazepam (ATIVAN) 2 MG/ML concentrated solution Place 0.5 mLs (1 mg total) into feeding tube every 4 (four) hours as needed for seizure. Sign or symptoms of seizure activity 04/15/15  Yes Rhetta Mura, MD  oxyCODONE (ROXICODONE) 5 MG/5ML solution  Take 5 mLs (5 mg total) by mouth every 8 (eight) hours. 04/15/15  Yes Rhetta Mura, MD  pantoprazole (PROTONIX) 40 MG tablet 40 mg by Enteral route daily.   Yes Historical Provider, MD  senna (SENOKOT) 8.6 MG tablet 1 tablet by Enteral route 2 (two) times daily.    Yes Historical Provider, MD    Physical Exam: Vitals:   01/01/16 1300 01/01/16 1400 01/01/16 1402 01/01/16 1501  BP: 106/76 102/72  111/75  Pulse: (!) 133     Resp: (!) 50 (!) 40  (!) 32  Temp:    102.2 F (39 C)  TempSrc:    Rectal  SpO2: 100%  100% 100%  Weight:      Height:          Constitutional: NAD, calm, comfortable Vitals:   01/01/16 1300 01/01/16 1400 01/01/16 1402 01/01/16 1501  BP: 106/76 102/72  111/75  Pulse: (!) 133     Resp: (!) 50 (!) 40  (!)  32  Temp:    102.2 F (39 C)  TempSrc:    Rectal  SpO2: 100%  100% 100%  Weight:      Height:       Eyes: PERRL, lids and conjunctivae normal ENMT: Mucous membranes are moist. Posterior pharynx clear of any exudate or lesions.Normal dentition.  Neck: normal, supple, no masses, no thyromegaly Respiratory: clear to auscultation bilaterally, no wheezing, no crackles. Normal respiratory effort. No accessory muscle use.  Cardiovascular: tachycardic, no murmurs / rubs / gallops. No extremity edema. 2+ pedal pulses. No carotid bruits.  Abdomen: diffusely tender and rigid, no masses palpated. No hepatosplenomegaly. Bowel sounds positive. PEG tube in place with surrounding erythema Musculoskeletal: no clubbing / cyanosis. No joint deformity upper and lower extremities. Good ROM, no contractures. Normal muscle tone.  Skin: no rashes, lesions, ulcers. No induration Neurologic: does not participate in exam Psychiatric: nonverbal    Labs on Admission: I have personally reviewed following labs and imaging studies  CBC:  Recent Labs Lab 01/01/16 1155  WBC 10.5  NEUTROABS 9.4*  HGB 13.8  HCT 44.2  MCV 73.1*  PLT 362   Basic Metabolic Panel:  Recent  Labs Lab 01/01/16 1155  NA 135  K 5.0  CL 98*  CO2 18*  GLUCOSE 189*  BUN 72*  CREATININE 2.29*  CALCIUM 9.8   GFR: Estimated Creatinine Clearance: 23.8 mL/min (by C-G formula based on SCr of 2.29 mg/dL (H)). Liver Function Tests:  Recent Labs Lab 01/01/16 1155  AST 50*  ALT 20  ALKPHOS 113  BILITOT 0.9  PROT 9.4*  ALBUMIN 3.3*   No results for input(s): LIPASE, AMYLASE in the last 168 hours. No results for input(s): AMMONIA in the last 168 hours. Coagulation Profile: No results for input(s): INR, PROTIME in the last 168 hours. Cardiac Enzymes: No results for input(s): CKTOTAL, CKMB, CKMBINDEX, TROPONINI in the last 168 hours. BNP (last 3 results) No results for input(s): PROBNP in the last 8760 hours. HbA1C: No results for input(s): HGBA1C in the last 72 hours. CBG: No results for input(s): GLUCAP in the last 168 hours. Lipid Profile: No results for input(s): CHOL, HDL, LDLCALC, TRIG, CHOLHDL, LDLDIRECT in the last 72 hours. Thyroid Function Tests: No results for input(s): TSH, T4TOTAL, FREET4, T3FREE, THYROIDAB in the last 72 hours. Anemia Panel: No results for input(s): VITAMINB12, FOLATE, FERRITIN, TIBC, IRON, RETICCTPCT in the last 72 hours. Urine analysis:    Component Value Date/Time   COLORURINE YELLOW 01/01/2016 1225   APPEARANCEUR TURBID (A) 01/01/2016 1225   LABSPEC 1.020 01/01/2016 1225   PHURINE 7.5 01/01/2016 1225   GLUCOSEU NEGATIVE 01/01/2016 1225   HGBUR LARGE (A) 01/01/2016 1225   BILIRUBINUR NEGATIVE 01/01/2016 1225   KETONESUR NEGATIVE 01/01/2016 1225   PROTEINUR >300 (A) 01/01/2016 1225   UROBILINOGEN 1.0 05/21/2014 0244   NITRITE NEGATIVE 01/01/2016 1225   LEUKOCYTESUR LARGE (A) 01/01/2016 1225   Sepsis Labs: !!!!!!!!!!!!!!!!!!!!!!!!!!!!!!!!!!!!!!!!!!!! @LABRCNTIP (procalcitonin:4,lacticidven:4) ) Recent Results (from the past 240 hour(s))  Blood Culture (routine x 2)     Status: None (Preliminary result)   Collection Time:  01/01/16 11:55 AM  Result Value Ref Range Status   Specimen Description BLOOD RIGHT ANTECUBITAL  Final   Special Requests BOTTLES DRAWN AEROBIC AND ANAEROBIC 6CC  Final   Culture PENDING  Incomplete   Report Status PENDING  Incomplete  Blood Culture (routine x 2)     Status: None (Preliminary result)   Collection Time: 01/01/16 11:58 AM  Result Value Ref Range  Status   Specimen Description BLOOD RIGHT WRIST  Final   Special Requests BOTTLES DRAWN AEROBIC AND ANAEROBIC [facility]  Final   Culture PENDING  Incomplete   Report Status PENDING  Incomplete     Radiological Exams on Admission: Dg Chest Portable 1 View  Result Date: 01/01/2016 CLINICAL DATA:  Shortness of breath and tachypnea. Fever. Sepsis. Possible aspiration. EXAM: PORTABLE CHEST 1 VIEW COMPARISON:  04/15/2015 FINDINGS: Low lung volumes are noted. Asymmetric airspace opacity is seen in the right mid and lower lung fields, suspicious for pneumonia. Left lung is clear. No evidence of pneumothorax or pleural effusion. Heart size is within normal limits. IMPRESSION: Asymmetric airspace disease in right mid and lower lung fields, suspicious for pneumonia. Electronically Signed   By: Myles Rosenthal M.D.   On: 01/01/2016 12:35   Ct Renal Stone Study  Result Date: 01/01/2016 CLINICAL DATA:  Abdominal guarding, gradually worsening sob, tachypnea, fever. Hx of stroke, peg, MS,HTN, GERD. EXAM: CT ABDOMEN AND PELVIS WITHOUT CONTRAST TECHNIQUE: Multidetector CT imaging of the abdomen and pelvis was performed following the standard protocol without IV contrast. COMPARISON:  None. FINDINGS: Lower chest: There is consolidation in the right lower lobe. Although this may be all atelectasis, pneumonia is suspected. Milder opacity in the left lung base is consistent with atelectasis. Heart is normal in size. Hepatobiliary: Portal venous air is seen throughout portal branches in the left lobe. No liver mass or focal lesion. There are prominent gallstones. No  convincing gallbladder wall thickening. No bile duct dilation. Pancreas: Unremarkable. No pancreatic ductal dilatation or surrounding inflammatory changes. Spleen: Normal in size without focal abnormality. Adrenals/Urinary Tract: No adrenal mass. There is marked right and moderate left hydronephrosis and hydroureter. Ureters are distended all the way to the bladder. No ureteral stones. Bladder is partly decompressed by Foley catheter. No convincing bladder mass. Balloon of the catheter lies directly adjacent to the luminal side of the ureterovesicular junctions. No renal masses or stones. Stomach/Bowel: Pneumatosis intestinalis is seen along the ileum would air extending into portal venous branches colon is mild-to-moderately distended most evident along the right colon. The ascending colon measures 6 cm in diameter. There is increase colonic and rectal stool. No colon pneumatosis intestinalis is evident. No colonic wall thickening. A percutaneously placed gastrostomy tube has its tip in the distal stomach. Vascular/Lymphatic: Mild atherosclerotic calcifications noted along a normal caliber abdominal aorta. No discrete enlarged lymph nodes. Reproductive: Status post hysterectomy. No adnexal masses. Other: Trace amount of ascites noted along the pericolic gutters and in the posterior pelvic recess. No abdominal hernia. Musculoskeletal: Compression fracture of L4, most likely old. There is sclerosis along the visualized right ischium, as well as the lower sacrum, which appears to be the result of previous osteomyelitis. A soft tissue defect consistent with a decubitus ulcer overlies the lower sacrum and coccyx. There is a defect along the posterior margin of the right ilium also consistent with chronic osteomyelitis. IMPRESSION: 1. 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 This is concerning for ischemic bowel although there is no significant small bowel wall thickening. 2. Right lower lobe  consolidation consistent with pneumonia. 3. Marked right and moderate left hydronephrosis and hydroureter. No ureteral stone. No bladder mass. The Foley catheter balloon does appear to compress the ureterovesicular junctions and may be the cause of hydronephrosis. 4. Trace amount of ascites. 5. Moderate increased stool in the colon and rectum. 6. Gallstones.  No convincing acute cholecystitis. 7. Compression fracture of L4, most likely  old. 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 Electronically Signed   By: Amie Portland M.D.   On: 01/01/2016 13:44    Assessment/Plan Active Problems:   Multiple sclerosis (HCC)   Hypothyroid   Aphasia   HCAP (healthcare-associated pneumonia)   Seizure disorder (HCC)   Status post insertion of percutaneous endoscopic gastrostomy (PEG) tube (HCC)   DNR (do not resuscitate)   Acute renal failure (ARF) (HCC)   Sepsis (HCC)   Ischemic bowel disease (HCC)   Bilateral hydronephrosis   This is a 65 year old female with multiple medical problems including previous stroke, multiple sclerosis, poor functional capacity who is essentially bedbound, PEG tube and resident of skilled nursing facility. She presents to the hospital with fever and shortness of breath. Workup showed evidence of pneumonia and ischemic bowel. She also has bilateral hydronephrosis and acute renal failure. Lactic acid is markedly elevated at 8.9. Her prognosis is very poor. Case was reviewed with Dr. Lovell Sheehan who feels that palliative care would be the most appropriate direction of care. This was discussed with her sisters who is her power of attorney. She confirms patient is a DO NOT RESUSCITATE and agrees to comfort measures. Will admit the patient and start on intermittent morphine for pain management. Continue other supportive measures and consult palliative care for end-of-life care. Anticipate possible in-hospital death.   DVT prophylaxis: No DVT prophylaxis. Patient  is comfort care Code Status: DO NOT RESUSCITATE, comfort measures only Family Communication: Discussed with sister [name] who is healthcare power of attorney over the phone Disposition Plan: Possible in-hospital death Consults called:  Admission status: Medical bed   Indio Santilli MD [facility] Pager [phone]  If 7PM-7AM, please contact night-coverage [email or web address] Password [email or web address]  01/01/2016, 3:54 PM

## 2016-01-01 NOTE — ED Notes (Signed)
[facility] called and gave report on patient and stated "foley was changed yesterday (12/31/15) and that pt bowel sounds were noted to be diminished, Increased fluid on bilateral lungs, was noted on am assessment." Primary RN aware.

## 2016-01-01 NOTE — ED Notes (Signed)
Delay in obtaining repeat lactic acid due to poor venous access.

## 2016-01-02 DIAGNOSIS — L8990 Pressure ulcer of unspecified site, unspecified stage: Secondary | ICD-10-CM | POA: Insufficient documentation

## 2016-01-02 LAB — BLOOD CULTURE ID PANEL (REFLEXED)
ACINETOBACTER BAUMANNII: NOT DETECTED
CANDIDA KRUSEI: NOT DETECTED
Candida albicans: NOT DETECTED
Candida glabrata: NOT DETECTED
Candida parapsilosis: NOT DETECTED
Candida tropicalis: NOT DETECTED
ENTEROCOCCUS SPECIES: NOT DETECTED
ESCHERICHIA COLI: NOT DETECTED
Enterobacter cloacae complex: NOT DETECTED
Enterobacteriaceae species: DETECTED — AB
HAEMOPHILUS INFLUENZAE: NOT DETECTED
Klebsiella oxytoca: NOT DETECTED
Klebsiella pneumoniae: DETECTED — AB
LISTERIA MONOCYTOGENES: NOT DETECTED
METHICILLIN RESISTANCE: NOT DETECTED
NEISSERIA MENINGITIDIS: NOT DETECTED
PROTEUS SPECIES: DETECTED — AB
PSEUDOMONAS AERUGINOSA: NOT DETECTED
SERRATIA MARCESCENS: NOT DETECTED
STAPHYLOCOCCUS AUREUS BCID: NOT DETECTED
STAPHYLOCOCCUS SPECIES: NOT DETECTED
STREPTOCOCCUS AGALACTIAE: NOT DETECTED
STREPTOCOCCUS PYOGENES: NOT DETECTED
STREPTOCOCCUS SPECIES: NOT DETECTED
Streptococcus pneumoniae: NOT DETECTED

## 2016-01-02 LAB — URINE CULTURE

## 2016-01-02 NOTE — Progress Notes (Signed)
PROGRESS NOTE    [name]  VEL:[id number] DOB: 1950/04/14 DOA: 01/01/2016 PCP: Colon Branch, MD    Brief Narrative:  2 yof with a hx of  previous stroke, MS who is essentially bedbound, nonverbal, and has a PEG tube presented with complaints of a shortness of breath and a fever. While in the ED, she was noted to be febrile and tachycardic. Chest x-ray indicated evidence of pneumonia. She was also noted to have an elevated lactic acid of 8.9. CT of abdomen revealed possible ischemic bowel. Patient was admitted for further evaluation.   Assessment & Plan:   Active Problems:   Multiple sclerosis (HCC)   Hypothyroid   Aphasia   HCAP (healthcare-associated pneumonia)   Seizure disorder (HCC)   Status post insertion of percutaneous endoscopic gastrostomy (PEG) tube (HCC)   DNR (do not resuscitate)   Acute renal failure (ARF) (HCC)   Sepsis (HCC)   Ischemic bowel disease (HCC)   Bilateral hydronephrosis  1. Patient with multiple medical problems including gram negative sepsis, ischemic bowel, HCAP, Acute renal failure. Overall prognosis is very poor. Discussed with family and they have elected to pursue comfort measures. Will continue pain management with morphine. Ativan for agitation/seizures. Palliative care consulted. Anticipate in hospital death  DVT prophylaxis: none Code Status: DNR, comfort measures only  Family Communication: No family at bedside Disposition Plan: possible in hospital death   Consultants:   None   Procedures:   None   Antimicrobials:      Subjective: nonverbal  Objective: Vitals:   01/01/16 1600 01/01/16 1601 01/01/16 1743 01/01/16 2302  BP: 114/74  101/63 (!) 80/48  Pulse:  (!) 140 96 (!) 155  Resp: (!) 34 (!) 34 (!) 33 (!) 38  Temp:   98.5 F (36.9 C) 98.6 F (37 C)  TempSrc:    Axillary  SpO2:  98% 98% 98%  Weight:   68 kg (150 lb)   Height:   5\' 7"  (1.702 m)     Intake/Output Summary (Last 24 hours) at 01/02/16 0717 Last  data filed at 01/01/16 1555  Gross per 24 hour  Intake             2800 ml  Output                0 ml  Net             2800 ml   Filed Weights   01/01/16 1139 01/01/16 1743  Weight: 68 kg (150 lb) 68 kg (150 lb)    Examination:  General exam: short shallow breaths Respiratory system: Clear to auscultation. Respiratory effort normal.     Data Reviewed: I have personally reviewed following labs and imaging studies  CBC:  Recent Labs Lab 01/01/16 1155  WBC 10.5  NEUTROABS 9.4*  HGB 13.8  HCT 44.2  MCV 73.1*  PLT 362   Basic Metabolic Panel:  Recent Labs Lab 01/01/16 1155  NA 135  K 5.0  CL 98*  CO2 18*  GLUCOSE 189*  BUN 72*  CREATININE 2.29*  CALCIUM 9.8   GFR: Estimated Creatinine Clearance: 23.8 mL/min (by C-G formula based on SCr of 2.29 mg/dL (H)). Liver Function Tests:  Recent Labs Lab 01/01/16 1155  AST 50*  ALT 20  ALKPHOS 113  BILITOT 0.9  PROT 9.4*  ALBUMIN 3.3*   No results for input(s): LIPASE, AMYLASE in the last 168 hours. No results for input(s): AMMONIA in the last 168 hours. Coagulation Profile: No  results for input(s): INR, PROTIME in the last 168 hours. Cardiac Enzymes: No results for input(s): CKTOTAL, CKMB, CKMBINDEX, TROPONINI in the last 168 hours. BNP (last 3 results) No results for input(s): PROBNP in the last 8760 hours. HbA1C: No results for input(s): HGBA1C in the last 72 hours. CBG: No results for input(s): GLUCAP in the last 168 hours. Lipid Profile: No results for input(s): CHOL, HDL, LDLCALC, TRIG, CHOLHDL, LDLDIRECT in the last 72 hours. Thyroid Function Tests: No results for input(s): TSH, T4TOTAL, FREET4, T3FREE, THYROIDAB in the last 72 hours. Anemia Panel: No results for input(s): VITAMINB12, FOLATE, FERRITIN, TIBC, IRON, RETICCTPCT in the last 72 hours. Sepsis Labs:  Recent Labs Lab 01/01/16 1205 01/01/16 1557  LATICACIDVEN 8.90* 9.21*    Recent Results (from the past 240 hour(s))  Blood  Culture (routine x 2)     Status: None (Preliminary result)   Collection Time: 01/01/16 11:55 AM  Result Value Ref Range Status   Specimen Description BLOOD RIGHT ANTECUBITAL  Final   Special Requests BOTTLES DRAWN AEROBIC AND ANAEROBIC 6CC  Final   Culture NO GROWTH < 24 HOURS  Final   Report Status PENDING  Incomplete  Blood Culture (routine x 2)     Status: None (Preliminary result)   Collection Time: 01/01/16 11:58 AM  Result Value Ref Range Status   Specimen Description BLOOD RIGHT WRIST  Final   Special Requests BOTTLES DRAWN AEROBIC AND ANAEROBIC 6CC  Final   Culture NO GROWTH < 24 HOURS  Final   Report Status PENDING  Incomplete  MRSA PCR Screening     Status: None   Collection Time: 01/01/16  6:03 PM  Result Value Ref Range Status   MRSA by PCR NEGATIVE NEGATIVE Final    Comment:        The GeneXpert MRSA Assay (FDA approved for NASAL specimens only), is one component of a comprehensive MRSA colonization surveillance program. It is not intended to diagnose MRSA infection nor to guide or monitor treatment for MRSA infections.          Radiology Studies: Dg Chest Portable 1 View  Result Date: 01/01/2016 CLINICAL DATA:  Shortness of breath and tachypnea. Fever. Sepsis. Possible aspiration. EXAM: PORTABLE CHEST 1 VIEW COMPARISON:  04/15/2015 FINDINGS: Low lung volumes are noted. Asymmetric airspace opacity is seen in the right mid and lower lung fields, suspicious for pneumonia. Left lung is clear. No evidence of pneumothorax or pleural effusion. Heart size is within normal limits. IMPRESSION: Asymmetric airspace disease in right mid and lower lung fields, suspicious for pneumonia. Electronically Signed   By: Myles RosenthalJohn  Stahl M.D.   On: 01/01/2016 12:35   Ct Renal Stone Study  Result Date: 01/01/2016 CLINICAL DATA:  Abdominal guarding, gradually worsening sob, tachypnea, fever. Hx of stroke, peg, MS,HTN, GERD. EXAM: CT ABDOMEN AND PELVIS WITHOUT CONTRAST TECHNIQUE:  Multidetector CT imaging of the abdomen and pelvis was performed following the standard protocol without IV contrast. COMPARISON:  None. FINDINGS: Lower chest: There is consolidation in the right lower lobe. Although this may be all atelectasis, pneumonia is suspected. Milder opacity in the left lung base is consistent with atelectasis. Heart is normal in size. Hepatobiliary: Portal venous air is seen throughout portal branches in the left lobe. No liver mass or focal lesion. There are prominent gallstones. No convincing gallbladder wall thickening. No bile duct dilation. Pancreas: Unremarkable. No pancreatic ductal dilatation or surrounding inflammatory changes. Spleen: Normal in size without focal abnormality. Adrenals/Urinary Tract: No adrenal mass. There  is marked right and moderate left hydronephrosis and hydroureter. Ureters are distended all the way to the bladder. No ureteral stones. Bladder is partly decompressed by Foley catheter. No convincing bladder mass. Balloon of the catheter lies directly adjacent to the luminal side of the ureterovesicular junctions. No renal masses or stones. Stomach/Bowel: Pneumatosis intestinalis is seen along the ileum would air extending into portal venous branches colon is mild-to-moderately distended most evident along the right colon. The ascending colon measures 6 cm in diameter. There is increase colonic and rectal stool. No colon pneumatosis intestinalis is evident. No colonic wall thickening. A percutaneously placed gastrostomy tube has its tip in the distal stomach. Vascular/Lymphatic: Mild atherosclerotic calcifications noted along a normal caliber abdominal aorta. No discrete enlarged lymph nodes. Reproductive: Status post hysterectomy. No adnexal masses. Other: Trace amount of ascites noted along the pericolic gutters and in the posterior pelvic recess. No abdominal hernia. Musculoskeletal: Compression fracture of L4, most likely old. There is sclerosis along the  visualized right ischium, as well as the lower sacrum, which appears to be the result of previous osteomyelitis. A soft tissue defect consistent with a decubitus ulcer overlies the lower sacrum and coccyx. There is a defect along the posterior margin of the right ilium also consistent with chronic osteomyelitis. IMPRESSION: 1. 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 This is concerning for ischemic bowel although there is no significant small bowel wall thickening. 2. Right lower lobe consolidation consistent with pneumonia. 3. Marked right and moderate left hydronephrosis and hydroureter. No ureteral stone. No bladder mass. The Foley catheter balloon does appear to compress the ureterovesicular junctions and may be the cause of hydronephrosis. 4. Trace amount of ascites. 5. Moderate increased stool in the colon and rectum. 6. Gallstones.  No convincing acute cholecystitis. 7. Compression fracture of L4, most likely old. 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 Electronically Signed   By: Amie Portland M.D.   On: 01/01/2016 13:44        Scheduled Meds: . acetaminophen  650 mg Rectal Once  . Influenza vac split quadrivalent PF  0.5 mL Intramuscular Tomorrow-1000  . lacosamide  100 mg Oral BID  . levETIRAcetam  500 mg Oral BID  . pneumococcal 23 valent vaccine  0.5 mL Intramuscular Tomorrow-1000  . sodium chloride flush  3 mL Intravenous Q12H   Continuous Infusions:    LOS: 1 day    Time spent: 15 minutes     Erick Blinks, MD [facility] if 7PM-7AM, please contact night-coverage [email or web address] Password [email or web address] 01/02/2016, 7:17 AM

## 2016-01-03 ENCOUNTER — Encounter (HOSPITAL_COMMUNITY): Payer: Self-pay | Admitting: Primary Care

## 2016-01-03 DIAGNOSIS — Z515 Encounter for palliative care: Secondary | ICD-10-CM

## 2016-01-03 DIAGNOSIS — Z7189 Other specified counseling: Secondary | ICD-10-CM

## 2016-01-03 MED ORDER — SODIUM CHLORIDE 0.9 % IV SOLN
5.0000 mg/h | INTRAVENOUS | Status: DC
  Administered 2016-01-03: 5 mg/h via INTRAVENOUS
  Filled 2016-01-03: qty 10

## 2016-01-03 NOTE — Progress Notes (Signed)
Upon assessment this am,patient very Tachypenic,respiratory rate 44,vital signs taken,and documented in Flowsheets time 8:13am. Dr Kerry Hough notified.Will continue to monitor patient.

## 2016-01-03 NOTE — Clinical Social Work Note (Signed)
CSW received referral for placement. Pt is now comfort care and in hospital death expected.   Derenda Fennel, LCSW [phone]

## 2016-01-03 NOTE — Consult Note (Signed)
Consultation Note Date: 01/03/2016   Patient Name: [name]  DOB: 1951-01-13  MRN: [id number]  Age / Sex: 65 y.o., female  PCP: Colon Branch, MD Referring Physician: Erick Blinks, MD  Reason for Consultation: Establishing goals of care, Inpatient hospice referral and Psychosocial/spiritual support  HPI/Patient Profile: 65 y.o. female  with past medical history of multiple sclerosis, SNF resident since 1996, stroke with left hemiparesis, Genella Rife, dysphagia, depression, aphasia, hypertension, history of pneumonia, stage III pressure ulcer of sacrum, and history of peg tube admitted on 01/01/2016 with ischemic bowel, no candidate for surgical intervention.   Clinical Assessment and Goals of Care: [name] is resting quietly in bed. She tends to make eye contact with me when I speak to her, and nods affirmatively when I ask if she is having pain. Present today at bedside is her sister Narcissus Macdermott from [place]. [name] and I leave the room to have a private conversation. [name] shares that [name] was diagnosed with multiple sclerosis in her mid-20s. 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 [name] got a bachelors degree in sociology/biology from [employer]. She was going to work in [place] with a cousin, and was unable to step up on a bus. She returned to [facility], was tested at [facility], and found to have multiple sclerosis. She lived with her mother and stepfather for many years, but, has been in a skilled nursing facility since around 1996.  [name] talks about [name]' hospitalization 1 year ago. She shares that the physicians expected her sister to pass away, but she did not. [name] talks about her decision for peg tube placement, and transfer to [facility]. She states that, until recently, Ms. Kolbe seem to still have strength left in her body. [name] goes on  to share that she has seen changes in [name] body. That she has "fought the good fight".  [name] and I talk about her current concern related to ischemic bowel. I share that we expect pain, but are controlling this with morphine. I share that when people near end of life what we see; sleeping more, interacting less. We talk about transfer to the hospice home of [place] if [name] is still with [place] tomorrow. [name] states that she feels transition to hospice home of [facility] would be a good choice for her sister. [name] is tearful at times, sharing that she has told Ms. Theard that is okay to take God's hand and leave, that she will see her parents and grandparents in heaven.  Healthcare power of attorney HCPOA - sister Lateefa Oeser is listed as primary healthcare power of attorney in document list dated 12/04/11.   SUMMARY OF RECOMMENDATIONS   comfort measures only at this time. Considering transfer to hospice home if [name] does not pass in the next 24 hours.  Code Status/Advance Care Planning:  DNR  Symptom Management:   morphine continuous infusion per hospitalist  Palliative Prophylaxis:   Aspiration, Frequent Pain Assessment, Oral  Care, Palliative Wound Care and Turn Reposition  Additional Recommendations (Limitations, Scope, Preferences):  Full Comfort Care  Psycho-social/Spiritual:   Desire for further Chaplaincy support:yes  Additional Recommendations: Caregiving  Support/Resources and Education on Hospice  Prognosis:   < 2 weeks, likely based on ischemic bowel without surgical intervention.  Discharge Planning: In-hospital death versus transition to hospice home of [facility] in the next 24 hours.      Primary Diagnoses: Present on Admission: . Acute renal failure (ARF) (HCC) . Multiple sclerosis (HCC) . HCAP (healthcare-associated pneumonia) . DNR (do not resuscitate) . Aphasia . Hypothyroid . Sepsis (HCC) . Ischemic  bowel disease (HCC) . Bilateral hydronephrosis   I have reviewed the medical record, interviewed the patient and family, and examined the patient. The following aspects are pertinent.  Past Medical History:  Diagnosis Date  . Acidosis   . Aphasia   . Aphasia   . Chronic pain syndrome 12/01/2011  . Contracture of ankle and foot joint   . Dehydration   . Depression   . Dysphagia   . Dysphagia 12/01/2011  . Esophageal reflux   . GERD (gastroesophageal reflux disease)   . Hemiparesis, left (HCC)   . Hypertension   . Hypothyroid   . Lack of coordination   . Multiple sclerosis (HCC)   . Muscle weakness   . Obesity   . Pneumonia   . Seizures (HCC)   . 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 Status post insertion of percutaneous endoscopic gastrostomy (PEG) tube (HCC) 03/01/2015   at [facility]  . Stroke (HCC)   . Thrombocytopenia [facility])    Social History   Social History  . Marital status: Single    Spouse name: N/A  . Number of children: N/A  . Years of education: N/A   Social History Main Topics  . Smoking status: Former Smoker    Packs/day: 1.00    Years: 4.00  . Smokeless tobacco: Former Neurosurgeon    Quit date: 08/30/1975  . Alcohol use No  . Drug use: No  . Sexual activity: No   Other Topics Concern  . None   Social History Narrative  . None   History reviewed. No pertinent family history. Scheduled Meds: . acetaminophen  650 mg Rectal Once  . Influenza vac split quadrivalent PF  0.5 mL Intramuscular Tomorrow-1000  . lacosamide  100 mg Oral BID  . levETIRAcetam  500 mg Oral BID  . pneumococcal 23 valent vaccine  0.5 mL Intramuscular Tomorrow-1000  . sodium chloride flush  3 mL Intravenous Q12H   Continuous Infusions: . morphine 5 mg/hr (01/03/16 1010)   PRN Meds:.sodium chloride, acetaminophen **OR** acetaminophen, albuterol, antiseptic oral rinse, glycopyrrolate **OR** glycopyrrolate **OR** glycopyrrolate, haloperidol **OR**  [DISCONTINUED] haloperidol **OR** haloperidol lactate, LORazepam, ondansetron **OR** ondansetron (ZOFRAN) IV, polyvinyl alcohol, sodium chloride flush Medications Prior to Admission:  Prior to Admission medications   Medication Sig Start Date End Date Taking? Authorizing Provider  acetaminophen (TYLENOL) 650 MG CR tablet 650 mg by Enteral route daily as needed for pain.   Yes Historical Provider, MD  albuterol (PROVENTIL) (2.5 MG/3ML) 0.083% nebulizer solution Take 2.5 mg by nebulization every 4 (four) hours as needed for wheezing or shortness of breath.   Yes Historical Provider, MD  Amino Acids-Protein Hydrolys (FEEDING SUPPLEMENT, PRO-STAT SUGAR FREE 64,) LIQD 30 mLs by Enteral route 2 (two) times daily.   Yes Historical Provider, MD  BIOTIN PO 5,000 mcg by Enteral route daily.  Yes Historical Provider, MD  collagenase (SANTYL) ointment Apply 1 application topically daily.   Yes Historical Provider, MD  diazepam (VALIUM) 5 MG tablet 5 mg every 6 (six) hours as needed for anxiety. Via Peg-tube   Yes Historical Provider, MD  fentaNYL (DURAGESIC - DOSED MCG/HR) 12 MCG/HR Place 12.5 mcg onto the skin every 3 (three) days.   Yes Historical Provider, MD  insulin aspart (NOVOLOG) 100 UNIT/ML injection Sliding-scale NovoLog 2 times a day to 3 times a day: CBG 70-120 no units. CBG 151-200 give 2 units. CBG 201-250 give 3 units. CBG 251-300 give 6 units. CBG 301-350 give 8 units. CBG 351-400 give 10 units. CBG greater than 400 give 12 units and call M.D. Patient taking differently: Inject 2-4 Units into the skin 3 (three) times daily with meals. Sliding-scale NovoLog 2 times a day to 3 times a day: CBG 70-120 no units. CBG 151-200 give 2 units. CBG 201-250 give 3 units. CBG 251-300 give 6 units. CBG 301-350 give 8 units. CBG 351-400 give 10 units. CBG greater than 400 give 12 units and call M.D. 03/09/15  Yes Elliot Cousin, MD  interferon beta-1a (AVONEX) 30 MCG/0.5ML injection Inject 30 mcg into the  muscle every 7 (seven) days. On Tuesday   Yes Historical Provider, MD  lacosamide 100 MG TABS Place 1 tablet (100 mg total) into feeding tube 2 (two) times daily. Patient taking differently: 100 mg by Enteral route 2 (two) times daily.  03/09/15  Yes Elliot Cousin, MD  levETIRAcetam (KEPPRA) 500 MG tablet Take 1 tablet (500 mg total) by mouth 2 (two) times daily. Patient taking differently: 500 mg by Enteral route 2 (two) times daily.  03/09/15  Yes Elliot Cousin, MD  levothyroxine (SYNTHROID, LEVOTHROID) 137 MCG tablet 137 mcg by Enteral route daily before breakfast.    Yes Historical Provider, MD  LORazepam (ATIVAN) 2 MG/ML concentrated solution Place 0.5 mLs (1 mg total) into feeding tube every 4 (four) hours as needed for seizure. Sign or symptoms of seizure activity 04/15/15  Yes Rhetta Mura, MD  oxyCODONE (ROXICODONE) 5 MG/5ML solution Take 5 mLs (5 mg total) by mouth every 8 (eight) hours. 04/15/15  Yes Rhetta Mura, MD  pantoprazole (PROTONIX) 40 MG tablet 40 mg by Enteral route daily.   Yes Historical Provider, MD  senna (SENOKOT) 8.6 MG tablet 1 tablet by Enteral route 2 (two) times daily.    Yes Historical Provider, MD   No Known Allergies Review of Systems  Unable to perform ROS: Patient nonverbal    Physical Exam  Constitutional: No distress.  Chronically ill appearing, frail  HENT:  Head: Normocephalic and atraumatic.  Cardiovascular: Normal rate and regular rhythm.   Pulmonary/Chest: Effort normal. No respiratory distress.  Rate mid to high 20s  Abdominal: Soft. She exhibits no distension.  Neurological:  Eyes are open, tries to make eye contact, nonverbal, unable to determine orientation  Skin: Skin is warm and dry.  Nursing note and vitals reviewed.   Vital Signs: BP 122/74 (BP Location: Left Arm)   Pulse (!) 133   Temp 98.2 F (36.8 C) (Axillary)   Resp (!) 44   Ht 5\' 7"  (1.702 m)   Wt 68 kg (150 lb)   SpO2 97%   BMI 23.49 kg/m  Pain Assessment:  PAINAD       SpO2: SpO2: 97 % O2 Device:SpO2: 97 % O2 Flow Rate: .O2 Flow Rate (L/min): 3 L/min  IO: Intake/output summary:  Intake/Output Summary (Last 24 hours) at  01/03/16 1355 Last data filed at 01/03/16 0552  Gross per 24 hour  Intake                0 ml  Output              700 ml  Net             -700 ml    LBM:   Baseline Weight: Weight: 68 kg (150 lb) Most recent weight: Weight: 68 kg (150 lb)     Palliative Assessment/Data:   Flowsheet Rows   Flowsheet Row Most Recent Value  Intake Tab  Referral Department  Hospitalist  Unit at Time of Referral  Med/Surg Unit  Palliative Care Primary Diagnosis  Other (Comment) [ischemic bowel]  Date Notified  01/01/16  Palliative Care Type  Return patient Palliative Care  Reason for referral  Clarify Goals of Care, Advance Care Planning  Date of Admission  01/01/16  Date first seen by Palliative Care  01/03/16  # of days Palliative referral response time  2 Day(s)  # of days IP prior to Palliative referral  0  Clinical Assessment  Palliative Performance Scale Score  20%  Pain Max last 24 hours  Not able to report  Pain Min Last 24 hours  Not able to report  Dyspnea Max Last 24 Hours  Not able to report  Dyspnea Min Last 24 hours  Not able to report  Psychosocial & Spiritual Assessment  Palliative Care Outcomes  Patient/Family meeting held?  Yes  Who was at the meeting?  Sister/HC POA [name] Eiland [Sister/HC POA [name] Westendorf]  Palliative Care Outcomes  Provided advance care planning, Counseled regarding hospice, Provided psychosocial or spiritual support  Patient/Family wishes: Interventions discontinued/not started   Mechanical Ventilation  Palliative Care follow-up planned  -- [Follow-up while at [facility]]      Time In: 1135 Time Out: 1225 Time Total: 50 minutes Goals of care conversation shared with nursing staff, social worker, and Dr. Kerry HoughMemon.  Greater than 50%  of this time was spent counseling and coordinating  care related to the above assessment and plan.  Signed by: Katheran Aweasha A Dove, NP   Please contact Palliative Medicine Team phone at [phone] for questions and concerns.  For individual provider: See Loretha StaplerAmion

## 2016-01-03 NOTE — Progress Notes (Signed)
PROGRESS NOTE    [name]  YNW:[id number] DOB: 1950/09/25 DOA: 01/01/2016 PCP: Colon Branch, MD    Brief Narrative:  24 yof with a hx of  previous stroke, MS who is essentially bedbound, nonverbal, and has a PEG tube presented with complaints of a shortness of breath and a fever. While in the ED, she was noted to be febrile and tachycardic. Chest x-ray indicated evidence of pneumonia. She was also noted to have an elevated lactic acid of 8.9. CT of abdomen revealed possible ischemic bowel. Patient was admitted for further evaluation.   Assessment & Plan:   Active Problems:   Multiple sclerosis (HCC)   Hypothyroid   Aphasia   HCAP (healthcare-associated pneumonia)   Seizure disorder (HCC)   Status post insertion of percutaneous endoscopic gastrostomy (PEG) tube (HCC)   DNR (do not resuscitate)   Acute renal failure (ARF) (HCC)   Sepsis (HCC)   Ischemic bowel disease (HCC)   Bilateral hydronephrosis   Pressure injury of skin  1. Patient with multiple medical problems including gram negative sepsis, ischemic bowel, HCAP, Acute renal failure. Overall prognosis is very poor. Discussed with family and they have elected to pursue comfort measures. Since she is having respiratory distress, will start on morphine infusion that can be titrated for comfort. Ativan for agitation/seizures. Palliative care consulted. Anticipate in hospital death  DVT prophylaxis: none Code Status: DNR, comfort measures only  Family Communication: No family at bedside Disposition Plan: possible in hospital death   Consultants:   None   Procedures:   None   Antimicrobials:      Subjective: nonverbal  Objective: Vitals:   01/02/16 0720 01/02/16 2144 01/03/16 0600 01/03/16 0813  BP: 118/69 115/70 127/72 122/74  Pulse: (!) 159 (!) 147 (!) 137 (!) 133  Resp: (!) 41 (!) 56 (!) 48 (!) 44  Temp: 100.3 F (37.9 C) 99.6 F (37.6 C) 98.7 F (37.1 C) 98.2 F (36.8 C)  TempSrc: Axillary  Axillary Axillary Axillary  SpO2: 96% 97% 96% 97%  Weight:      Height:        Intake/Output Summary (Last 24 hours) at 01/03/16 1306 Last data filed at 01/03/16 0552  Gross per 24 hour  Intake                0 ml  Output              700 ml  Net             -700 ml   Filed Weights   01/01/16 1139 01/01/16 1743  Weight: 68 kg (150 lb) 68 kg (150 lb)    Examination:  General exam: short shallow breaths Respiratory system: Clear to auscultation. Increased respiratory effort     Data Reviewed: I have personally reviewed following labs and imaging studies  CBC:  Recent Labs Lab 01/01/16 1155  WBC 10.5  NEUTROABS 9.4*  HGB 13.8  HCT 44.2  MCV 73.1*  PLT 362   Basic Metabolic Panel:  Recent Labs Lab 01/01/16 1155  NA 135  K 5.0  CL 98*  CO2 18*  GLUCOSE 189*  BUN 72*  CREATININE 2.29*  CALCIUM 9.8   GFR: Estimated Creatinine Clearance: 23.8 mL/min (by C-G formula based on SCr of 2.29 mg/dL (H)). Liver Function Tests:  Recent Labs Lab 01/01/16 1155  AST 50*  ALT 20  ALKPHOS 113  BILITOT 0.9  PROT 9.4*  ALBUMIN 3.3*   No results for input(s): LIPASE,  AMYLASE in the last 168 hours. No results for input(s): AMMONIA in the last 168 hours. Coagulation Profile: No results for input(s): INR, PROTIME in the last 168 hours. Cardiac Enzymes: No results for input(s): CKTOTAL, CKMB, CKMBINDEX, TROPONINI in the last 168 hours. BNP (last 3 results) No results for input(s): PROBNP in the last 8760 hours. HbA1C: No results for input(s): HGBA1C in the last 72 hours. CBG: No results for input(s): GLUCAP in the last 168 hours. Lipid Profile: No results for input(s): CHOL, HDL, LDLCALC, TRIG, CHOLHDL, LDLDIRECT in the last 72 hours. Thyroid Function Tests: No results for input(s): TSH, T4TOTAL, FREET4, T3FREE, THYROIDAB in the last 72 hours. Anemia Panel: No results for input(s): VITAMINB12, FOLATE, FERRITIN, TIBC, IRON, RETICCTPCT in the last 72  hours. Sepsis Labs:  Recent Labs Lab 01/01/16 1205 01/01/16 1557  LATICACIDVEN 8.90* 9.21*    Recent Results (from the past 240 hour(s))  Blood Culture (routine x 2)     Status: Abnormal (Preliminary result)   Collection Time: 01/01/16 11:55 AM  Result Value Ref Range Status   Specimen Description BLOOD RIGHT ANTECUBITAL  Final   Special Requests BOTTLES DRAWN AEROBIC AND ANAEROBIC 6CC  Final   Culture  Setup Time   Final    GRAM NEGATIVE RODS IN BOTH AEROBIC AND ANAEROBIC BOTTLES CRITICAL RESULT CALLED TO, READ BACK BY AND VERIFIED WITH: S.STUART AT 0818 ON 01/02/2016 BY [email or web address] Organism ID to follow    Culture KLEBSIELLA PNEUMONIAE PROTEUS MIRABILIS  (A)  Final   Report Status PENDING  Incomplete  Blood Culture ID Panel (Reflexed)     Status: Abnormal   Collection Time: 01/01/16 11:55 AM  Result Value Ref Range Status   Enterococcus species NOT DETECTED NOT DETECTED Final   Listeria monocytogenes NOT DETECTED NOT DETECTED Final   Staphylococcus species NOT DETECTED NOT DETECTED Final   Staphylococcus aureus NOT DETECTED NOT DETECTED Final   Methicillin resistance NOT DETECTED NOT DETECTED Final   Streptococcus species NOT DETECTED NOT DETECTED Final   Streptococcus agalactiae NOT DETECTED NOT DETECTED Final   Streptococcus pneumoniae NOT DETECTED NOT DETECTED Final   Streptococcus pyogenes NOT DETECTED NOT DETECTED Final   Acinetobacter baumannii NOT DETECTED NOT DETECTED Final   Enterobacteriaceae species DETECTED (A) NOT DETECTED Final    Comment: CRITICAL RESULT CALLED TO, READ BACK BY AND VERIFIED WITH: B. [employer] Pharm.D. 13:30 01/02/16 ([email or web address])    Enterobacter cloacae complex NOT DETECTED NOT DETECTED Final   Escherichia coli NOT DETECTED NOT DETECTED Final   Klebsiella oxytoca NOT DETECTED NOT DETECTED Final   Klebsiella pneumoniae DETECTED (A) NOT DETECTED Final    Comment: CRITICAL RESULT CALLED TO, READ BACK BY AND VERIFIED WITH: B. [employer] Pharm.D. 13:30  01/02/16 ([email or web address])    Proteus species DETECTED (A) NOT DETECTED Final    Comment: CRITICAL RESULT CALLED TO, READ BACK BY AND VERIFIED WITH: B. [employer] Pharm.D. 13:30 01/02/16 (wilsonm)    Serratia marcescens NOT DETECTED NOT DETECTED Final   Haemophilus influenzae NOT DETECTED NOT DETECTED Final   Neisseria meningitidis NOT DETECTED NOT DETECTED Final   Pseudomonas aeruginosa NOT DETECTED NOT DETECTED Final   Candida albicans NOT DETECTED NOT DETECTED Final   Candida glabrata NOT DETECTED NOT DETECTED Final   Candida krusei NOT DETECTED NOT DETECTED Final   Candida parapsilosis NOT DETECTED NOT DETECTED Final   Candida tropicalis NOT DETECTED NOT DETECTED Final    Comment: [facility]  Blood Culture (routine x 2)     Status:  Abnormal (Preliminary result)   Collection Time: 01/01/16 11:58 AM  Result Value Ref Range Status   Specimen Description BLOOD RIGHT WRIST  Final   Special Requests BOTTLES DRAWN AEROBIC AND ANAEROBIC 6CC  Final   Culture  Setup Time   Final    GRAM NEGATIVE RODS IN BOTH AEROBIC AND ANAEROBIC BOTTLES CRITICAL RESULT CALLED TO, READ BACK BY AND VERIFIED WITH: S.STUART AT 0818 ON 01/02/2016 BY [email or web address]    Culture KLEBSIELLA PNEUMONIAE PROTEUS MIRABILIS  (A)  Final   Report Status PENDING  Incomplete  Urine culture     Status: Abnormal   Collection Time: 01/01/16 12:25 PM  Result Value Ref Range Status   Specimen Description URINE, CLEAN CATCH  Final   Special Requests NONE  Final   Culture MULTIPLE SPECIES PRESENT, SUGGEST RECOLLECTION (A)  Final   Report Status 01/02/2016 FINAL  Final  MRSA PCR Screening     Status: None   Collection Time: 01/01/16  6:03 PM  Result Value Ref Range Status   MRSA by PCR NEGATIVE NEGATIVE Final    Comment:        The GeneXpert MRSA Assay (FDA approved for NASAL specimens only), is one component of a comprehensive MRSA colonization surveillance program. It is not intended to diagnose MRSA infection nor  to guide or monitor treatment for MRSA infections.          Radiology Studies: Ct Renal Stone Study  Result Date: 01/01/2016 CLINICAL DATA:  Abdominal guarding, gradually worsening sob, tachypnea, fever. Hx of stroke, peg, MS,HTN, GERD. EXAM: CT ABDOMEN AND PELVIS WITHOUT CONTRAST TECHNIQUE: Multidetector CT imaging of the abdomen and pelvis was performed following the standard protocol without IV contrast. COMPARISON:  None. FINDINGS: Lower chest: There is consolidation in the right lower lobe. Although this may be all atelectasis, pneumonia is suspected. Milder opacity in the left lung base is consistent with atelectasis. Heart is normal in size. Hepatobiliary: Portal venous air is seen throughout portal branches in the left lobe. No liver mass or focal lesion. There are prominent gallstones. No convincing gallbladder wall thickening. No bile duct dilation. Pancreas: Unremarkable. No pancreatic ductal dilatation or surrounding inflammatory changes. Spleen: Normal in size without focal abnormality. Adrenals/Urinary Tract: No adrenal mass. There is marked right and moderate left hydronephrosis and hydroureter. Ureters are distended all the way to the bladder. No ureteral stones. Bladder is partly decompressed by Foley catheter. No convincing bladder mass. Balloon of the catheter lies directly adjacent to the luminal side of the ureterovesicular junctions. No renal masses or stones. Stomach/Bowel: Pneumatosis intestinalis is seen along the ileum would air extending into portal venous branches colon is mild-to-moderately distended most evident along the right colon. The ascending colon measures 6 cm in diameter. There is increase colonic and rectal stool. No colon pneumatosis intestinalis is evident. No colonic wall thickening. A percutaneously placed gastrostomy tube has its tip in the distal stomach. Vascular/Lymphatic: Mild atherosclerotic calcifications noted along a normal caliber abdominal aorta. No  discrete enlarged lymph nodes. Reproductive: Status post hysterectomy. No adnexal masses. Other: Trace amount of ascites noted along the pericolic gutters and in the posterior pelvic recess. No abdominal hernia. Musculoskeletal: Compression fracture of L4, most likely old. There is sclerosis along the visualized right ischium, as well as the lower sacrum, which appears to be the result of previous osteomyelitis. A soft tissue defect consistent with a decubitus ulcer overlies the lower sacrum and coccyx. There is a defect along the posterior margin of the right  ilium also consistent with chronic osteomyelitis. IMPRESSION: 1. 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 This is concerning for ischemic bowel although there is no significant small bowel wall thickening. 2. Right lower lobe consolidation consistent with pneumonia. 3. Marked right and moderate left hydronephrosis and hydroureter. No ureteral stone. No bladder mass. The Foley catheter balloon does appear to compress the ureterovesicular junctions and may be the cause of hydronephrosis. 4. Trace amount of ascites. 5. Moderate increased stool in the colon and rectum. 6. Gallstones.  No convincing acute cholecystitis. 7. Compression fracture of L4, most likely old. 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 Electronically Signed   By: Amie Portland M.D.   On: 01/01/2016 13:44        Scheduled Meds: . acetaminophen  650 mg Rectal Once  . Influenza vac split quadrivalent PF  0.5 mL Intramuscular Tomorrow-1000  . lacosamide  100 mg Oral BID  . levETIRAcetam  500 mg Oral BID  . pneumococcal 23 valent vaccine  0.5 mL Intramuscular Tomorrow-1000  . sodium chloride flush  3 mL Intravenous Q12H   Continuous Infusions: . morphine 5 mg/hr (01/03/16 1010)     LOS: 2 days    Time spent: 15 minutes     Erick Blinks, MD [facility] if 7PM-7AM, please contact  night-coverage [email or web address] Password [email or web address] 01/03/2016, 1:06 PM

## 2016-01-04 LAB — CULTURE, BLOOD (ROUTINE X 2)

## 2016-01-04 MED ORDER — SODIUM CHLORIDE 0.9 % IV SOLN
2.0000 mg/h | INTRAVENOUS | Status: DC
  Administered 2016-01-04: 2 mg/h via INTRAVENOUS
  Filled 2016-01-04: qty 5

## 2016-01-04 MED ORDER — LORAZEPAM 2 MG/ML IJ SOLN: 1.0000 mg | INTRAMUSCULAR | 0 refills | Status: AC | PRN

## 2016-01-04 MED ORDER — HYDROMORPHONE HCL PF 10 MG/ML IJ SOLN: 2.0000 mg/h | INTRAMUSCULAR | 0 refills | Status: AC

## 2016-01-04 MED ORDER — SODIUM CHLORIDE 0.9 % IV SOLN: 2.0000 mg/h | INTRAVENOUS | Status: DC

## 2016-01-04 MED ORDER — GLYCOPYRROLATE 1 MG PO TABS: 1.0000 mg | ORAL_TABLET | ORAL | Status: AC | PRN

## 2016-01-04 MED ORDER — ACETAMINOPHEN 325 MG PO TABS
650.0000 mg | ORAL_TABLET | Freq: Four times a day (QID) | ORAL | Status: AC | PRN
Start: 2016-01-04 — End: ?

## 2016-01-04 NOTE — Care Management Important Message (Signed)
Important Message  Patient Details  Name: [name] MRN: [id number] Date of Birth: 02-Mar-1951   Medicare Important Message Given:  Yes    Malcolm Metro, RN 01/04/2016, 11:40 AM

## 2016-01-04 NOTE — Clinical Social Work Note (Signed)
Patient Information   Patient Name [name], [name] ([id number]) Sex Female DOB 1950-11-04  Room Bed  A324 A324-01  Patient Demographics   Address [address] [place] [address] Phone [phone] (Home)  Patient Ethnicity & Race   Ethnic Group Patient Race  Not Hispanic or Latino Black or African American  Emergency Contact(Knight)   Name Relation Home Work Mobile  PalmerStokes,Regina Sister [phone]   Trenton GammonStokes,[name] Sister [phone]    Documents on File    Status Date Received Description  Documents for the Patient  Historic Radiology Documentation  05/25/10   [place] HIPAA NOTICE OF PRIVACY - Scanned Not Received    [place] E-Signature HIPAA Notice of Privacy     [place] E-Signature HIPAA Notice of Privacy Spanish     Driver'Knight License Not Received    Insurance Card Not Received    Advance Directives/Living Will/HCPOA/POA Not Received    Financial Application Not Received    [place] HIPAA NOTICE OF PRIVACY - Scanned  04/13/11   HIM ROI Authorization (Expired) 09/18/11   Release of Information  09/19/11   Advance Directives/Living Will/HCPOA/POA  12/04/11   HIM ROI Authorization (Expired) 12/04/11 MAR'Knight to Christella HartiganJacobs cr  Release of Information  12/08/11   Other Photo ID Not Received    Documents for the Encounter  AOB (Assignment of Insurance Benefits) Received 01/01/16 unable to sign  E-signature AOB     MEDICARE RIGHTS Received 01/01/16 unable to sign  E-signature Medicare Rights     ED Patient Billing Extract   ED PB Summary  ED Patient Billing Extract   ED Encounter Summary  EKG  01/03/16   Admission Information   Attending Provider Admitting Provider Admission Type Admission Date/Time  Erick BlinksJehanzeb Memon, MD Erick BlinksJehanzeb Memon, MD Emergency 01/01/16 1136  Discharge Date Hospital Service Auth/Cert Status Service Area   Internal Medicine Incomplete [place] SERVICE AREA  Unit Room/Bed Admission Status   AP-DEPT 300 A324/A324-01  Admission (Confirmed)   Admission   Complaint  SOB  Hospital Account   Name Acct ID Class Status Primary Coverage  [name], [name] [id number] Inpatient Open MEDICARE - MEDICARE PART A AND B      Guarantor Account (for Hospital Account [id number])   Name Relation to Pt Service Area Active? Acct Type  [name], [name] Self CHSA Yes Personal/Family  Address Phone    [address] [place], [place] [address] [phone])        Coverage Information (for Hospital Account [id number])   1. MEDICARE/MEDICARE PART A AND B   F/O Payor/Plan Precert #  MEDICARE/MEDICARE PART A AND B   Subscriber Subscriber #  [name], [name] [id number]  Address Phone  [address] [place] [id number]   2. MEDICAID [place]/MEDICAID OF [place]   F/O Payor/Plan Precert #  MEDICAID [place]/MEDICAID OF [place]   Subscriber Subscriber #  [name], [name] [id number]  Address Phone  [address] [address] [place], [place] [address]

## 2016-01-04 NOTE — Progress Notes (Signed)
Dressing around PEG site changed twice during shift. Last change at 0545. PEG draining from insertion site. Continue to monitor.

## 2016-01-04 NOTE — Care Management Note (Signed)
Case Management Note  Patient Details  Name: ELAYJAH VIGNEAULT MRN: [id number] Date of Birth: 03/28/1950  Subjective/Objective:                  Pt admitted with ARF. Pt is discharging today to [facility]. CSW is involved and have made arrangements for Hospice placement.   Action/Plan: No CM needs.   Expected Discharge Date:     01/04/2016             Expected Discharge Plan:  Hospice Medical Facility  In-House Referral:  Clinical Social Work  Discharge planning Services  CM Consult  Post Acute Care Choice:  NA Choice offered to:  NA  DME Arranged:    DME Agency:     HH Arranged:    HH Agency:     Status of Service:  Completed, signed off  If discussed at [employer], dates discussed:    Additional Comments:  Malcolm Metro, RN 01/04/2016, 11:41 AM

## 2016-01-04 NOTE — Clinical Social Work Note (Signed)
Clinical Social Work Assessment  Patient Details  Name: [name] MRN: [id number] Date of Birth: 1951-02-01  Date of referral:  01/04/16               Reason for consult:  Discharge Planning                Permission sought to share information with:    Permission granted to share information::  Yes, Verbal Permission Granted  Name::        Agency::  [facility]  Relationship::  facility  Contact Information:     Housing/Transportation Living arrangements for the past 2 months:  [place] of Information:  Power of Attorney Patient Interpreter Needed:  None Criminal Activity/Legal Involvement Pertinent to Current Situation/Hospitalization:  No - Comment as needed Significant Relationships:  Siblings Lives with:  Facility Resident Do you feel safe going back to the place where you live?  Yes Need for family participation in patient care:  Yes (Comment)  Care giving concerns:  Pt is comfort care and on morphine drip.    Social Worker assessment / plan: CSW met with pt's sister, [name] who reports she and her sister [name] are [place]. This was confirmed with [facility] where pt has been a resident for the past year. [name] lives in [place] and came in Sunday after pt was admitted to hospital. Pt has MS and history of stroke and is bedbound and nonverbal at baseline. Decision was made yesterday for comfort care and pt on morphine drip. Pt appears stable for transfer to [place] per MD. This was discussed with [name] who is agreeable. Bed is available today and pt will transport via [facility]. CSW notified [facility] of plan with [name]'s permission.   Employment status:  Disabled (Comment on whether or not currently receiving Disability) Insurance information:  Medicare PT Recommendations:  Not assessed at this time Information / Referral to community resources:  Other (Comment Required) (Residential hospice)  Patient/Family's Response to care:  Pt's  sister initially felt that pt should stay in hospital, but after discussion with MD is agreeable to transfer to [place] for end of life care.   Patient/Family's Understanding of and Emotional Response to Diagnosis, Current Treatment, and Prognosis:  Pt's sister is aware of poor prognosis and is tearful sitting at bedside. Support provided.   Emotional Assessment Appearance:  Appears stated age Attitude/Demeanor/Rapport:  Unable to Assess Affect (typically observed):  Unable to Assess Orientation:    Alcohol / Substance use:  Not Applicable Psych involvement (Current and /or in the community):  No (Comment)  Discharge Needs  Concerns to be addressed:  Discharge Planning Concerns Readmission within the last 30 days:  No Current discharge risk:  Terminally ill Barriers to Discharge:  No Barriers Identified   Salome Arnt, [place] 01/04/2016, 11:56 AM [phone]

## 2016-01-04 NOTE — Progress Notes (Signed)
[facility] discharged to [facility] ,report called,and given to Anheuser-BuschSandra RN. Transported via EMS to awaiting facility.

## 2016-01-04 NOTE — Discharge Summary (Addendum)
Physician Discharge Summary  MARCHA LICKLIDER WUJ:[id number] DOB: Jul 30, 1950 DOA: 01/01/2016  PCP: Colon Branch, MD  Admit date: 01/01/2016 Discharge date: 01/04/2016  Admitted From: SNF Disposition:  Residential hospice  Recommendations for Outpatient Follow-up:  1. Patient discharging to residential hospice for end of life care  Discharge Condition: terminal CODE STATUS:DNR, comfort care Diet recommendation: NPO  Brief/Interim Summary: 34 yof with a hx of previous stroke, MS who is essentially bedbound, nonverbal, and has a PEG tube presented with complaints of a shortness of breath and a fever. While in the ED, she was noted to be febrile and tachycardic. Chest x-ray indicated evidence of pneumonia. She was also noted to have an elevated lactic acid of 8.9. CT of abdomen revealed possible ischemic bowel. Patient was admitted for further evaluation  Discharge Diagnoses:  Active Problems:   Multiple sclerosis (HCC)   Hypothyroid   Aphasia   HCAP (healthcare-associated pneumonia)   Seizure disorder (HCC)   Status post insertion of percutaneous endoscopic gastrostomy (PEG) tube (HCC)   DNR (do not resuscitate)   Acute renal failure (ARF) (HCC)   Sepsis (HCC)   Ischemic bowel disease (HCC)   Bilateral hydronephrosis   Pressure injury of skin   Goals of care, counseling/discussion  Patient was admitted with gram-negative sepsis, ischemic bowel, HCAP and acute renal failure. Her underlying comorbidities include multiple sclerosis, previous stroke with significant aphasia, bedbound, PEG tube dependent, seizure disorder. Patient has very poor health at baseline. With her acute illness, case was discussed with general surgery. She was not a candidate for surgical intervention. Case was discussed with patient's family members who confirmed patient was a DO NOT RESUSCITATE, but also agreed to comfort measures only. She was started on morphine infusion for respiratory distress and her  breathing has since improved. Although she is tachycardic, blood pressure has remained stable. She was seen by palliative care. She'll now be transferred to residential hospice for further end-of-life care.  Discharge Instructions  Discharge Instructions    Diet - low sodium heart healthy    Complete by:  As directed    Increase activity slowly    Complete by:  As directed        Medication List    STOP taking these medications   BIOTIN PO   diazepam 5 MG tablet Commonly known as:  VALIUM   feeding supplement (PRO-STAT SUGAR FREE 64) Liqd   fentaNYL 12 MCG/HR Commonly known as:  DURAGESIC - dosed mcg/hr   insulin aspart 100 UNIT/ML injection Commonly known as:  novoLOG   interferon beta-1a 30 MCG/0.5ML injection Commonly known as:  AVONEX   levothyroxine 137 MCG tablet Commonly known as:  SYNTHROID, LEVOTHROID   LORazepam 2 MG/ML concentrated solution Commonly known as:  ATIVAN Replaced by:  LORazepam 2 MG/ML injection   oxyCODONE 5 MG/5ML solution Commonly known as:  ROXICODONE   pantoprazole 40 MG tablet Commonly known as:  PROTONIX   SANTYL ointment Generic drug:  collagenase   senna 8.6 MG tablet Commonly known as:  SENOKOT     TAKE these medications   acetaminophen 650 MG CR tablet Commonly known as:  TYLENOL 650 mg by Enteral route daily as needed for pain. What changed:  Another medication with the same name was added. Make sure you understand how and when to take each.   acetaminophen 325 MG tablet Commonly known as:  TYLENOL Place 2 tablets (650 mg total) into feeding tube every 6 (six) hours as needed for mild pain (or  Fever >/= 101). What changed:  You were already taking a medication with the same name, and this prescription was added. Make sure you understand how and when to take each.   albuterol (2.5 MG/3ML) 0.083% nebulizer solution Commonly known as:  PROVENTIL Take 2.5 mg by nebulization every 4 (four) hours as needed for wheezing or  shortness of breath.   glycopyrrolate 1 MG tablet Commonly known as:  ROBINUL Place 1 tablet (1 mg total) into feeding tube every 4 (four) hours as needed (excessive secretions).   HYDROmorphone 25 mg in sodium chloride 0.9 % 47.5 mL Inject 2 mg/hr into the vein continuous.   Lacosamide 100 MG Tabs Place 1 tablet (100 mg total) into feeding tube 2 (two) times daily. What changed:  how to take this   levETIRAcetam 500 MG tablet Commonly known as:  KEPPRA Take 1 tablet (500 mg total) by mouth 2 (two) times daily. What changed:  how to take this   LORazepam 2 MG/ML injection Commonly known as:  ATIVAN Inject 0.5 mLs (1 mg total) into the vein every 4 (four) hours as needed for anxiety or seizure. Replaces:  LORazepam 2 MG/ML concentrated solution      Follow-up Information    Colon BranchQURESHI, AYYAZ, MD.   Specialty:  Internal Medicine Contact information: [address] [place] [id number] [phone]          No Known Allergies  Consultations:     Procedures/Studies: Dg Chest Portable 1 View  Result Date: 01/01/2016 CLINICAL DATA:  Shortness of breath and tachypnea. Fever. Sepsis. Possible aspiration. EXAM: PORTABLE CHEST 1 VIEW COMPARISON:  04/15/2015 FINDINGS: Low lung volumes are noted. Asymmetric airspace opacity is seen in the right mid and lower lung fields, suspicious for pneumonia. Left lung is clear. No evidence of pneumothorax or pleural effusion. Heart size is within normal limits. IMPRESSION: Asymmetric airspace disease in right mid and lower lung fields, suspicious for pneumonia. Electronically Signed   By: Myles RosenthalJohn  Stahl M.D.   On: 01/01/2016 12:35   Ct Renal Stone Study  Result Date: 01/01/2016 CLINICAL DATA:  Abdominal guarding, gradually worsening sob, tachypnea, fever. Hx of stroke, peg, MS,HTN, GERD. EXAM: CT ABDOMEN AND PELVIS WITHOUT CONTRAST TECHNIQUE: Multidetector CT imaging of the abdomen and pelvis was performed following the standard protocol  without IV contrast. COMPARISON:  None. FINDINGS: Lower chest: There is consolidation in the right lower lobe. Although this may be all atelectasis, pneumonia is suspected. Milder opacity in the left lung base is consistent with atelectasis. Heart is normal in size. Hepatobiliary: Portal venous air is seen throughout portal branches in the left lobe. No liver mass or focal lesion. There are prominent gallstones. No convincing gallbladder wall thickening. No bile duct dilation. Pancreas: Unremarkable. No pancreatic ductal dilatation or surrounding inflammatory changes. Spleen: Normal in size without focal abnormality. Adrenals/Urinary Tract: No adrenal mass. There is marked right and moderate left hydronephrosis and hydroureter. Ureters are distended all the way to the bladder. No ureteral stones. Bladder is partly decompressed by Foley catheter. No convincing bladder mass. Balloon of the catheter lies directly adjacent to the luminal side of the ureterovesicular junctions. No renal masses or stones. Stomach/Bowel: Pneumatosis intestinalis is seen along the ileum would air extending into portal venous branches colon is mild-to-moderately distended most evident along the right colon. The ascending colon measures 6 cm in diameter. There is increase colonic and rectal stool. No colon pneumatosis intestinalis is evident. No colonic wall thickening. A percutaneously placed gastrostomy tube  has its tip in the distal stomach. Vascular/Lymphatic: Mild atherosclerotic calcifications noted along a normal caliber abdominal aorta. No discrete enlarged lymph nodes. Reproductive: Status post hysterectomy. No adnexal masses. Other: Trace amount of ascites noted along the pericolic gutters and in the posterior pelvic recess. No abdominal hernia. Musculoskeletal: Compression fracture of L4, most likely old. There is sclerosis along the visualized right ischium, as well as the lower sacrum, which appears to be the result of previous  osteomyelitis. A soft tissue defect consistent with a decubitus ulcer overlies the lower sacrum and coccyx. There is a defect along the posterior margin of the right ilium also consistent with chronic osteomyelitis. IMPRESSION: 1. 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 This is concerning for ischemic bowel although there is no significant small bowel wall thickening. 2. Right lower lobe consolidation consistent with pneumonia. 3. Marked right and moderate left hydronephrosis and hydroureter. No ureteral stone. No bladder mass. The Foley catheter balloon does appear to compress the ureterovesicular junctions and may be the cause of hydronephrosis. 4. Trace amount of ascites. 5. Moderate increased stool in the colon and rectum. 6. Gallstones.  No convincing acute cholecystitis. 7. Compression fracture of L4, most likely old. 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 Electronically Signed   By: Amie Portland M.D.   On: 01/01/2016 13:44       Subjective: Nonverbal  Discharge Exam: Vitals:   01/03/16 2251 01/04/16 0627  BP: 118/67 (!) 118/58  Pulse: (!) 133 (!) 129  Resp: (!) 28 (!) 28  Temp: 99.2 F (37.3 C) 98.5 F (36.9 C)   Vitals:   01/03/16 1355 01/03/16 1738 01/03/16 2251 01/04/16 0627  BP: 116/74  118/67 (!) 118/58  Pulse: (!) 124  (!) 133 (!) 129  Resp: (!) 28 (!) 27 (!) 28 (!) 28  Temp: 99 F (37.2 C)  99.2 F (37.3 C) 98.5 F (36.9 C)  TempSrc: Axillary  Oral Oral  SpO2: 96%  94% 96%  Weight:      Height:        General: Pt is alert, awake, not in acute distress Respiratory: CTA bilaterally, no wheezing, no rhonchi     The results of significant diagnostics from this hospitalization (including imaging, microbiology, ancillary and laboratory) are listed below for reference.     Microbiology: Recent Results (from the past 240 hour(s))  Blood Culture (routine x 2)     Status: Abnormal   Collection Time: 01/01/16  11:55 AM  Result Value Ref Range Status   Specimen Description BLOOD RIGHT ANTECUBITAL  Final   Special Requests BOTTLES DRAWN AEROBIC AND ANAEROBIC 6CC  Final   Culture  Setup Time   Final    GRAM NEGATIVE RODS IN BOTH AEROBIC AND ANAEROBIC BOTTLES CRITICAL RESULT CALLED TO, READ BACK BY AND VERIFIED WITH: S.STUART AT 0818 ON 01/02/2016 BY WOODS.M Organism ID to follow    Culture KLEBSIELLA PNEUMONIAE PROTEUS MIRABILIS  (A)  Final   Report Status 01/04/2016 FINAL  Final   Organism ID, Bacteria KLEBSIELLA PNEUMONIAE  Final   Organism ID, Bacteria PROTEUS MIRABILIS  Final      Susceptibility   Klebsiella pneumoniae - MIC*    AMPICILLIN >=32 RESISTANT Resistant     CEFAZOLIN <=4 SENSITIVE Sensitive     CEFEPIME <=1 SENSITIVE Sensitive     CEFTAZIDIME <=1 SENSITIVE Sensitive     CEFTRIAXONE <=1 SENSITIVE Sensitive     CIPROFLOXACIN <=0.25 SENSITIVE Sensitive  GENTAMICIN <=1 SENSITIVE Sensitive     IMIPENEM <=0.25 SENSITIVE Sensitive     TRIMETH/SULFA <=20 SENSITIVE Sensitive     AMPICILLIN/SULBACTAM 4 SENSITIVE Sensitive     PIP/TAZO <=4 SENSITIVE Sensitive     Extended ESBL NEGATIVE Sensitive     * KLEBSIELLA PNEUMONIAE   Proteus mirabilis - MIC*    AMPICILLIN <=2 SENSITIVE Sensitive     CEFAZOLIN <=4 SENSITIVE Sensitive     CEFEPIME <=1 SENSITIVE Sensitive     CEFTAZIDIME <=1 SENSITIVE Sensitive     CEFTRIAXONE <=1 SENSITIVE Sensitive     CIPROFLOXACIN >=4 RESISTANT Resistant     GENTAMICIN <=1 SENSITIVE Sensitive     IMIPENEM 2 SENSITIVE Sensitive     TRIMETH/SULFA <=20 SENSITIVE Sensitive     AMPICILLIN/SULBACTAM <=2 SENSITIVE Sensitive     PIP/TAZO <=4 SENSITIVE Sensitive     * PROTEUS MIRABILIS  Blood Culture ID Panel (Reflexed)     Status: Abnormal   Collection Time: 01/01/16 11:55 AM  Result Value Ref Range Status   Enterococcus species NOT DETECTED NOT DETECTED Final   Listeria monocytogenes NOT DETECTED NOT DETECTED Final   Staphylococcus species NOT DETECTED  NOT DETECTED Final   Staphylococcus aureus NOT DETECTED NOT DETECTED Final   Methicillin resistance NOT DETECTED NOT DETECTED Final   Streptococcus species NOT DETECTED NOT DETECTED Final   Streptococcus agalactiae NOT DETECTED NOT DETECTED Final   Streptococcus pneumoniae NOT DETECTED NOT DETECTED Final   Streptococcus pyogenes NOT DETECTED NOT DETECTED Final   Acinetobacter baumannii NOT DETECTED NOT DETECTED Final   Enterobacteriaceae species DETECTED (A) NOT DETECTED Final    Comment: CRITICAL RESULT CALLED TO, READ BACK BY AND VERIFIED WITH: B. [employer].D. 13:30 01/02/16 (wilsonm)    Enterobacter cloacae complex NOT DETECTED NOT DETECTED Final   Escherichia coli NOT DETECTED NOT DETECTED Final   Klebsiella oxytoca NOT DETECTED NOT DETECTED Final   Klebsiella pneumoniae DETECTED (A) NOT DETECTED Final    Comment: CRITICAL RESULT CALLED TO, READ BACK BY AND VERIFIED WITH: B. [employer].D. 13:30 01/02/16 (wilsonm)    Proteus species DETECTED (A) NOT DETECTED Final    Comment: CRITICAL RESULT CALLED TO, READ BACK BY AND VERIFIED WITH: B. [employer].D. 13:30 01/02/16 (wilsonm)    Serratia marcescens NOT DETECTED NOT DETECTED Final   Haemophilus influenzae NOT DETECTED NOT DETECTED Final   Neisseria meningitidis NOT DETECTED NOT DETECTED Final   Pseudomonas aeruginosa NOT DETECTED NOT DETECTED Final   Candida albicans NOT DETECTED NOT DETECTED Final   Candida glabrata NOT DETECTED NOT DETECTED Final   Candida krusei NOT DETECTED NOT DETECTED Final   Candida parapsilosis NOT DETECTED NOT DETECTED Final   Candida tropicalis NOT DETECTED NOT DETECTED Final    Comment: Performed at [facility]  Blood Culture (routine x 2)     Status: Abnormal   Collection Time: 01/01/16 11:58 AM  Result Value Ref Range Status   Specimen Description BLOOD RIGHT WRIST  Final   Special Requests BOTTLES DRAWN AEROBIC AND ANAEROBIC 6CC  Final   Culture  Setup Time   Final    GRAM NEGATIVE  RODS IN BOTH AEROBIC AND ANAEROBIC BOTTLES CRITICAL RESULT CALLED TO, READ BACK BY AND VERIFIED WITH: S.STUART AT 0818 ON 01/02/2016 BY [email or web address]    Culture (A)  Final    KLEBSIELLA PNEUMONIAE PROTEUS MIRABILIS SUSCEPTIBILITIES PERFORMED ON PREVIOUS CULTURE WITHIN THE LAST 5 DAYS. Performed at [facility]    Report Status 01/04/2016 FINAL  Final  Urine  culture     Status: Abnormal   Collection Time: 01/01/16 12:25 PM  Result Value Ref Range Status   Specimen Description URINE, CLEAN CATCH  Final   Special Requests NONE  Final   Culture MULTIPLE SPECIES PRESENT, SUGGEST RECOLLECTION (A)  Final   Report Status 01/02/2016 FINAL  Final  MRSA PCR Screening     Status: None   Collection Time: 01/01/16  6:03 PM  Result Value Ref Range Status   MRSA by PCR NEGATIVE NEGATIVE Final    Comment:        The GeneXpert MRSA Assay (FDA approved for NASAL specimens only), is one component of a comprehensive MRSA colonization surveillance program. It is not intended to diagnose MRSA infection nor to guide or monitor treatment for MRSA infections.      Labs: BNP (last 3 results) No results for input(s): BNP in the last 8760 hours. Basic Metabolic Panel:  Recent Labs Lab 01/01/16 1155  NA 135  K 5.0  CL 98*  CO2 18*  GLUCOSE 189*  BUN 72*  CREATININE 2.29*  CALCIUM 9.8   Liver Function Tests:  Recent Labs Lab 01/01/16 1155  AST 50*  ALT 20  ALKPHOS 113  BILITOT 0.9  PROT 9.4*  ALBUMIN 3.3*   No results for input(s): LIPASE, AMYLASE in the last 168 hours. No results for input(s): AMMONIA in the last 168 hours. CBC:  Recent Labs Lab 01/01/16 1155  WBC 10.5  NEUTROABS 9.4*  HGB 13.8  HCT 44.2  MCV 73.1*  PLT 362   Cardiac Enzymes: No results for input(s): CKTOTAL, CKMB, CKMBINDEX, TROPONINI in the last 168 hours. BNP: Invalid input(s): POCBNP CBG: No results for input(s): GLUCAP in the last 168 hours. D-Dimer No results for input(s): DDIMER in the  last 72 hours. Hgb A1c No results for input(s): HGBA1C in the last 72 hours. Lipid Profile No results for input(s): CHOL, HDL, LDLCALC, TRIG, CHOLHDL, LDLDIRECT in the last 72 hours. Thyroid function studies No results for input(s): TSH, T4TOTAL, T3FREE, THYROIDAB in the last 72 hours.  Invalid input(s): FREET3 Anemia work up No results for input(s): VITAMINB12, FOLATE, FERRITIN, TIBC, IRON, RETICCTPCT in the last 72 hours. Urinalysis    Component Value Date/Time   COLORURINE YELLOW 01/01/2016 1225   APPEARANCEUR TURBID (A) 01/01/2016 1225   LABSPEC 1.020 01/01/2016 1225   PHURINE 7.5 01/01/2016 1225   GLUCOSEU NEGATIVE 01/01/2016 1225   HGBUR LARGE (A) 01/01/2016 1225   BILIRUBINUR NEGATIVE 01/01/2016 1225   KETONESUR NEGATIVE 01/01/2016 1225   PROTEINUR >300 (A) 01/01/2016 1225   UROBILINOGEN 1.0 05/21/2014 0244   NITRITE NEGATIVE 01/01/2016 1225   LEUKOCYTESUR LARGE (A) 01/01/2016 1225   Sepsis Labs Invalid input(s): PROCALCITONIN,  WBC,  LACTICIDVEN Microbiology Recent Results (from the past 240 hour(s))  Blood Culture (routine x 2)     Status: Abnormal   Collection Time: 01/01/16 11:55 AM  Result Value Ref Range Status   Specimen Description BLOOD RIGHT ANTECUBITAL  Final   Special Requests BOTTLES DRAWN AEROBIC AND ANAEROBIC 6CC  Final   Culture  Setup Time   Final    GRAM NEGATIVE RODS IN BOTH AEROBIC AND ANAEROBIC BOTTLES CRITICAL RESULT CALLED TO, READ BACK BY AND VERIFIED WITH: S.STUART AT 0818 ON 01/02/2016 BY [email or web address].M Organism ID to follow    Culture KLEBSIELLA PNEUMONIAE PROTEUS MIRABILIS  (A)  Final   Report Status 01/04/2016 FINAL  Final   Organism ID, Bacteria KLEBSIELLA PNEUMONIAE  Final   Organism ID, Bacteria  PROTEUS MIRABILIS  Final      Susceptibility   Klebsiella pneumoniae - MIC*    AMPICILLIN >=32 RESISTANT Resistant     CEFAZOLIN <=4 SENSITIVE Sensitive     CEFEPIME <=1 SENSITIVE Sensitive     CEFTAZIDIME <=1 SENSITIVE Sensitive      CEFTRIAXONE <=1 SENSITIVE Sensitive     CIPROFLOXACIN <=0.25 SENSITIVE Sensitive     GENTAMICIN <=1 SENSITIVE Sensitive     IMIPENEM <=0.25 SENSITIVE Sensitive     TRIMETH/SULFA <=20 SENSITIVE Sensitive     AMPICILLIN/SULBACTAM 4 SENSITIVE Sensitive     PIP/TAZO <=4 SENSITIVE Sensitive     Extended ESBL NEGATIVE Sensitive     * KLEBSIELLA PNEUMONIAE   Proteus mirabilis - MIC*    AMPICILLIN <=2 SENSITIVE Sensitive     CEFAZOLIN <=4 SENSITIVE Sensitive     CEFEPIME <=1 SENSITIVE Sensitive     CEFTAZIDIME <=1 SENSITIVE Sensitive     CEFTRIAXONE <=1 SENSITIVE Sensitive     CIPROFLOXACIN >=4 RESISTANT Resistant     GENTAMICIN <=1 SENSITIVE Sensitive     IMIPENEM 2 SENSITIVE Sensitive     TRIMETH/SULFA <=20 SENSITIVE Sensitive     AMPICILLIN/SULBACTAM <=2 SENSITIVE Sensitive     PIP/TAZO <=4 SENSITIVE Sensitive     * PROTEUS MIRABILIS  Blood Culture ID Panel (Reflexed)     Status: Abnormal   Collection Time: 01/01/16 11:55 AM  Result Value Ref Range Status   Enterococcus species NOT DETECTED NOT DETECTED Final   Listeria monocytogenes NOT DETECTED NOT DETECTED Final   Staphylococcus species NOT DETECTED NOT DETECTED Final   Staphylococcus aureus NOT DETECTED NOT DETECTED Final   Methicillin resistance NOT DETECTED NOT DETECTED Final   Streptococcus species NOT DETECTED NOT DETECTED Final   Streptococcus agalactiae NOT DETECTED NOT DETECTED Final   Streptococcus pneumoniae NOT DETECTED NOT DETECTED Final   Streptococcus pyogenes NOT DETECTED NOT DETECTED Final   Acinetobacter baumannii NOT DETECTED NOT DETECTED Final   Enterobacteriaceae species DETECTED (A) NOT DETECTED Final    Comment: CRITICAL RESULT CALLED TO, READ BACK BY AND VERIFIED WITH: B. [employer].D. 13:30 01/02/16 (wilsonm)    Enterobacter cloacae complex NOT DETECTED NOT DETECTED Final   Escherichia coli NOT DETECTED NOT DETECTED Final   Klebsiella oxytoca NOT DETECTED NOT DETECTED Final   Klebsiella pneumoniae  DETECTED (A) NOT DETECTED Final    Comment: CRITICAL RESULT CALLED TO, READ BACK BY AND VERIFIED WITH: B. [employer].D. 13:30 01/02/16 (wilsonm)    Proteus species DETECTED (A) NOT DETECTED Final    Comment: CRITICAL RESULT CALLED TO, READ BACK BY AND VERIFIED WITH: B. [employer].D. 13:30 01/02/16 (wilsonm)    Serratia marcescens NOT DETECTED NOT DETECTED Final   Haemophilus influenzae NOT DETECTED NOT DETECTED Final   Neisseria meningitidis NOT DETECTED NOT DETECTED Final   Pseudomonas aeruginosa NOT DETECTED NOT DETECTED Final   Candida albicans NOT DETECTED NOT DETECTED Final   Candida glabrata NOT DETECTED NOT DETECTED Final   Candida krusei NOT DETECTED NOT DETECTED Final   Candida parapsilosis NOT DETECTED NOT DETECTED Final   Candida tropicalis NOT DETECTED NOT DETECTED Final    Comment: Performed at [facility]  Blood Culture (routine x 2)     Status: Abnormal   Collection Time: 01/01/16 11:58 AM  Result Value Ref Range Status   Specimen Description BLOOD RIGHT WRIST  Final   Special Requests BOTTLES DRAWN AEROBIC AND ANAEROBIC [facility]  Final   Culture  Setup Time   Final  GRAM NEGATIVE RODS IN BOTH AEROBIC AND ANAEROBIC BOTTLES CRITICAL RESULT CALLED TO, READ BACK BY AND VERIFIED WITH: S.STUART AT 0818 ON 01/02/2016 BY [email or web address]    Culture (A)  Final    KLEBSIELLA PNEUMONIAE PROTEUS MIRABILIS SUSCEPTIBILITIES PERFORMED ON PREVIOUS CULTURE WITHIN THE LAST 5 DAYS. Performed at [facility]    Report Status 01/04/2016 FINAL  Final  Urine culture     Status: Abnormal   Collection Time: 01/01/16 12:25 PM  Result Value Ref Range Status   Specimen Description URINE, CLEAN CATCH  Final   Special Requests NONE  Final   Culture MULTIPLE SPECIES PRESENT, SUGGEST RECOLLECTION (A)  Final   Report Status 01/02/2016 FINAL  Final  MRSA PCR Screening     Status: None   Collection Time: 01/01/16  6:03 PM  Result Value Ref Range Status   MRSA by PCR NEGATIVE NEGATIVE  Final    Comment:        The GeneXpert MRSA Assay (FDA approved for NASAL specimens only), is one component of a comprehensive MRSA colonization surveillance program. It is not intended to diagnose MRSA infection nor to guide or monitor treatment for MRSA infections.      Time coordinating discharge: 20 minutes  SIGNED:   Erick Blinks, MD  [facility] 01/04/2016, 11:34 AM Pager   If 7PM-7AM, please contact night-coverage [email or web address] Password [email or web address]

## 2016-01-26 DEATH — deceased

## 2016-06-29 IMAGING — DX DG CHEST 1V PORT
1 series · 1 of 1 positions shown · non-contrast
Comparison: 04/12/2015

CLINICAL DATA: Pneumonia

EXAM:
PORTABLE CHEST 1 VIEW

[chest ap]
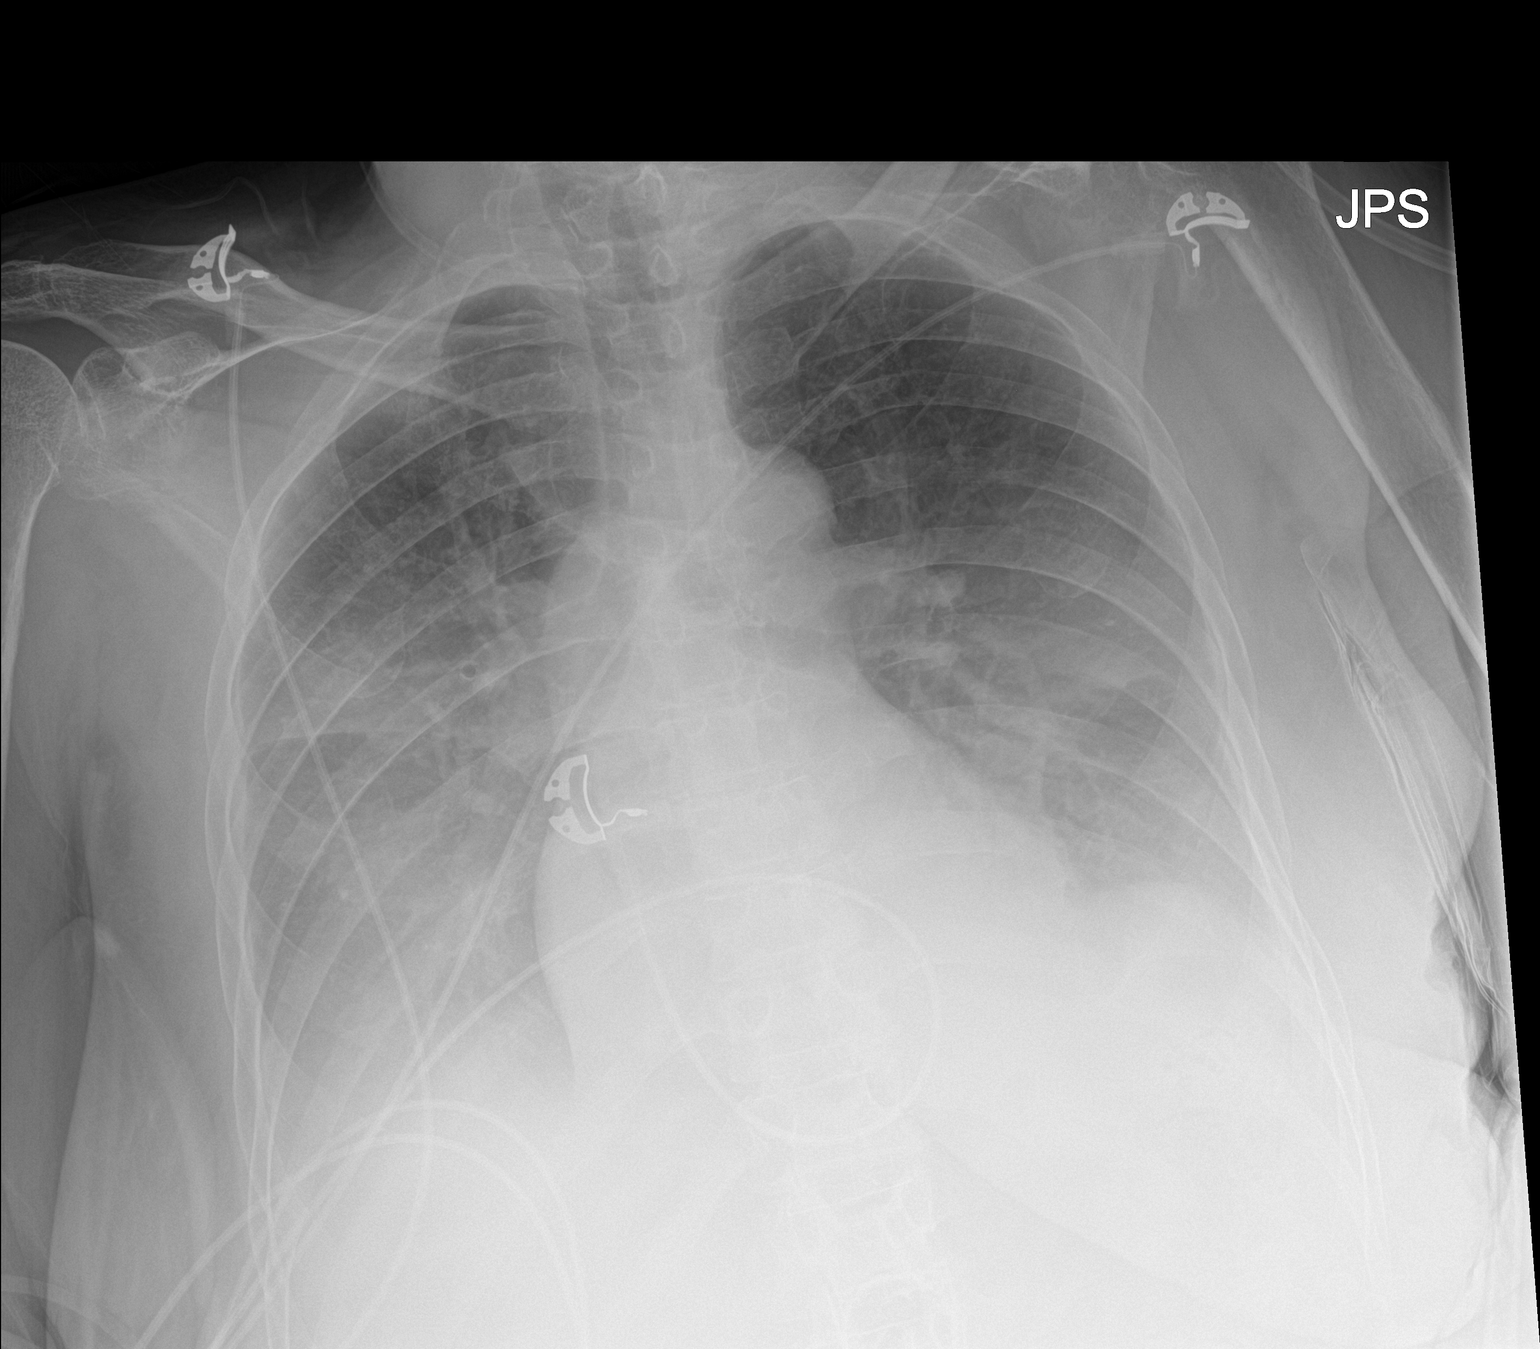

[1 of 1 positions shown; findings below may reference images not displayed]

FINDINGS: Graded hazy appearance of the bilateral chest consistent with
layering pleural effusions and atelectasis. No superimposed air
bronchogram to identify reported pneumonia. Normal heart size.
Negative aortic and hilar contours.
IMPRESSION: Layering pleural effusions and atelectasis. Superimposed pneumonia
could be obscured.

## 2017-03-17 IMAGING — CT CT RENAL STONE PROTOCOL
2 of 4 series · 15 of 46 positions shown, 17 images · non-contrast
Comparison: None.

CLINICAL DATA: Abdominal guarding, gradually worsening sob,
tachypnea, fever. Hx of stroke, peg, MS,HTN, GERD.

EXAM:
CT ABDOMEN AND PELVIS WITHOUT CONTRAST
TECHNIQUE: Multidetector CT imaging of the abdomen and pelvis was performed
following the standard protocol without IV contrast.

[Series 2: axial st · axial · 0.68mm/px · z∈[-394,-29]mm · 12 of 83 slices shown, 14 images]
[im 5/83  soft-tissue]
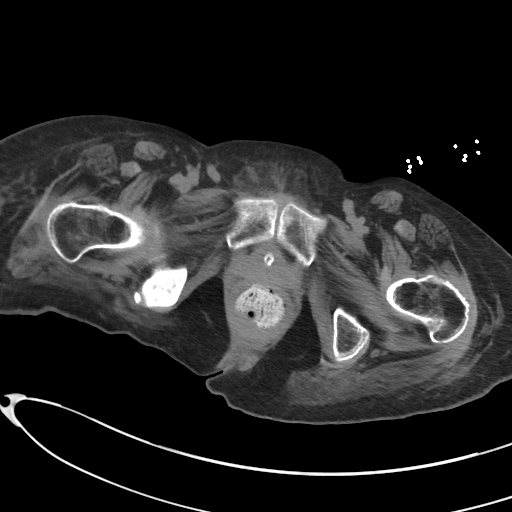
[im 5/83  bone]
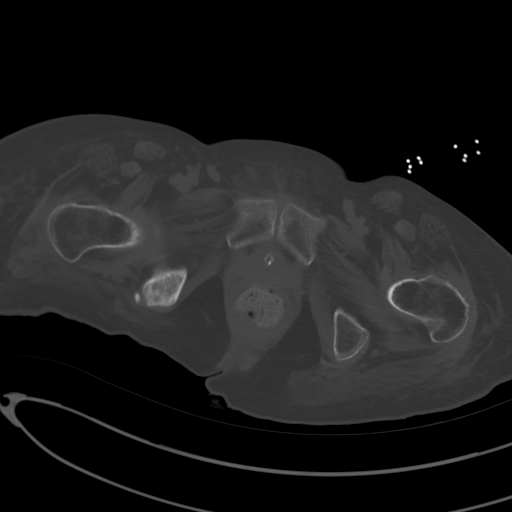
[im 13/83  soft-tissue]
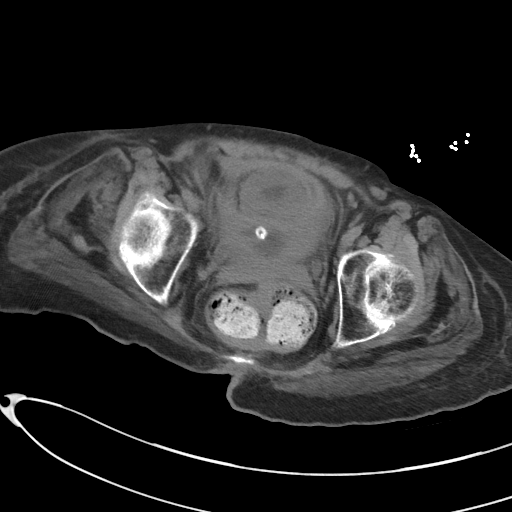
[im 18/83  soft-tissue]
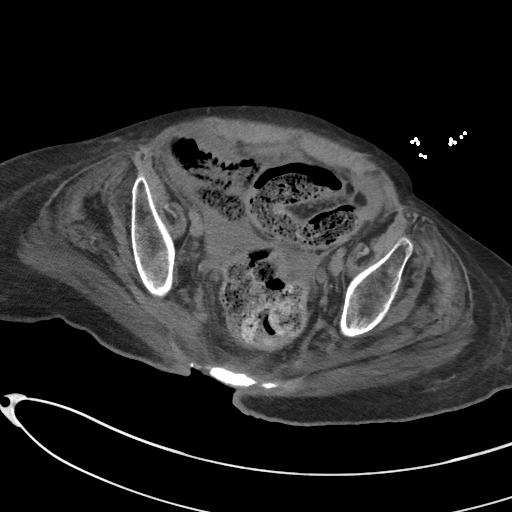
[im 26/83  soft-tissue]
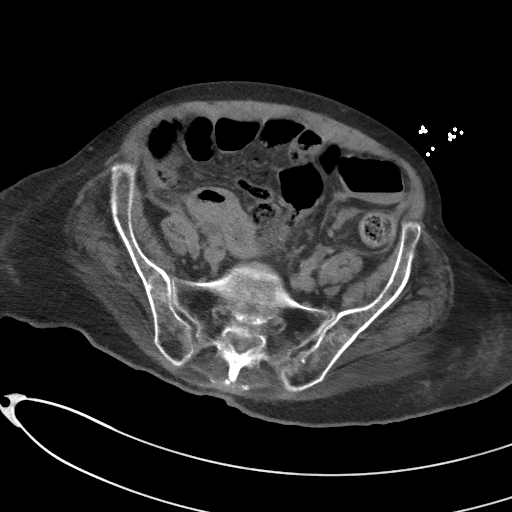
[im 31/83  soft-tissue]
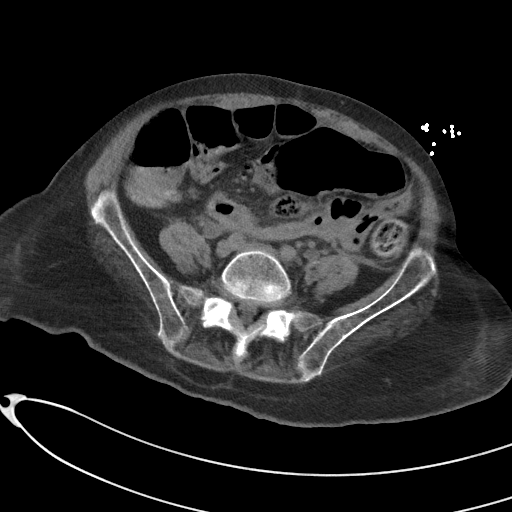
[im 39/83  soft-tissue]
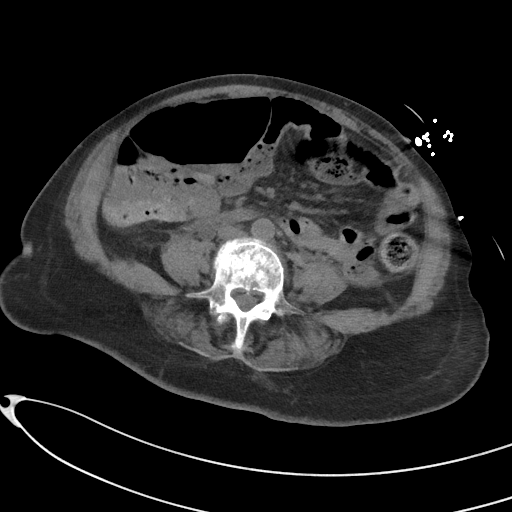
[im 44/83  soft-tissue]
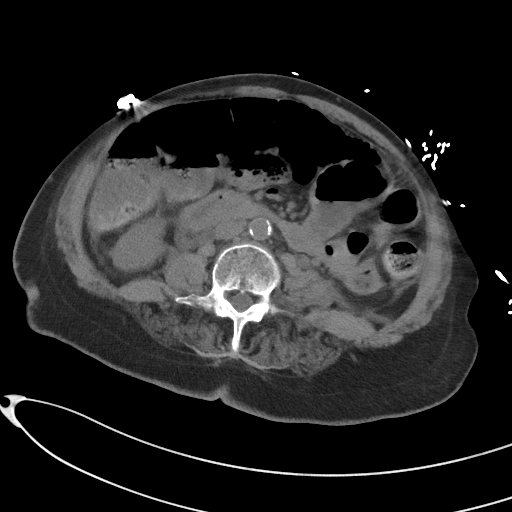
[im 52/83  soft-tissue]
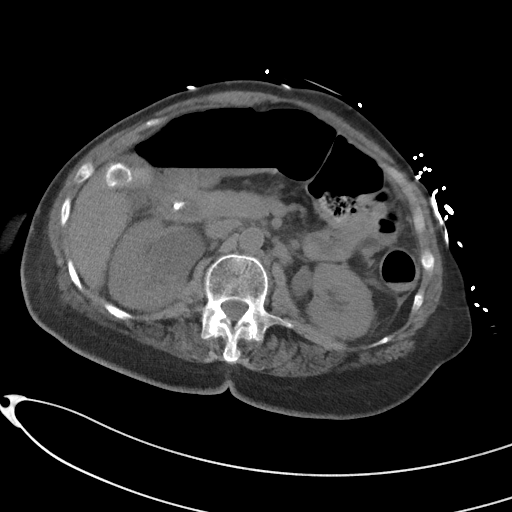
[im 57/83  soft-tissue]
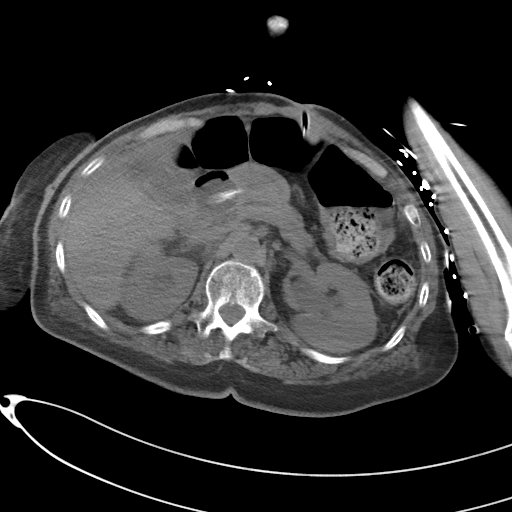
[im 57/83  bone]
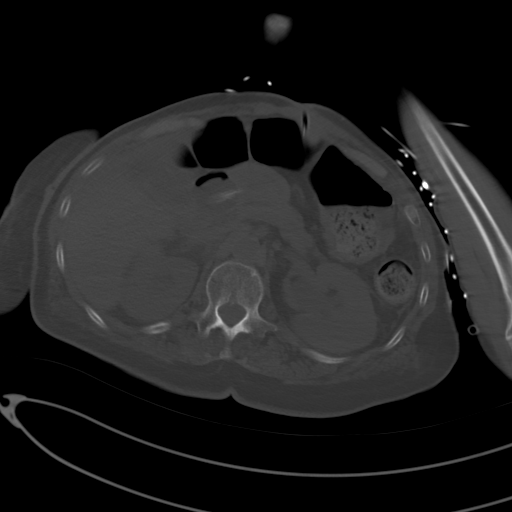
[im 65/83  soft-tissue]
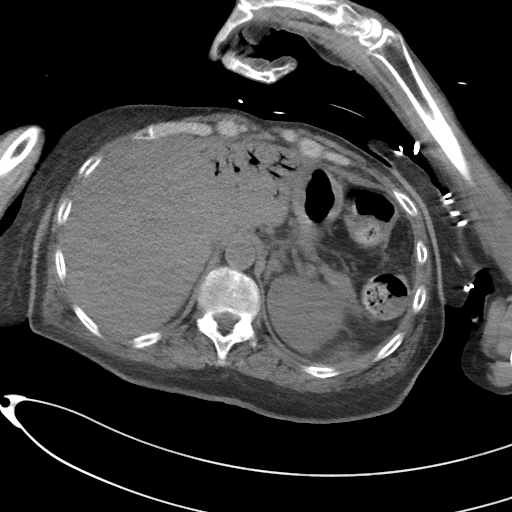
[im 70/83  soft-tissue]
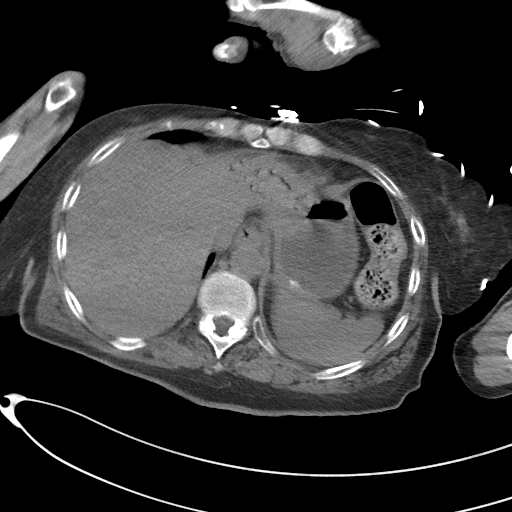
[im 78/83  soft-tissue]
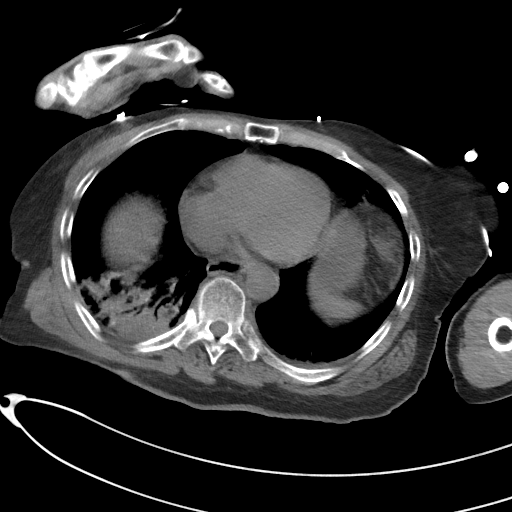

[Series 3: coronal st · coronal · 0.69mm/px · 3 of 90 slices shown]
[im 30/90  soft-tissue]
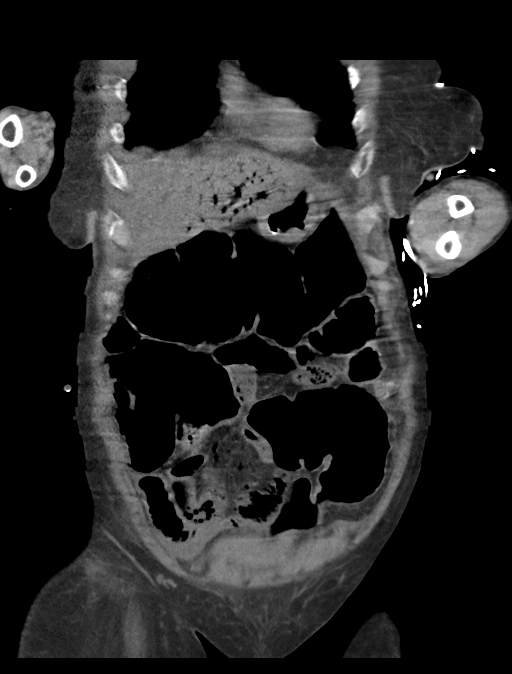
[im 40/90  soft-tissue]
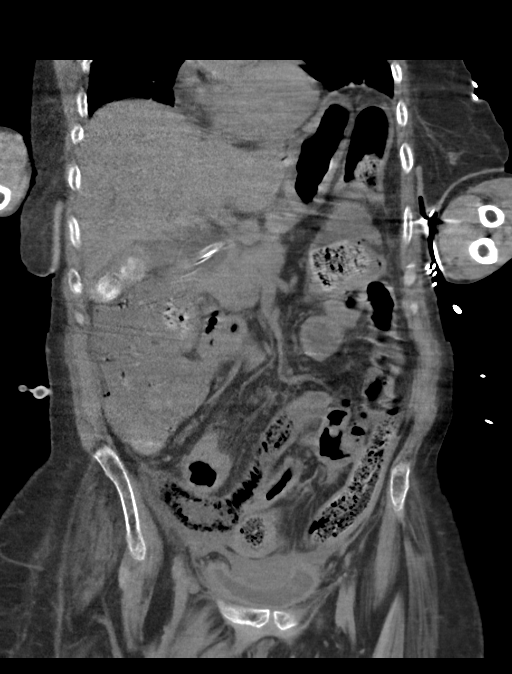
[im 50/90  soft-tissue]
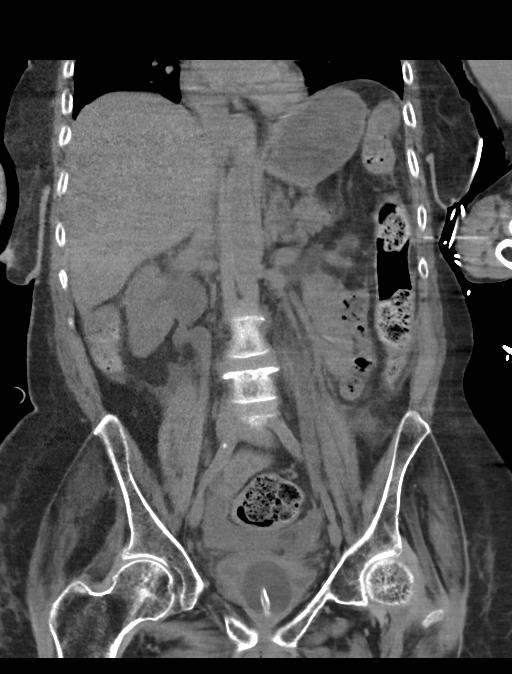

[15 of 46 positions shown; findings below may reference images not displayed]

FINDINGS: Lower chest: There is consolidation in the right lower lobe.
Although this may be all atelectasis, pneumonia is suspected. Milder
opacity in the left lung base is consistent with atelectasis. Heart
is normal in size.

Hepatobiliary: Portal venous air is seen throughout portal branches
in the left lobe. No liver mass or focal lesion. There are prominent
gallstones. No convincing gallbladder wall thickening. No bile duct
dilation.

Pancreas: Unremarkable. No pancreatic ductal dilatation or
surrounding inflammatory changes.

Spleen: Normal in size without focal abnormality.

Adrenals/Urinary Tract: No adrenal mass. There is marked right and
moderate left hydronephrosis and hydroureter. Ureters are distended
all the way to the bladder. No ureteral stones. Bladder is partly
decompressed by Foley catheter. No convincing bladder mass. Balloon
of the catheter lies directly adjacent to the luminal side of the
ureterovesicular junctions. No renal masses or stones.

Stomach/Bowel: Pneumatosis intestinalis is seen along the ileum
would air extending into portal venous branches colon is
mild-to-moderately distended most evident along the right colon. The
ascending colon measures 6 cm in diameter. There is increase colonic
and rectal stool. No colon pneumatosis intestinalis is evident. No
colonic wall thickening. A percutaneously placed gastrostomy tube
has its tip in the distal stomach.

Vascular/Lymphatic: Mild atherosclerotic calcifications noted along
a normal caliber abdominal aorta. No discrete enlarged lymph nodes.

Reproductive: Status post hysterectomy. No adnexal masses.

Other: Trace amount of ascites noted along the pericolic gutters and
in the posterior pelvic recess. No abdominal hernia.

Musculoskeletal: Compression fracture of L4, most likely old. There
is sclerosis along the visualized right ischium, as well as the
lower sacrum, which appears to be the result of previous
osteomyelitis. A soft tissue defect consistent with a decubitus
ulcer overlies the lower sacrum and coccyx. There is a defect along
the posterior margin of the right ilium also consistent with chronic
osteomyelitis.
IMPRESSION: 1. Pneumatosis intestinalis of the ileum with portal venous gas.
This is concerning for ischemic bowel although there is no
significant small bowel wall thickening.
2. Right lower lobe consolidation consistent with pneumonia.
3. Marked right and moderate left hydronephrosis and hydroureter. No
ureteral stone. No bladder mass. The Foley catheter balloon does
appear to compress the ureterovesicular junctions and may be the
cause of hydronephrosis.
4. Trace amount of ascites.
5. Moderate increased stool in the colon and rectum.
6. Gallstones.  No convincing acute cholecystitis.
7. Compression fracture of L4, most likely old. Chronic
osteomyelitis of the lower sacrum and coccyx and right ischium.
